# Patient Record
Sex: Male | Born: 1950 | Race: White | Hispanic: No | Marital: Married | Smoking: Current every day smoker
Health system: Southern US, Community
[De-identification: ages and names within clinical notes are randomized; demographics above are authoritative.]

## PROBLEM LIST (undated history)

## (undated) DIAGNOSIS — K279 Peptic ulcer, site unspecified, unspecified as acute or chronic, without hemorrhage or perforation: Secondary | ICD-10-CM

## (undated) DIAGNOSIS — Z8719 Personal history of other diseases of the digestive system: Secondary | ICD-10-CM

## (undated) DIAGNOSIS — E559 Vitamin D deficiency, unspecified: Secondary | ICD-10-CM

## (undated) DIAGNOSIS — I10 Essential (primary) hypertension: Secondary | ICD-10-CM

## (undated) DIAGNOSIS — J449 Chronic obstructive pulmonary disease, unspecified: Secondary | ICD-10-CM

## (undated) DIAGNOSIS — Z87442 Personal history of urinary calculi: Secondary | ICD-10-CM

## (undated) DIAGNOSIS — N200 Calculus of kidney: Secondary | ICD-10-CM

## (undated) DIAGNOSIS — C3490 Malignant neoplasm of unspecified part of unspecified bronchus or lung: Secondary | ICD-10-CM

## (undated) DIAGNOSIS — B019 Varicella without complication: Secondary | ICD-10-CM

## (undated) HISTORY — DX: Essential (primary) hypertension: I10

## (undated) HISTORY — PX: LITHOTRIPSY: SUR834

## (undated) HISTORY — DX: Calculus of kidney: N20.0

## (undated) HISTORY — DX: Peptic ulcer, site unspecified, unspecified as acute or chronic, without hemorrhage or perforation: K27.9

## (undated) HISTORY — DX: Varicella without complication: B01.9

## (undated) HISTORY — DX: Personal history of urinary calculi: Z87.442

## (undated) HISTORY — DX: Personal history of other diseases of the digestive system: Z87.19

---

## 1994-03-08 HISTORY — PX: SPINAL CORD DECOMPRESSION: SHX97

## 2015-05-03 ENCOUNTER — Inpatient Hospital Stay
Admission: EM | Admit: 2015-05-03 | Discharge: 2015-05-05 | DRG: 178 | Disposition: A | Discharge status: Home / Self Care | Payer: PPO | Attending: Internal Medicine | Admitting: Internal Medicine

## 2015-05-03 ENCOUNTER — Emergency Department: Payer: PPO

## 2015-05-03 DIAGNOSIS — R042 Hemoptysis: Secondary | ICD-10-CM | POA: Diagnosis not present

## 2015-05-03 DIAGNOSIS — N289 Disorder of kidney and ureter, unspecified: Secondary | ICD-10-CM | POA: Diagnosis not present

## 2015-05-03 DIAGNOSIS — T380X5A Adverse effect of glucocorticoids and synthetic analogues, initial encounter: Secondary | ICD-10-CM | POA: Diagnosis present

## 2015-05-03 DIAGNOSIS — R739 Hyperglycemia, unspecified: Secondary | ICD-10-CM | POA: Diagnosis present

## 2015-05-03 DIAGNOSIS — F17210 Nicotine dependence, cigarettes, uncomplicated: Secondary | ICD-10-CM | POA: Diagnosis not present

## 2015-05-03 DIAGNOSIS — J690 Pneumonitis due to inhalation of food and vomit: Principal | ICD-10-CM | POA: Diagnosis present

## 2015-05-03 DIAGNOSIS — Z720 Tobacco use: Secondary | ICD-10-CM

## 2015-05-03 DIAGNOSIS — J441 Chronic obstructive pulmonary disease with (acute) exacerbation: Secondary | ICD-10-CM | POA: Diagnosis not present

## 2015-05-03 DIAGNOSIS — J189 Pneumonia, unspecified organism: Secondary | ICD-10-CM | POA: Diagnosis not present

## 2015-05-03 DIAGNOSIS — I10 Essential (primary) hypertension: Secondary | ICD-10-CM | POA: Diagnosis not present

## 2015-05-03 DIAGNOSIS — R0902 Hypoxemia: Secondary | ICD-10-CM | POA: Diagnosis not present

## 2015-05-03 DIAGNOSIS — J440 Chronic obstructive pulmonary disease with acute lower respiratory infection: Secondary | ICD-10-CM | POA: Diagnosis not present

## 2015-05-03 DIAGNOSIS — R58 Hemorrhage, not elsewhere classified: Secondary | ICD-10-CM | POA: Diagnosis not present

## 2015-05-03 HISTORY — DX: Calculus of kidney: N20.0

## 2015-05-03 LAB — CBC
HEMATOCRIT: 46.2 % (ref 40.0–52.0)
Hemoglobin: 15.4 g/dL (ref 13.0–18.0)
MCH: 31.9 pg (ref 26.0–34.0)
MCHC: 33.4 g/dL (ref 32.0–36.0)
MCV: 95.5 fL (ref 80.0–100.0)
Platelets: 162 10*3/uL (ref 150–440)
RBC: 4.83 MIL/uL (ref 4.40–5.90)
RDW: 14.6 % — AB (ref 11.5–14.5)
WBC: 8.3 10*3/uL (ref 3.8–10.6)

## 2015-05-03 LAB — COMPREHENSIVE METABOLIC PANEL
ALBUMIN: 3.7 g/dL (ref 3.5–5.0)
ALT: 8 U/L — ABNORMAL LOW (ref 17–63)
AST: 15 U/L (ref 15–41)
Alkaline Phosphatase: 99 U/L (ref 38–126)
Anion gap: 5 (ref 5–15)
BILIRUBIN TOTAL: 0.1 mg/dL — AB (ref 0.3–1.2)
BUN: 14 mg/dL (ref 6–20)
CHLORIDE: 103 mmol/L (ref 101–111)
CO2: 31 mmol/L (ref 22–32)
Calcium: 8.8 mg/dL — ABNORMAL LOW (ref 8.9–10.3)
Creatinine, Ser: 1.25 mg/dL — ABNORMAL HIGH (ref 0.61–1.24)
GFR calc Af Amer: >60 mL/min (ref >60)
GFR calc non Af Amer: 58 mL/min — ABNORMAL LOW (ref >60)
GLUCOSE: 136 mg/dL — AB (ref 65–99)
POTASSIUM: 4.1 mmol/L (ref 3.5–5.1)
SODIUM: 139 mmol/L (ref 135–145)
Total Protein: 7.4 g/dL (ref 6.5–8.1)

## 2015-05-03 MED ORDER — PIPERACILLIN-TAZOBACTAM 3.375 G IVPB
3.3750 g | Freq: Once | INTRAVENOUS | Status: AC
Start: 2015-05-03 — End: 2015-05-04
  Administered 2015-05-03: 3.375 g via INTRAVENOUS
  Filled 2015-05-03: qty 50

## 2015-05-03 MED ORDER — LABETALOL HCL 5 MG/ML IV SOLN
20.0000 mg | Freq: Once | INTRAVENOUS | Status: AC
  Administered 2015-05-03: 20 mg via INTRAVENOUS
  Filled 2015-05-03: qty 4

## 2015-05-03 MED ORDER — IOHEXOL 350 MG/ML SOLN
75.0000 mL | Freq: Once | INTRAVENOUS | Status: AC | PRN
  Administered 2015-05-03: 75 mL via INTRAVENOUS

## 2015-05-03 NOTE — ED Provider Notes (Signed)
Time Seen: Approximately ----------------------------------------- 7:50 PM on 05/03/2015 -----------------------------------------   I have reviewed the triage notes  Chief Complaint: Hemoptysis   History of Present Illness: Kevin Gutierrez is a 65 y.o. male who had acute onset of hemoptysis today. Patient states approximately one hour prior to arrival he had a coughing spell and coughed up approximately half a cup of clotted blood. Patient denies any chest pain or shortness of breath. He states he's been battling bronchitis on and off for the last several months. Patient denies any fever or chills back or flank discomfort. Patient denies any feelings of lightheadedness, melena or hematochezia. He is currently not on any blood thinners and has really no chronic medical issues. States he was evaluated in another emergency department approximately a year ago and at that time did not have a hemoptysis no persistent bronchitis. She had a CT at that time which was negative and has had negative chest x-rays for any intra-abdominal thoracic tumors etc. He denies any unusual rashes or lesions.   No past medical history on file.  There are no active problems to display for this patient.   No past surgical history on file.  No past surgical history on file.  No current outpatient prescriptions on file.  Allergies:  Review of patient's allergies indicates no known allergies.  Family History: No family history on file.  Social History: Social History  Substance Use Topics  . Smoking status: Not on file  . Smokeless tobacco: Not on file  . Alcohol Use: Not on file     Review of Systems:   10 point review of systems was performed and was otherwise negative:  Constitutional: No fever Eyes: No visual disturbances ENT: No sore throat, ear pain Cardiac: No chest pain Respiratory: No shortness of breath, wheezing, or stridor Abdomen: No abdominal pain, no vomiting, No  diarrhea Endocrine: No weight loss, No night sweats Extremities: No peripheral edema, cyanosis Skin: No rashes, easy bruising Neurologic: No focal weakness, trouble with speech or swollowing Urologic: No dysuria, Hematuria, or urinary frequency   Physical Exam:  ED Triage Vitals  Enc Vitals Group     BP 05/03/15 1907 175/100 mmHg     Pulse Rate 05/03/15 1907 86     Resp 05/03/15 1907 18     Temp 05/03/15 1907 97.8 F (36.6 C)     Temp Source 05/03/15 1907 Oral     SpO2 05/03/15 1907 91 %     Weight 05/03/15 1907 165 lb (74.844 kg)     Height 05/03/15 1907 '5\' 11"'$  (1.803 m)     Head Cir --      Peak Flow --      Pain Score --      Pain Loc --      Pain Edu? --      Excl. in [place]? --     General: Awake , Alert , and Oriented times 3; GCS 15 Head: Normal cephalic , atraumatic Eyes: Pupils equal , round, reactive to light Nose/Throat: No nasal drainage, patent upper airway without erythema or exudate.  Neck: Supple, Full range of motion, No anterior adenopathy or palpable thyroid masses Lungs: Clear to ascultation without wheezes , rhonchi, or rales Heart: Regular rate, regular rhythm without murmurs , gallops , or rubs Abdomen: Soft, non tender without rebound, guarding , or rigidity; bowel sounds positive and symmetric in all 4 quadrants. No organomegaly .        Extremities: 2 plus symmetric pulses.  No edema, clubbing or cyanosis Neurologic: normal ambulation, Motor symmetric without deficits, sensory intact Skin: warm, dry, no rashes   Labs:   All laboratory work was reviewed including any pertinent negatives or positives listed below:  Labs Reviewed  CBC - Abnormal; Notable for the following:    RDW 14.6 (*)    All other components within normal limits  COMPREHENSIVE METABOLIC PANEL      Radiology:      EXAM: CT ANGIOGRAPHY CHEST WITH CONTRAST  TECHNIQUE: Multidetector CT imaging of the chest was performed using the standard protocol during bolus  administration of intravenous contrast. Multiplanar CT image reconstructions and MIPs were obtained to evaluate the vascular anatomy.  CONTRAST: 70m OMNIPAQUE IOHEXOL 350 MG/ML SOLN  COMPARISON: Chest radiograph June 04, 2014  FINDINGS: PULMONARY ARTERY: Adequate contrast opacification of the pulmonary artery's. Main pulmonary artery is not enlarged. No pulmonary arterial filling defects to the level of the subsegmental branches.  MEDIASTINUM: Heart heart size appears normal, no right heart strain. Anterior car deformity due to pectus excavatum. No pericardial effusions. Thoracic aorta is normal course and caliber, mild calcific atherosclerosis. No lymphadenopathy by CT size criteria. 9 mm prevascular lymph node with reniform morphology.  LUNGS: Small amount of debris within the RIGHT lower lobe bronchus. No pneumothorax. Mild bronchial wall thickening. Patchy hypo enhancing consolidation RIGHT middle lobe. Enhancing atelectasis and bilateral lower lobes. Faint central lobular ground-glass densities bilateral lower lobes lobe. Mild centrilobular emphysema. No pleural effusion.  SOFT TISSUES AND OSSEOUS STRUCTURES: Included view of the abdomen is unremarkable. Visualized soft tissues and included osseous structures appear normal.  Review of the MIP images confirms the above findings.  IMPRESSION: No acute pulmonary embolism.  Bronchial wall thickening can be seen with reactive airway disease or bronchitis. Small amount of debris in RIGHT lower lobe bronchus, possible aspiration.  RIGHT middle lobe probable pneumonia, with bilateral lower lobe ground-glass densities which may be infectious or inflammatory.    I personally reviewed the radiologic studies     ED Course:  \Patient had a repeat episode of coughing up approximately a handful of clotted blood with hemoptysis. Patient's also moderately hypoxic and was started on some nonrebreather. His sats have  improved gradually. His chest CT does not show any indications of a pulmonary embolism or cancer which would be of concern with the patient's tobacco history. He does appear to have some indications of community-acquired pneumonia so blood cultures 2 was obtained the patient was started on IV antibiotic therapy. May require bronchoscopy to find source of his hemoptysis. He is otherwise hemodynamically stable    Assessment: * Hemoptysis Community-acquired pneumonia Hypoxemia   Final Clinical Impression:  Final diagnoses:  Hemoptysis     Plan:  Inpatient management I spoke to the hospitalist team, further disposition and management depends upon their evaluation.           BDaymon Larsen MD 05/03/15 2217

## 2015-05-03 NOTE — ED Notes (Signed)
Pt presents to ED via EMS with c/o of coughing up blood and "lots of sneezing". Pt states it was a handful of blood over a 15 minutes period."it was red and clots".

## 2015-05-03 NOTE — H&P (Signed)
[facility] [place] NAME: Kevin Gutierrez    MR#:  [id number]  DATE OF BIRTH:  1950/05/22  DATE OF ADMISSION:  05/03/2015  PRIMARY CARE PHYSICIAN: No primary care provider on file.   REQUESTING/REFERRING PHYSICIAN:   CHIEF COMPLAINT:   Chief Complaint  Patient presents with  . Hemoptysis    HISTORY OF PRESENT ILLNESS: Kevin Gutierrez  is a 65 y.o. male with a known history of nephrolithiasis presented to the emergency room with difficulty breathing and coughing of blood. The above complaint started today. She had 3 episodes of coughing up blood. It was bright red blood. Patient also complains of having cough for the last 1-2 weeks. No history of any fever. No history of any night sweats. This patient does not say whether he had any weight loss. Patient is an active smoker for most 1 pack a day for the last 50 years. No complaints of any chest pain. Vision was put on oxygen via nonrebreather mask in the emergency room. Was worked up with a CT chest which showed  bilateral pneumonia and groundglass densities. No history of any travel. Patient says he did not have any history of TB in the past. Hospitalist service was consulted for further care of the patient.  PAST MEDICAL HISTORY:   Past Medical History  Diagnosis Date  . Nephrolithiasis     PAST SURGICAL HISTORY: Past Surgical History  Procedure Laterality Date  . Lithotripsy      SOCIAL HISTORY:  Social History  Substance Use Topics  . Smoking status: Current Every Day Smoker -- 1.00 packs/day for 50 years    Types: Cigarettes  . Smokeless tobacco: Not on file  . Alcohol Use: No    FAMILY HISTORY:  Family History  Problem Relation Age of Onset  . Diabetes Mellitus II Brother     DRUG ALLERGIES: No Known Allergies  REVIEW OF SYSTEMS:   CONSTITUTIONAL: No fever, fatigue or weakness.  EYES: No blurred or double vision.  EARS, NOSE, AND THROAT: No tinnitus or ear pain.   RESPIRATORY: cough present, shortness of breath present, no wheezing , has hemoptysis.  CARDIOVASCULAR: No chest pain, orthopnea, edema.  GASTROINTESTINAL: No nausea, vomiting, diarrhea or abdominal pain.  GENITOURINARY: No dysuria, hematuria.  ENDOCRINE: No polyuria, nocturia,  HEMATOLOGY: No anemia, easy bruising or bleeding SKIN: No rash or lesion. MUSCULOSKELETAL: No joint pain or arthritis.   NEUROLOGIC: No tingling, numbness, weakness.  PSYCHIATRY: No anxiety or depression.   MEDICATIONS AT HOME:  Prior to Admission medications   Medication Sig Start Date End Date Taking? Authorizing Provider  acetaminophen (TYLENOL) 325 MG tablet Take 650 mg by mouth every 6 (six) hours as needed for moderate pain, fever or headache.   Yes Historical Provider, MD      PHYSICAL EXAMINATION:   VITAL SIGNS: Blood pressure 166/82, pulse 79, temperature 97.8 F (36.6 C), temperature source Oral, resp. rate 23, height '5\' 11"'$  (1.803 m), weight 74.844 kg (165 lb), SpO2 98 %.  GENERAL:  65 y.o.-year-old patient lying in the bed in mild distress. EYES: Pupils equal, round, reactive to light and accommodation. No scleral icterus. Extraocular muscles intact.  HEENT: Head atraumatic, normocephalic. Oropharynx and nasopharynx clear.  NECK:  Supple, no jugular venous distention. No thyroid enlargement, no tenderness.  LUNGS: decreased breath sounds bilaterally, no wheezing, scattered rales heard in both lungs,no rhonchi or crepitation. No use of accessory muscles of respiration.  CARDIOVASCULAR: S1, S2 normal. No  murmurs, rubs, or gallops.  ABDOMEN: Soft, nontender, nondistended. Bowel sounds present. No organomegaly or mass.  EXTREMITIES: No pedal edema, cyanosis, or clubbing.  NEUROLOGIC: Cranial nerves II through XII are intact. Muscle strength 5/5 in all extremities. Sensation intact. Gait not checked.  PSYCHIATRIC: The patient is alert and oriented x 3.  SKIN: No obvious rash, lesion, or ulcer.    LABORATORY PANEL:   CBC  Recent Labs Lab 05/03/15 1912  WBC 8.3  HGB 15.4  HCT 46.2  PLT 162  MCV 95.5  MCH 31.9  MCHC 33.4  RDW 14.6*   ------------------------------------------------------------------------------------------------------------------  Chemistries   Recent Labs Lab 05/03/15 1912  NA 139  K 4.1  CL 103  CO2 31  GLUCOSE 136*  BUN 14  CREATININE 1.25*  CALCIUM 8.8*  AST 15  ALT 8*  ALKPHOS 99  BILITOT 0.1*   ------------------------------------------------------------------------------------------------------------------ estimated creatinine clearance is 61.5 mL/min (by C-G formula based on Cr of 1.25). ------------------------------------------------------------------------------------------------------------------ No results for input(s): TSH, T4TOTAL, T3FREE, THYROIDAB in the last 72 hours.  Invalid input(s): FREET3   Coagulation profile No results for input(s): INR, PROTIME in the last 168 hours. ------------------------------------------------------------------------------------------------------------------- No results for input(s): DDIMER in the last 72 hours. -------------------------------------------------------------------------------------------------------------------  Cardiac Enzymes No results for input(s): CKMB, TROPONINI, MYOGLOBIN in the last 168 hours.  Invalid input(s): CK ------------------------------------------------------------------------------------------------------------------ Invalid input(s): POCBNP  ---------------------------------------------------------------------------------------------------------------  Urinalysis No results found for: COLORURINE, APPEARANCEUR, LABSPEC, PHURINE, GLUCOSEU, HGBUR, BILIRUBINUR, KETONESUR, PROTEINUR, UROBILINOGEN, NITRITE, LEUKOCYTESUR   RADIOLOGY: Ct Angio Chest Pe W/cm &/or Wo Cm  05/03/2015  CLINICAL DATA:  Hemoptysis, sneezing. Recent upper respiratory tract  infection. EXAM: CT ANGIOGRAPHY CHEST WITH CONTRAST TECHNIQUE: Multidetector CT imaging of the chest was performed using the standard protocol during bolus administration of intravenous contrast. Multiplanar CT image reconstructions and MIPs were obtained to evaluate the vascular anatomy. CONTRAST:  34m OMNIPAQUE IOHEXOL 350 MG/ML SOLN COMPARISON:  Chest radiograph June 04, 2014 FINDINGS: PULMONARY ARTERY: Adequate contrast opacification of the pulmonary artery's. Main pulmonary artery is not enlarged. No pulmonary arterial filling defects to the level of the subsegmental branches. MEDIASTINUM: Heart heart size appears normal, no right heart strain. Anterior car deformity due to pectus excavatum. No pericardial effusions. Thoracic aorta is normal course and caliber, mild calcific atherosclerosis. No lymphadenopathy by CT size criteria. 9 mm prevascular lymph node with reniform morphology. LUNGS: Small amount of debris within the RIGHT lower lobe bronchus. No pneumothorax. Mild bronchial wall thickening. Patchy hypo enhancing consolidation RIGHT middle lobe. Enhancing atelectasis and bilateral lower lobes. Faint central lobular ground-glass densities bilateral lower lobes lobe. Mild centrilobular emphysema. No pleural effusion. SOFT TISSUES AND OSSEOUS STRUCTURES: Included view of the abdomen is unremarkable. Visualized soft tissues and included osseous structures appear normal. Review of the MIP images confirms the above findings. IMPRESSION: No acute pulmonary embolism. Bronchial wall thickening can be seen with reactive airway disease or bronchitis. Small amount of debris in RIGHT lower lobe bronchus, possible aspiration. RIGHT middle lobe probable pneumonia, with bilateral lower lobe ground-glass densities which may be infectious or inflammatory. Electronically Signed   By: CElon AlasM.D.   On: 05/03/2015 21:46    EKG: Orders placed or performed during the hospital encounter of 05/03/15  . ED  EKG  . ED EKG    IMPRESSION AND PLAN: 65year old male patient with history of nephrolithiasis presented to the emergency room with coughing of blood and shortness of breath. Admitting diagnosis 1. Hemoptysis 2. Aspiration pneumonia 3. Hypoxia 4. Active tobacco abuse 5.  Bilateral pneumonia Treatment plan Admit patient to stepdown unit Start patient on IV vancomycin and IV Zosyn antibiotics Oxygen supply by nonrebreather mask Pulmonology consult Sputum for AFB 3 Air bone isolation Hold blood thinner meds.  All the records are reviewed and case discussed with ED provider. Management plans discussed with the patient, family and they are in agreement.  CODE STATUS:FULL    TOTAL TIME TAKING CARE OF THIS PATIENT: 48 minutes.    Saundra Shelling M.D on 05/03/2015 at 11:53 PM  Between 7am to 6pm - Pager - [phone]  After 6pm go to [email or web address] - password [email or web address] [facility]  [facility]  Office  [phone]  CC: Primary care physician; No primary care provider on file.

## 2015-05-03 NOTE — ED Notes (Signed)
Pt placed on airborne precautions.

## 2015-05-03 NOTE — ED Notes (Signed)
Patient transported to CT 

## 2015-05-04 DIAGNOSIS — R042 Hemoptysis: Secondary | ICD-10-CM | POA: Diagnosis not present

## 2015-05-04 DIAGNOSIS — J690 Pneumonitis due to inhalation of food and vomit: Secondary | ICD-10-CM | POA: Diagnosis not present

## 2015-05-04 DIAGNOSIS — J189 Pneumonia, unspecified organism: Secondary | ICD-10-CM | POA: Diagnosis not present

## 2015-05-04 DIAGNOSIS — J441 Chronic obstructive pulmonary disease with (acute) exacerbation: Secondary | ICD-10-CM

## 2015-05-04 DIAGNOSIS — N289 Disorder of kidney and ureter, unspecified: Secondary | ICD-10-CM | POA: Diagnosis not present

## 2015-05-04 DIAGNOSIS — Z720 Tobacco use: Secondary | ICD-10-CM | POA: Diagnosis not present

## 2015-05-04 LAB — URINALYSIS COMPLETE WITH MICROSCOPIC (ARMC ONLY)
BACTERIA UA: NONE SEEN
Bilirubin Urine: NEGATIVE
Glucose, UA: NEGATIVE mg/dL
KETONES UR: NEGATIVE mg/dL
Leukocytes, UA: NEGATIVE
Nitrite: NEGATIVE
PH: 5 (ref 5.0–8.0)
PROTEIN: NEGATIVE mg/dL
SPECIFIC GRAVITY, URINE: 1.011 (ref 1.005–1.030)
Squamous Epithelial / LPF: NONE SEEN

## 2015-05-04 LAB — CBC
HCT: 43.8 % (ref 40.0–52.0)
HEMOGLOBIN: 14.4 g/dL (ref 13.0–18.0)
MCH: 31.1 pg (ref 26.0–34.0)
MCHC: 32.9 g/dL (ref 32.0–36.0)
MCV: 94.5 fL (ref 80.0–100.0)
PLATELETS: 166 10*3/uL (ref 150–440)
RBC: 4.63 MIL/uL (ref 4.40–5.90)
RDW: 14.3 % (ref 11.5–14.5)
WBC: 9 10*3/uL (ref 3.8–10.6)

## 2015-05-04 LAB — EXPECTORATED SPUTUM ASSESSMENT W REFEX TO RESP CULTURE

## 2015-05-04 LAB — BASIC METABOLIC PANEL
ANION GAP: 9 (ref 5–15)
BUN: 17 mg/dL (ref 6–20)
CALCIUM: 8.8 mg/dL — AB (ref 8.9–10.3)
CO2: 29 mmol/L (ref 22–32)
CREATININE: 1.31 mg/dL — AB (ref 0.61–1.24)
Chloride: 101 mmol/L (ref 101–111)
GFR, EST NON AFRICAN AMERICAN: 55 mL/min — AB (ref >60)
Glucose, Bld: 209 mg/dL — ABNORMAL HIGH (ref 65–99)
Potassium: 4.5 mmol/L (ref 3.5–5.1)
SODIUM: 139 mmol/L (ref 135–145)

## 2015-05-04 LAB — EXPECTORATED SPUTUM ASSESSMENT W GRAM STAIN, RFLX TO RESP C

## 2015-05-04 LAB — PROTIME-INR
INR: 0.99
PROTHROMBIN TIME: 13.3 s (ref 11.4–15.0)

## 2015-05-04 LAB — MRSA PCR SCREENING: MRSA by PCR: NEGATIVE

## 2015-05-04 LAB — GLUCOSE, CAPILLARY: GLUCOSE-CAPILLARY: 144 mg/dL — AB (ref 65–99)

## 2015-05-04 LAB — HEMOGLOBIN A1C: Hgb A1c MFr Bld: 5.8 % (ref 4.0–6.0)

## 2015-05-04 MED ORDER — SODIUM CHLORIDE 0.9 % IV SOLN
INTRAVENOUS | Status: DC
  Administered 2015-05-04 – 2015-05-05 (×2): via INTRAVENOUS

## 2015-05-04 MED ORDER — AZITHROMYCIN 250 MG PO TABS
500.0000 mg | ORAL_TABLET | Freq: Every day | ORAL | Status: AC
  Administered 2015-05-04: 500 mg via ORAL
  Filled 2015-05-04: qty 2

## 2015-05-04 MED ORDER — ALBUTEROL SULFATE HFA 108 (90 BASE) MCG/ACT IN AERS: 2.0000 | INHALATION_SPRAY | RESPIRATORY_TRACT | Status: DC | PRN

## 2015-05-04 MED ORDER — TIOTROPIUM BROMIDE MONOHYDRATE 18 MCG IN CAPS
18.0000 ug | ORAL_CAPSULE | Freq: Every day | RESPIRATORY_TRACT | Status: DC
  Administered 2015-05-04 – 2015-05-05 (×2): 18 ug via RESPIRATORY_TRACT
  Filled 2015-05-04: qty 5

## 2015-05-04 MED ORDER — VANCOMYCIN HCL IN DEXTROSE 1-5 GM/200ML-% IV SOLN
1000.0000 mg | Freq: Two times a day (BID) | INTRAVENOUS | Status: DC
  Administered 2015-05-04: 1000 mg via INTRAVENOUS
  Filled 2015-05-04 (×3): qty 200

## 2015-05-04 MED ORDER — VANCOMYCIN HCL IN DEXTROSE 1-5 GM/200ML-% IV SOLN
INTRAVENOUS | Status: AC
  Administered 2015-05-04: 1000 mg via INTRAVENOUS
  Filled 2015-05-04: qty 200

## 2015-05-04 MED ORDER — PREDNISONE 50 MG PO TABS
50.0000 mg | ORAL_TABLET | Freq: Every day | ORAL | Status: DC
  Administered 2015-05-05: 50 mg via ORAL
  Filled 2015-05-04: qty 1

## 2015-05-04 MED ORDER — PIPERACILLIN-TAZOBACTAM 3.375 G IVPB
3.3750 g | Freq: Three times a day (TID) | INTRAVENOUS | Status: DC
Start: 2015-05-04 — End: 2015-05-04
  Administered 2015-05-04: 3.375 g via INTRAVENOUS
  Filled 2015-05-04 (×3): qty 50

## 2015-05-04 MED ORDER — DEXTROSE 5 % IV SOLN
1.0000 g | INTRAVENOUS | Status: DC
  Administered 2015-05-04 – 2015-05-05 (×2): 1 g via INTRAVENOUS
  Filled 2015-05-04 (×2): qty 10

## 2015-05-04 MED ORDER — DEXTROSE-NACL 5-0.9 % IV SOLN
INTRAVENOUS | Status: DC
  Administered 2015-05-04: 05:00:00 via INTRAVENOUS

## 2015-05-04 MED ORDER — ONDANSETRON HCL 4 MG/2ML IJ SOLN: 4.0000 mg | Freq: Four times a day (QID) | INTRAMUSCULAR | Status: DC | PRN

## 2015-05-04 MED ORDER — MOMETASONE FURO-FORMOTEROL FUM 200-5 MCG/ACT IN AERO
2.0000 | INHALATION_SPRAY | Freq: Two times a day (BID) | RESPIRATORY_TRACT | Status: DC
  Administered 2015-05-04 – 2015-05-05 (×3): 2 via RESPIRATORY_TRACT
  Filled 2015-05-04: qty 8.8

## 2015-05-04 MED ORDER — AZITHROMYCIN 250 MG PO TABS
250.0000 mg | ORAL_TABLET | Freq: Every day | ORAL | Status: DC
  Administered 2015-05-05: 250 mg via ORAL
  Filled 2015-05-04: qty 1

## 2015-05-04 MED ORDER — ACETAMINOPHEN 650 MG RE SUPP: 650.0000 mg | Freq: Four times a day (QID) | RECTAL | Status: DC | PRN

## 2015-05-04 MED ORDER — VANCOMYCIN HCL IN DEXTROSE 1-5 GM/200ML-% IV SOLN
1000.0000 mg | Freq: Once | INTRAVENOUS | Status: AC
  Administered 2015-05-04: 1000 mg via INTRAVENOUS

## 2015-05-04 MED ORDER — ACETAMINOPHEN 325 MG PO TABS: 650.0000 mg | ORAL_TABLET | Freq: Four times a day (QID) | ORAL | Status: DC | PRN

## 2015-05-04 MED ORDER — AMLODIPINE BESYLATE 5 MG PO TABS
5.0000 mg | ORAL_TABLET | Freq: Every day | ORAL | Status: DC
  Administered 2015-05-04 – 2015-05-05 (×2): 5 mg via ORAL
  Filled 2015-05-04 (×2): qty 1

## 2015-05-04 MED ORDER — ALBUTEROL SULFATE (2.5 MG/3ML) 0.083% IN NEBU: 2.5000 mg | INHALATION_SOLUTION | RESPIRATORY_TRACT | Status: DC | PRN

## 2015-05-04 MED ORDER — SODIUM CHLORIDE 0.9 % IJ SOLN
3.0000 mL | Freq: Two times a day (BID) | INTRAMUSCULAR | Status: DC
  Administered 2015-05-04 – 2015-05-05 (×4): 3 mL via INTRAVENOUS

## 2015-05-04 MED ORDER — PREDNISONE 20 MG PO TABS
20.0000 mg | ORAL_TABLET | Freq: Every day | ORAL | Status: DC
  Administered 2015-05-04: 12:00:00 20 mg via ORAL
  Filled 2015-05-04: qty 1

## 2015-05-04 MED ORDER — ONDANSETRON HCL 4 MG PO TABS: 4.0000 mg | ORAL_TABLET | Freq: Four times a day (QID) | ORAL | Status: DC | PRN

## 2015-05-04 NOTE — Progress Notes (Signed)
ANTIBIOTIC CONSULT NOTE - INITIAL  Pharmacy Consult for vancomycin and Zosyn dosing Indication: pneumonia  No Known Allergies  Patient Measurements: Height: '5\' 11"'$  (180.3 cm) Weight: 165 lb (74.844 kg) IBW/kg (Calculated) : 75.3 Adjusted Body Weight: 74.8kg  Vital Signs: Temp: 98.6 F (37 C) (01/10 0335) Temp Source: Oral (01/10 0335) BP: 153/91 mmHg (01/10 0335) Pulse Rate: 68 (01/10 0335) Intake/Output from previous day: 01/09 0701 - 01/10 0700 In: 200 [IV Piggyback:200] Out: -  Intake/Output from this shift: Total I/O In: 200 [IV Piggyback:200] Out: -   Labs:  Recent Labs  05/03/15 1912  WBC 8.3  HGB 15.4  PLT 162  CREATININE 1.25*   Estimated Creatinine Clearance: 61.5 mL/min (by C-G formula based on Cr of 1.25). No results for input(s): VANCOTROUGH, VANCOPEAK, VANCORANDOM, GENTTROUGH, GENTPEAK, GENTRANDOM, TOBRATROUGH, TOBRAPEAK, TOBRARND, AMIKACINPEAK, AMIKACINTROU, AMIKACIN in the last 72 hours.   Microbiology: No results found for this or any previous visit (from the past 720 hour(s)).  Medical History: Past Medical History  Diagnosis Date  . Nephrolithiasis     Medications:   Assessment: Blood cx pending CT: probable R mid lobe pneumonia  Goal of Therapy:  Vancomycin trough level 15-20 mcg/ml  Plan:  TBW 74.8kg  IBW 75.3kg  DW 74.8k  Vd 52L kei 0.056 hr-1  T1/2 12 hours Vancomycin 1 gram q 12 hours ordered with stacked dosing. Level before 5th dose.  Zosyn 3.375 grams q 8 hours ordered.  Brazen Domangue S 05/04/2015,4:09 AM

## 2015-05-04 NOTE — Progress Notes (Signed)
[facility] [place] NAME: Kevin Gutierrez    MR#:  [id number]  DATE OF BIRTH:  [birth year]  SUBJECTIVE:  CHIEF COMPLAINT:   Chief Complaint  Patient presents with  . Hemoptysis   patient is a 65 year old Caucasian male with past nuchal history significant for history of COPD who presents to the hospital with complaints of cough, shortness of breath and hemoptysis which started yesterday. Patient still smokes. CT of the chest was performed which revealed pneumonia and patient was admitted to the hospital for further evaluation and treatment. He feels good today. Has no more hemoptysis. The patient has been receiving, seen and Zosyn as well as Zithromax for pneumonia  Review of Systems  Constitutional: Negative for fever, chills and weight loss.  HENT: Negative for congestion.   Eyes: Negative for blurred vision and double vision.  Respiratory: Positive for cough, hemoptysis, shortness of breath and wheezing. Negative for sputum production.   Cardiovascular: Negative for chest pain, palpitations, orthopnea, leg swelling and PND.  Gastrointestinal: Negative for nausea, vomiting, abdominal pain, diarrhea, constipation and blood in stool.  Genitourinary: Negative for dysuria, urgency, frequency and hematuria.  Musculoskeletal: Negative for falls.  Neurological: Negative for dizziness, tremors, focal weakness and headaches.  Endo/Heme/Allergies: Does not bruise/bleed easily.  Psychiatric/Behavioral: Negative for depression. The patient does not have insomnia.     VITAL SIGNS: Blood pressure 170/67, pulse 77, temperature 97.5 F (36.4 C), temperature source Oral, resp. rate 20, height '5\' 11"'$  (1.803 m), weight 74.844 kg (165 lb), SpO2 96 %.  PHYSICAL EXAMINATION:   GENERAL:  65 y.o.-year-old patient lying in the bed with no acute distress.  EYES: Pupils equal, round, reactive to light and accommodation. No scleral icterus. Extraocular muscles  intact.  HEENT: Head atraumatic, normocephalic. Oropharynx and nasopharynx clear.  NECK:  Supple, no jugular venous distention. No thyroid enlargement, no tenderness.  LUNGS: Diminished breath sounds bilaterally, scattered wheezing, rales,rhonchi or right side posterior crepitations. No use of accessory muscles of respiration.  CARDIOVASCULAR: S1, S2 normal. No murmurs, rubs, or gallops.  ABDOMEN: Soft, nontender, nondistended. Bowel sounds present. No organomegaly or mass.  EXTREMITIES: No pedal edema, cyanosis, or clubbing.  NEUROLOGIC: Cranial nerves II through XII are intact. Muscle strength 5/5 in all extremities. Sensation intact. Gait not checked.  PSYCHIATRIC: The patient is alert and oriented x 3.  SKIN: No obvious rash, lesion, or ulcer.   ORDERS/RESULTS REVIEWED:   CBC  Recent Labs Lab 05/03/15 1912 05/04/15 0922  WBC 8.3 9.0  HGB 15.4 14.4  HCT 46.2 43.8  PLT 162 166  MCV 95.5 94.5  MCH 31.9 31.1  MCHC 33.4 32.9  RDW 14.6* 14.3   ------------------------------------------------------------------------------------------------------------------  Chemistries   Recent Labs Lab 05/03/15 1912 05/04/15 0922  NA 139 139  K 4.1 4.5  CL 103 101  CO2 31 29  GLUCOSE 136* 209*  BUN 14 17  CREATININE 1.25* 1.31*  CALCIUM 8.8* 8.8*  AST 15  --   ALT 8*  --   ALKPHOS 99  --   BILITOT 0.1*  --    ------------------------------------------------------------------------------------------------------------------ estimated creatinine clearance is 58.7 mL/min (by C-G formula based on Cr of 1.31). ------------------------------------------------------------------------------------------------------------------ No results for input(s): TSH, T4TOTAL, T3FREE, THYROIDAB in the last 72 hours.  Invalid input(s): FREET3  Cardiac Enzymes No results for input(s): CKMB, TROPONINI, MYOGLOBIN in the last 168 hours.  Invalid input(s):  CK ------------------------------------------------------------------------------------------------------------------ Invalid input(s): POCBNP ---------------------------------------------------------------------------------------------------------------  RADIOLOGY: Ct Angio Chest Pe W/cm &/or  Wo Cm  05/03/2015  CLINICAL DATA:  Hemoptysis, sneezing. Recent upper respiratory tract infection. EXAM: CT ANGIOGRAPHY CHEST WITH CONTRAST TECHNIQUE: Multidetector CT imaging of the chest was performed using the standard protocol during bolus administration of intravenous contrast. Multiplanar CT image reconstructions and MIPs were obtained to evaluate the vascular anatomy. CONTRAST:  35m OMNIPAQUE IOHEXOL 350 MG/ML SOLN COMPARISON:  Chest radiograph June 04, 2014 FINDINGS: PULMONARY ARTERY: Adequate contrast opacification of the pulmonary artery's. Main pulmonary artery is not enlarged. No pulmonary arterial filling defects to the level of the subsegmental branches. MEDIASTINUM: Heart heart size appears normal, no right heart strain. Anterior car deformity due to pectus excavatum. No pericardial effusions. Thoracic aorta is normal course and caliber, mild calcific atherosclerosis. No lymphadenopathy by CT size criteria. 9 mm prevascular lymph node with reniform morphology. LUNGS: Small amount of debris within the RIGHT lower lobe bronchus. No pneumothorax. Mild bronchial wall thickening. Patchy hypo enhancing consolidation RIGHT middle lobe. Enhancing atelectasis and bilateral lower lobes. Faint central lobular ground-glass densities bilateral lower lobes lobe. Mild centrilobular emphysema. No pleural effusion. SOFT TISSUES AND OSSEOUS STRUCTURES: Included view of the abdomen is unremarkable. Visualized soft tissues and included osseous structures appear normal. Review of the MIP images confirms the above findings. IMPRESSION: No acute pulmonary embolism. Bronchial wall thickening can be seen with reactive airway  disease or bronchitis. Small amount of debris in RIGHT lower lobe bronchus, possible aspiration. RIGHT middle lobe probable pneumonia, with bilateral lower lobe ground-glass densities which may be infectious or inflammatory. Electronically Signed   By: CElon AlasM.D.   On: 05/03/2015 21:46    EKG:  Orders placed or performed during the hospital encounter of 05/03/15  . ED EKG  . ED EKG    ASSESSMENT AND PLAN:  Principal Problem:   Hemoptysis Active Problems:   Hypoxia   Aspiration pneumonia (HCC)   Pneumonia 1. COPD exacerbation due to pneumonia, continue patient on duo nebs , Dulera, broad-spectrum antibiotics, tiotropium, initiate steroids 3. Right middle lobe pneumonia with hemoptysis, continue antibiotics, follow sputum cultures, pulmonologist does not feel that patient has TB,  isolation has been discontinued 3. Renal insufficiency, seen to be stable, possibly chronic, initiated on low rate IV fluids, follow creatinine in the morning, get urinalysis 4. Hyperglycemia, likely due to steroids, get hemoglobin A1c to rule out diabetes 5. Tobacco abuse, well discuss  with patient . Nicotine replacement therapy to be initiated   Management plans discussed with the patient, family and they are in agreement.   DRUG ALLERGIES: No Known Allergies  CODE STATUS:     Code Status Orders        Start     Ordered   05/04/15 0341  Full code   Continuous     05/04/15 0340    Code Status History    Date Active Date Inactive Code Status Order ID Comments User Context   This patient has a current code status but no historical code status.      TOTAL TIME TAKING CARE OF THIS PATIENT: 40 minutes.    VTheodoro GristM.D on 05/04/2015 at 12:50 PM  Between 7am to 6pm - Pager - [phone]  After 6pm go to [email or web address] - password [email or web address] [facility]  Office  [phone] CC: Primary care physician; No primary care provider on file.

## 2015-05-04 NOTE — ED Notes (Signed)
Pt with steady gait to restroom. Pt denies any dizziness or lightheadedness.

## 2015-05-04 NOTE — Consult Note (Signed)
[facility] Consultation      Date: 05/04/2015,   MRN# [id number] Kevin Gutierrez 05/27/1950 Code Status:     Code Status Orders        Start     Ordered   05/04/15 0341  Full code   Continuous     05/04/15 0340     Hosp day:'@LENGTHOFSTAYDAYS'$ @ Referring MD: '@ATDPROV'$ @     PCP:      AdmissionWeight: 165 lb (74.844 kg)                 CurrentWeight: 165 lb (74.844 kg) Kevin Gutierrez is a 65 y.o. old male seen in consultation for hemoptysis the request of Dr Estanislado Pandy     CHIEF COMPLAINT:   Acute coughing up blood   HISTORY OF PRESENT ILLNESS  65 y.o. White male with a long standing smoking histroy 50 pack year, presented to the emergency room with difficulty breathing and coughing of blood approx 1 cup full. Started 5 PM yesterday after dinner. It was bright red blood.  -Patient also complains of having cough for the last 1-2 weeks. And actually dating back to March of last year.  -No history of any fever. No history of any night sweats. 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  - No complaints of any chest pain -he was placed on nonrebreather mask in the emergency room-now on 3L [place] 02sat 93% -. Was worked up with a CT chest which showed bilateral pneumonia and groundglass densities.  -No history of any travel - Patient says he did not have any history of TB in the past.    PAST MEDICAL HISTORY   Past Medical History  Diagnosis Date  . Nephrolithiasis      SURGICAL HISTORY   Past Surgical History  Procedure Laterality Date  . Lithotripsy       FAMILY HISTORY   Family History  Problem Relation Age of Onset  . Diabetes Mellitus II Brother      SOCIAL HISTORY   Social History  Substance Use Topics  . Smoking status: Current Every Day Smoker -- 1.00 packs/day for 50 years    Types: Cigarettes  . Smokeless tobacco: None  . Alcohol Use: No     MEDICATIONS    Home Medication:  No current  outpatient prescriptions on file.  Current Medication:  Current facility-administered medications:  .  acetaminophen (TYLENOL) tablet 650 mg, 650 mg, Oral, Q6H PRN **OR** acetaminophen (TYLENOL) suppository 650 mg, 650 mg, Rectal, Q6H PRN, Pavan Pyreddy, MD .  dextrose 5 %-0.9 % sodium chloride infusion, , Intravenous, Continuous, Pavan Pyreddy, MD, Last Rate: 100 mL/hr at 05/04/15 0525 .  ondansetron (ZOFRAN) tablet 4 mg, 4 mg, Oral, Q6H PRN **OR** ondansetron (ZOFRAN) injection 4 mg, 4 mg, Intravenous, Q6H PRN, Pavan Pyreddy, MD .  piperacillin-tazobactam (ZOSYN) IVPB 3.375 g, 3.375 g, Intravenous, 3 times per day, Saundra Shelling, MD, 3.375 g at 05/04/15 0525 .  sodium chloride 0.9 % injection 3 mL, 3 mL, Intravenous, Q12H, Pavan Pyreddy, MD, 3 mL at 05/04/15 0345 .  vancomycin (VANCOCIN) IVPB 1000 mg/200 mL premix, 1,000 mg, Intravenous, Q12H, Pavan Pyreddy, MD    ALLERGIES   Review of patient's allergies indicates no known allergies.     REVIEW OF SYSTEMS   Review of Systems  Constitutional: Negative for fever, chills, weight loss and malaise/fatigue.  Eyes: Negative for blurred vision.  Respiratory: Positive for cough, hemoptysis and sputum production. Negative  for shortness of breath and wheezing.   Cardiovascular: Negative for chest pain, palpitations and orthopnea.  Gastrointestinal: Negative for nausea, vomiting and abdominal pain.  Musculoskeletal: Negative.   Skin: Negative for rash.  Neurological: Negative for dizziness and headaches.  Psychiatric/Behavioral: The patient is nervous/anxious.      VS: BP 173/93 mmHg  Pulse 73  Temp(Src) 98.6 F (37 C) (Oral)  Resp 25  Ht '5\' 11"'$  (1.803 m)  Wt 165 lb (74.844 kg)  BMI 23.02 kg/m2  SpO2 93%     PHYSICAL EXAM  Physical Exam  Constitutional: He is oriented to person, place, and time. He appears well-developed and well-nourished. No distress.  HENT:  Head: Normocephalic and atraumatic.  Mouth/Throat: No  oropharyngeal exudate.  Eyes: EOM are normal. Pupils are equal, round, and reactive to light. No scleral icterus.  Neck: Normal range of motion. Neck supple.  Cardiovascular: Normal rate, regular rhythm and normal heart sounds.   No murmur heard. Pulmonary/Chest: Effort normal and breath sounds normal. No respiratory distress. He has no wheezes. He has no rales.  Abdominal: Soft. Bowel sounds are normal.  Musculoskeletal: Normal range of motion. He exhibits no edema.  Neurological: He is alert and oriented to person, place, and time.  Skin: Skin is warm. He is not diaphoretic.        LABS    Recent Labs     05/03/15  1912  HGB  15.4  HCT  46.2  MCV  95.5  WBC  8.3  BUN  14  CREATININE  1.25*  GLUCOSE  136*  CALCIUM  8.8*  ,    No results for input(s): PH in the last 72 hours.  Invalid input(s): PCO2, PO2, BASEEXCESS, BASEDEFICITE, TFT    CULTURE RESULTS   Recent Results (from the past 240 hour(s))  Culture, blood (Routine X 2) w Reflex to ID Panel     Status: None (Preliminary result)   Collection Time: 05/03/15 10:16 PM  Result Value Ref Range Status   Specimen Description BLOOD LEFT ANTECUBITAL  Final   Special Requests   Final    BOTTLES DRAWN AEROBIC AND ANAEROBIC 5MLANAEROBIC, 10MLAEROBIC   Culture NO GROWTH < 12 HOURS  Final   Report Status PENDING  Incomplete  Culture, blood (Routine X 2) w Reflex to ID Panel     Status: None (Preliminary result)   Collection Time: 05/03/15 10:30 PM  Result Value Ref Range Status   Specimen Description BLOOD LEFT ARM  Final   Special Requests BOTTLES DRAWN AEROBIC AND ANAEROBIC 5ML  Final   Culture NO GROWTH < 12 HOURS  Final   Report Status PENDING  Incomplete  MRSA PCR Screening     Status: None   Collection Time: 05/04/15  3:42 AM  Result Value Ref Range Status   MRSA by PCR NEGATIVE NEGATIVE Final    Comment:        The GeneXpert MRSA Assay (FDA approved for NASAL specimens only), is one component of  a comprehensive MRSA colonization surveillance program. It is not intended to diagnose MRSA infection nor to guide or monitor treatment for MRSA infections.           IMAGING    Ct Angio Chest Pe W/cm &/or Wo Cm  05/03/2015  CLINICAL DATA:  Hemoptysis, sneezing. Recent upper respiratory tract infection. EXAM: CT ANGIOGRAPHY CHEST WITH CONTRAST TECHNIQUE: Multidetector CT imaging of the chest was performed using the standard protocol during bolus administration of intravenous contrast. Multiplanar CT image  reconstructions and MIPs were obtained to evaluate the vascular anatomy. CONTRAST:  66m OMNIPAQUE IOHEXOL 350 MG/ML SOLN COMPARISON:  Chest radiograph June 04, 2014 FINDINGS: PULMONARY ARTERY: Adequate contrast opacification of the pulmonary artery's. Main pulmonary artery is not enlarged. No pulmonary arterial filling defects to the level of the subsegmental branches. MEDIASTINUM: Heart heart size appears normal, no right heart strain. Anterior car deformity due to pectus excavatum. No pericardial effusions. Thoracic aorta is normal course and caliber, mild calcific atherosclerosis. No lymphadenopathy by CT size criteria. 9 mm prevascular lymph node with reniform morphology. LUNGS: Small amount of debris within the RIGHT lower lobe bronchus. No pneumothorax. Mild bronchial wall thickening. Patchy hypo enhancing consolidation RIGHT middle lobe. Enhancing atelectasis and bilateral lower lobes. Faint central lobular ground-glass densities bilateral lower lobes lobe. Mild centrilobular emphysema. No pleural effusion. SOFT TISSUES AND OSSEOUS STRUCTURES: Included view of the abdomen is unremarkable. Visualized soft tissues and included osseous structures appear normal. Review of the MIP images confirms the above findings. IMPRESSION: No acute pulmonary embolism. Bronchial wall thickening can be seen with reactive airway disease or bronchitis. Small amount of debris in RIGHT lower lobe bronchus,  possible aspiration. RIGHT middle lobe probable pneumonia, with bilateral lower lobe ground-glass densities which may be infectious or inflammatory. Electronically Signed   By: CElon AlasM.D.   On: 05/03/2015 21:46    Images reviewed 05/04/2015       ASSESSMENT/PLAN   65yo white male seen today for hemoptysis and cough, CT chest c/w emphysema and opacities with mucus  Likely Dx of COPD exacarbation from acute pneumonia  1.start spiriva 2.start dulera 3.start oral prednisone 4.smoking cessation strongly advised 5.d/c TB isolation 6.spurum culture if possible 7.no indication for bronch at this time 8.change abx to levaquin 9.does nOT meet ICU/SD criteria-transfer patient out to gen med floor  Follow up with LRafael Biharias outpatient.    I have personally obtained a history, examined the patient, evaluated laboratory and independently reviewed imaging results, formulated the assessment and plan and placed orders.  The Patient requires high complexity decision making for assessment and support, frequent evaluation and titration of therapies, application of advanced monitoring technologies and extensive interpretation of multiple databases.   Patient/Family are satisfied with Plan of action and management. All questions answered  KCorrin Parker M.D.  LVelora HecklerPulmonary & Critical Care Medicine  Medical Director IChambersburgDirector AWarren State HospitalCardio-Pulmonary Department

## 2015-05-04 NOTE — Progress Notes (Signed)
Per Dr. Mortimer Fries pt may transfer to any med surg unit

## 2015-05-05 DIAGNOSIS — J690 Pneumonitis due to inhalation of food and vomit: Secondary | ICD-10-CM | POA: Diagnosis not present

## 2015-05-05 DIAGNOSIS — N289 Disorder of kidney and ureter, unspecified: Secondary | ICD-10-CM | POA: Diagnosis not present

## 2015-05-05 DIAGNOSIS — J189 Pneumonia, unspecified organism: Secondary | ICD-10-CM | POA: Diagnosis not present

## 2015-05-05 DIAGNOSIS — Z720 Tobacco use: Secondary | ICD-10-CM

## 2015-05-05 DIAGNOSIS — I10 Essential (primary) hypertension: Secondary | ICD-10-CM

## 2015-05-05 DIAGNOSIS — J441 Chronic obstructive pulmonary disease with (acute) exacerbation: Secondary | ICD-10-CM | POA: Diagnosis not present

## 2015-05-05 DIAGNOSIS — R042 Hemoptysis: Secondary | ICD-10-CM | POA: Diagnosis not present

## 2015-05-05 LAB — BASIC METABOLIC PANEL
ANION GAP: 5 (ref 5–15)
BUN: 15 mg/dL (ref 6–20)
CALCIUM: 8.7 mg/dL — AB (ref 8.9–10.3)
CO2: 28 mmol/L (ref 22–32)
Chloride: 106 mmol/L (ref 101–111)
Creatinine, Ser: 1.18 mg/dL (ref 0.61–1.24)
GLUCOSE: 119 mg/dL — AB (ref 65–99)
POTASSIUM: 4.1 mmol/L (ref 3.5–5.1)
SODIUM: 139 mmol/L (ref 135–145)

## 2015-05-05 LAB — GLUCOSE, CAPILLARY: Glucose-Capillary: 231 mg/dL — ABNORMAL HIGH (ref 65–99)

## 2015-05-05 LAB — HEMOGLOBIN: HEMOGLOBIN: 13.5 g/dL (ref 13.0–18.0)

## 2015-05-05 MED ORDER — PREDNISONE 10 MG (21) PO TBPK: 10.0000 mg | ORAL_TABLET | Freq: Every day | ORAL | Status: DC

## 2015-05-05 MED ORDER — AMLODIPINE BESYLATE 5 MG PO TABS
5.0000 mg | ORAL_TABLET | Freq: Every day | ORAL | Status: DC
Start: 2015-05-05 — End: 2015-05-24

## 2015-05-05 MED ORDER — ALBUTEROL SULFATE (2.5 MG/3ML) 0.083% IN NEBU: 2.5000 mg | INHALATION_SOLUTION | RESPIRATORY_TRACT | Status: DC | PRN

## 2015-05-05 MED ORDER — MOMETASONE FURO-FORMOTEROL FUM 200-5 MCG/ACT IN AERO: 2.0000 | INHALATION_SPRAY | Freq: Two times a day (BID) | RESPIRATORY_TRACT | Status: DC

## 2015-05-05 MED ORDER — TIOTROPIUM BROMIDE MONOHYDRATE 18 MCG IN CAPS: 18.0000 ug | ORAL_CAPSULE | Freq: Every day | RESPIRATORY_TRACT | Status: DC

## 2015-05-05 MED ORDER — NICOTINE 21 MG/24HR TD PT24: 21.0000 mg | MEDICATED_PATCH | Freq: Every day | TRANSDERMAL | Status: DC

## 2015-05-05 MED ORDER — LEVOFLOXACIN 750 MG PO TABS: 750.0000 mg | ORAL_TABLET | Freq: Every day | ORAL | Status: DC

## 2015-05-05 NOTE — Discharge Summary (Signed)
[place] [facility] [place] NAME: Kevin Gutierrez    MR#:  [id number]  DATE OF BIRTH:  May 01, 1950  DATE OF ADMISSION:  05/03/2015 ADMITTING PHYSICIAN: Saundra Shelling, MD  DATE OF DISCHARGE: No discharge date for patient encounter.  PRIMARY CARE PHYSICIAN: No primary care provider on file.     ADMISSION DIAGNOSIS:  Hemoptysis [R04.2]  DISCHARGE DIAGNOSIS:  Principal Problem:   Hemoptysis Active Problems:   Pneumonia   Hypoxia   COPD exacerbation (HCC)   Essential hypertension   Renal insufficiency   Hyperglycemia   SECONDARY DIAGNOSIS:   Past Medical History  Diagnosis Date  . Nephrolithiasis     .pro HOSPITAL COURSE:   The patient is 65 year old Caucasian male with history of tobacco abuse who presents to the hospital with complaints of shortness of breath and cough. He apparently has been coughing for the past 1-2 weeks but did before admission he started coughing up blood. He admitted of smoking one pack a day for at least 50 years. Labs on arrival to emergency room, revealed mild elevation of creatinine to 1.31, elevated glucose level of 209 but no leukocytosis. CT angiogram of the chest revealed no acute PE, but the bronchial wall thickening, which could be seen with reactive airway disease or bronchitis. Small amount of debris was found to be in right lower lobe bronchus and the right middle lobe pneumonia was noted as well as bilateral lower lobe ground glass densities which were felt to be infectious or inflammatory,  aspiration was suggested. Patient was admitted to the hospital, initiated on broad-spectrum antibiotic therapy and pulmonology consultation was obtained. Pulmonologist recommended to initiate patients. By, inhaled steroids, oral prednisone. He advised against smoking and recommended to continue levofloxacin to complete course. This conservative therapy, patient's condition improved and he was ready to be discharged home.  The patient's blood pressure was found to be elevated in the hospital requiring Norvasc initiation Discussion by the problem 1. COPD exacerbation due to pneumonia, continue patient on duo nebs , Dulera, levofloxacin, tiotropium, taper steroids 3. Bilateral pneumonia with hemoptysis, continue Levaquin, pending sputum cultures, pulmonologist , did not feel that patient has TB, isolation was  Discontinued, patient is to follow-up with primary care physician for further recommendations after completion of antibiotic course. He would benefit from pulmonary follow-up as well.  3. Renal insufficiency, resolved with IV fluids, urinalysis was unremarkable, no suspicion for UTI  4. Hyperglycemia, likely due to steroids, hemoglobin A1c was 5.8 , no diabetes 5. Tobacco abuse, discussed with patient for 3 minutes. Nicotine replacement therapy to be continued 6. Essential hypertension, continue patient on Norvasc, advance blood pressure medications as needed DISCHARGE CONDITIONS:   Stable  CONSULTS OBTAINED:     DRUG ALLERGIES:  No Known Allergies  DISCHARGE MEDICATIONS:   Current Discharge Medication List    START taking these medications   Details  albuterol (PROVENTIL) (2.5 MG/3ML) 0.083% nebulizer solution Take 3 mLs (2.5 mg total) by nebulization every 3 (three) hours as needed for wheezing or shortness of breath. Qty: 75 mL, Refills: 12    amLODipine (NORVASC) 5 MG tablet Take 1 tablet (5 mg total) by mouth daily. Qty: 30 tablet, Refills: 6    levofloxacin (LEVAQUIN) 750 MG tablet Take 1 tablet (750 mg total) by mouth daily. Qty: 5 tablet, Refills: 0    mometasone-formoterol (DULERA) 200-5 MCG/ACT AERO Inhale 2 puffs into the lungs 2 (two) times daily. Qty: 1 Inhaler, Refills: 6  predniSONE (STERAPRED UNI-PAK 21 TAB) 10 MG (21) TBPK tablet Take 1 tablet (10 mg total) by mouth daily. He take 5 tablets on the day 1 in the morning and then taper by 1 tablet every day until finished, thank  you Qty: 21 tablet, Refills: 0    tiotropium (SPIRIVA) 18 MCG inhalation capsule Place 1 capsule (18 mcg total) into inhaler and inhale daily. Qty: 30 capsule, Refills: 12      CONTINUE these medications which have NOT CHANGED   Details  acetaminophen (TYLENOL) 325 MG tablet Take 650 mg by mouth every 6 (six) hours as needed for moderate pain, fever or headache.         DISCHARGE INSTRUCTIONS:    Patient is to follow-up with primary care physician and pulmonologist  If you experience worsening of your admission symptoms, develop shortness of breath, life threatening emergency, suicidal or homicidal thoughts you must seek medical attention immediately by calling 911 or calling your MD immediately  if symptoms less severe.  You Must read complete instructions/literature along with all the possible adverse reactions/side effects for all the Medicines you take and that have been prescribed to you. Take any new Medicines after you have completely understood and accept all the possible adverse reactions/side effects.   Please note  You were cared for by a hospitalist during your hospital stay. If you have any questions about your discharge medications or the care you received while you were in the hospital after you are discharged, you can call the unit and asked to speak with the hospitalist on call if the hospitalist that took care of you is not available. Once you are discharged, your primary care physician will handle any further medical issues. Please note that NO REFILLS for any discharge medications will be authorized once you are discharged, as it is imperative that you return to your primary care physician (or establish a relationship with a primary care physician if you do not have one) for your aftercare needs so that they can reassess your need for medications and monitor your lab values.    Today   CHIEF COMPLAINT:   Chief Complaint  Patient presents with  . Hemoptysis     HISTORY OF PRESENT ILLNESS:  Kevin Gutierrez  is a 65 y.o. male with a known history of  tobacco abuse who presents to the hospital with complaints of shortness of breath and cough. He apparently has been coughing for the past 1-2 weeks but did before admission he started coughing up blood. He admitted of smoking one pack a day for at least 50 years. Labs on arrival to emergency room, revealed mild elevation of creatinine to 1.31, elevated glucose level of 209 but no leukocytosis. CT angiogram of the chest revealed no acute PE, but the bronchial wall thickening, which could be seen with reactive airway disease or bronchitis. Small amount of debris was found to be in right lower lobe bronchus and the right middle lobe pneumonia was noted as well as bilateral lower lobe ground glass densities which were felt to be infectious or inflammatory,  aspiration was suggested. Patient was admitted to the hospital, initiated on broad-spectrum antibiotic therapy and pulmonology consultation was obtained. Pulmonologist recommended to initiate patients. By, inhaled steroids, oral prednisone. He advised against smoking and recommended to continue levofloxacin to complete course. This conservative therapy, patient's condition improved and he was ready to be discharged home. The patient's blood pressure was found to be elevated in the hospital requiring Norvasc initiation  Discussion by the problem 1. COPD exacerbation due to pneumonia, continue patient on duo nebs , Dulera, levofloxacin, tiotropium, taper steroids 3. Bilateral pneumonia with hemoptysis, continue Levaquin, pending sputum cultures, pulmonologist , did not feel that patient has TB, isolation was  Discontinued, patient is to follow-up with primary care physician for further recommendations after completion of antibiotic course. He would benefit from pulmonary follow-up as well.  3. Renal insufficiency, resolved with IV fluids, urinalysis was unremarkable, no  suspicion for UTI  4. Hyperglycemia, likely due to steroids, hemoglobin A1c was 5.8 , no diabetes 5. Tobacco abuse, discussed with patient for 3 minutes. Nicotine replacement therapy to be continued 6. Essential hypertension, continue patient on Norvasc, advance blood pressure medications as needed   VITAL SIGNS:  Blood pressure 154/78, pulse 72, temperature 97.9 F (36.6 C), temperature source Oral, resp. rate 18, height '5\' 11"'$  (1.803 m), weight 74.844 kg (165 lb), SpO2 94 %.  I/O:   Intake/Output Summary (Last 24 hours) at 05/05/15 1310 Last data filed at 05/05/15 0900  Gross per 24 hour  Intake 1192.5 ml  Output   1002 ml  Net  190.5 ml    PHYSICAL EXAMINATION:  GENERAL:  65 y.o.-year-old patient lying in the bed with no acute distress.  EYES: Pupils equal, round, reactive to light and accommodation. No scleral icterus. Extraocular muscles intact.  HEENT: Head atraumatic, normocephalic. Oropharynx and nasopharynx clear.  NECK:  Supple, no jugular venous distention. No thyroid enlargement, no tenderness.  LUNGS: Normal breath sounds bilaterally, no wheezing, rales,rhonchi or crepitation. No use of accessory muscles of respiration.  CARDIOVASCULAR: S1, S2 normal. No murmurs, rubs, or gallops.  ABDOMEN: Soft, non-tender, non-distended. Bowel sounds present. No organomegaly or mass.  EXTREMITIES: No pedal edema, cyanosis, or clubbing.  NEUROLOGIC: Cranial nerves II through XII are intact. Muscle strength 5/5 in all extremities. Sensation intact. Gait not checked.  PSYCHIATRIC: The patient is alert and oriented x 3.  SKIN: No obvious rash, lesion, or ulcer.   DATA REVIEW:   CBC  Recent Labs Lab 05/04/15 0922 05/05/15 0518  WBC 9.0  --   HGB 14.4 13.5  HCT 43.8  --   PLT 166  --     Chemistries   Recent Labs Lab 05/03/15 1912  05/05/15 0518  NA 139  < > 139  K 4.1  < > 4.1  CL 103  < > 106  CO2 31  < > 28  GLUCOSE 136*  < > 119*  BUN 14  < > 15  CREATININE  1.25*  < > 1.18  CALCIUM 8.8*  < > 8.7*  AST 15  --   --   ALT 8*  --   --   ALKPHOS 99  --   --   BILITOT 0.1*  --   --   < > = values in this interval not displayed.  Cardiac Enzymes No results for input(s): TROPONINI in the last 168 hours.  Microbiology Results  Results for orders placed or performed during the hospital encounter of 05/03/15  Culture, blood (Routine X 2) w Reflex to ID Panel     Status: None (Preliminary result)   Collection Time: 05/03/15 10:16 PM  Result Value Ref Range Status   Specimen Description BLOOD LEFT ANTECUBITAL  Final   Special Requests   Final    BOTTLES DRAWN AEROBIC AND ANAEROBIC 5MLANAEROBIC, 10MLAEROBIC   Culture NO GROWTH < 12 HOURS  Final   Report Status PENDING  Incomplete  Culture, blood (Routine X 2) w Reflex to ID Panel     Status: None (Preliminary result)   Collection Time: 05/03/15 10:30 PM  Result Value Ref Range Status   Specimen Description BLOOD LEFT ARM  Final   Special Requests BOTTLES DRAWN AEROBIC AND ANAEROBIC 5ML  Final   Culture NO GROWTH < 12 HOURS  Final   Report Status PENDING  Incomplete  MRSA PCR Screening     Status: None   Collection Time: 05/04/15  3:42 AM  Result Value Ref Range Status   MRSA by PCR NEGATIVE NEGATIVE Final    Comment:        The GeneXpert MRSA Assay (FDA approved for NASAL specimens only), is one component of a comprehensive MRSA colonization surveillance program. It is not intended to diagnose MRSA infection nor to guide or monitor treatment for MRSA infections.   Culture, expectorated sputum-assessment     Status: None   Collection Time: 05/04/15  8:52 AM  Result Value Ref Range Status   Specimen Description SPUTUM  Final   Special Requests NONE  Final   Sputum evaluation   Final    THIS SPECIMEN IS ACCEPTABLE FOR SPUTUM CULTURE GROSSLY BLOODY    Report Status 05/04/2015 FINAL  Final  Culture, respiratory (NON-Expectorated)     Status: None (Preliminary result)   Collection  Time: 05/04/15  8:52 AM  Result Value Ref Range Status   Specimen Description SPUTUM  Final   Special Requests NONE Reflexed from [id number]  Final   Gram Stain PENDING  Incomplete   Culture HOLDING FOR POSSIBLE PATHOGEN  Final   Report Status PENDING  Incomplete    RADIOLOGY:  Ct Angio Chest Pe W/cm &/or Wo Cm  05/03/2015  CLINICAL DATA:  Hemoptysis, sneezing. Recent upper respiratory tract infection. EXAM: CT ANGIOGRAPHY CHEST WITH CONTRAST TECHNIQUE: Multidetector CT imaging of the chest was performed using the standard protocol during bolus administration of intravenous contrast. Multiplanar CT image reconstructions and MIPs were obtained to evaluate the vascular anatomy. CONTRAST:  37m OMNIPAQUE IOHEXOL 350 MG/ML SOLN COMPARISON:  Chest radiograph June 04, 2014 FINDINGS: PULMONARY ARTERY: Adequate contrast opacification of the pulmonary artery's. Main pulmonary artery is not enlarged. No pulmonary arterial filling defects to the level of the subsegmental branches. MEDIASTINUM: Heart heart size appears normal, no right heart strain. Anterior car deformity due to pectus excavatum. No pericardial effusions. Thoracic aorta is normal course and caliber, mild calcific atherosclerosis. No lymphadenopathy by CT size criteria. 9 mm prevascular lymph node with reniform morphology. LUNGS: Small amount of debris within the RIGHT lower lobe bronchus. No pneumothorax. Mild bronchial wall thickening. Patchy hypo enhancing consolidation RIGHT middle lobe. Enhancing atelectasis and bilateral lower lobes. Faint central lobular ground-glass densities bilateral lower lobes lobe. Mild centrilobular emphysema. No pleural effusion. SOFT TISSUES AND OSSEOUS STRUCTURES: Included view of the abdomen is unremarkable. Visualized soft tissues and included osseous structures appear normal. Review of the MIP images confirms the above findings. IMPRESSION: No acute pulmonary embolism. Bronchial wall thickening can be seen with  reactive airway disease or bronchitis. Small amount of debris in RIGHT lower lobe bronchus, possible aspiration. RIGHT middle lobe probable pneumonia, with bilateral lower lobe ground-glass densities which may be infectious or inflammatory. Electronically Signed   By: CElon AlasM.D.   On: 05/03/2015 21:46    EKG:   Orders placed or performed during the hospital encounter of 05/03/15  . ED EKG  . ED EKG      Management plans  discussed with the patient, family and they are in agreement.  CODE STATUS:     Code Status Orders        Start     Ordered   05/04/15 0341  Full code   Continuous     05/04/15 0340    Code Status History    Date Active Date Inactive Code Status Order ID Comments User Context   This patient has a current code status but no historical code status.      TOTAL TIME TAKING CARE OF THIS PATIENT: 40 minutes.    Theodoro Grist M.D on 05/05/2015 at 1:10 PM  Between 7am to 6pm - Pager - [phone]  After 6pm go to [email or web address] - password [email or web address] [facility]  [facility]  Office  [phone]  CC: Primary care physician; No primary care provider on file.

## 2015-05-05 NOTE — Progress Notes (Signed)
Patient discharged home per MD order. VSS. IV removed and in tact. All discharge instructions given and all questions answered. Escorted by wife and aux.

## 2015-05-05 NOTE — Consult Note (Signed)
[facility] Consultation      Date: 05/05/2015,   MRN# [id number] ERNAN RUNKLES [birth year] Code Status:     Code Status Orders        Start     Ordered   05/04/15 0341  Full code   Continuous     05/04/15 0340     Hosp day:'@LENGTHOFSTAYDAYS'$ @ Referring MD: '@ATDPROV'$ @     PCP:      AdmissionWeight: 165 lb (74.844 kg)                 CurrentWeight: 165 lb (74.844 kg) ROMULO OKRAY is a 65 y.o. old male seen in consultation for hemoptysis the request of Dr Estanislado Pandy   CHIEF COMPLAINT:   Acute coughing up blood   HISTORY OF PRESENT ILLNESS  Cough and hemoptysis much improved Patient feels much better, weaned off oxygen today Started on inhalers and prednisone   REVIEW OF SYSTEMS   Review of Systems  Constitutional: Negative for fever, chills and weight loss.  Respiratory: Positive for cough and hemoptysis. Negative for sputum production, shortness of breath and wheezing.   Cardiovascular: Negative for chest pain.  Musculoskeletal: Negative.   Psychiatric/Behavioral: The patient is not nervous/anxious.   All other systems reviewed and are negative.    VS: BP 154/78 mmHg  Pulse 72  Temp(Src) 97.9 F (36.6 C) (Oral)  Resp 18  Ht '5\' 11"'$  (1.803 m)  Wt 165 lb (74.844 kg)  BMI 23.02 kg/m2  SpO2 94%     PHYSICAL EXAM  Physical Exam  Constitutional: He is oriented to person, place, and time. He appears well-developed and well-nourished. No distress.  Eyes: EOM are normal.  Cardiovascular: Normal rate, regular rhythm and normal heart sounds.   No murmur heard. Pulmonary/Chest: Effort normal and breath sounds normal. No respiratory distress. He has no wheezes. He has no rales.  Neurological: He is alert and oriented to person, place, and time.  Skin: Skin is warm. He is not diaphoretic.        LABS    Recent Labs     05/03/15  1912  05/04/15  0922  05/05/15  0518  HGB  15.4  14.4  13.5  HCT  46.2  43.8   --   MCV  95.5  94.5   --    WBC  8.3  9.0   --   BUN  '14  17  15  '$ CREATININE  1.25*  1.31*  1.18  GLUCOSE  136*  209*  119*  CALCIUM  8.8*  8.8*  8.7*  INR   --   0.99   --   ,      CULTURE RESULTS   Recent Results (from the past 240 hour(s))  Culture, blood (Routine X 2) w Reflex to ID Panel     Status: None (Preliminary result)   Collection Time: 05/03/15 10:16 PM  Result Value Ref Range Status   Specimen Description BLOOD LEFT ANTECUBITAL  Final   Special Requests   Final    BOTTLES DRAWN AEROBIC AND ANAEROBIC 5MLANAEROBIC, 10MLAEROBIC   Culture NO GROWTH < 12 HOURS  Final   Report Status PENDING  Incomplete  Culture, blood (Routine X 2) w Reflex to ID Panel     Status: None (Preliminary result)   Collection Time: 05/03/15 10:30 PM  Result Value Ref Range Status   Specimen Description BLOOD LEFT ARM  Final   Special Requests BOTTLES DRAWN AEROBIC AND ANAEROBIC 5ML  Final  Culture NO GROWTH < 12 HOURS  Final   Report Status PENDING  Incomplete  MRSA PCR Screening     Status: None   Collection Time: 05/04/15  3:42 AM  Result Value Ref Range Status   MRSA by PCR NEGATIVE NEGATIVE Final    Comment:        The GeneXpert MRSA Assay (FDA approved for NASAL specimens only), is one component of a comprehensive MRSA colonization surveillance program. It is not intended to diagnose MRSA infection nor to guide or monitor treatment for MRSA infections.   Culture, expectorated sputum-assessment     Status: None   Collection Time: 05/04/15  8:52 AM  Result Value Ref Range Status   Specimen Description SPUTUM  Final   Special Requests NONE  Final   Sputum evaluation   Final    THIS SPECIMEN IS ACCEPTABLE FOR SPUTUM CULTURE GROSSLY BLOODY    Report Status 05/04/2015 FINAL  Final  Culture, respiratory (NON-Expectorated)     Status: None (Preliminary result)   Collection Time: 05/04/15  8:52 AM  Result Value Ref Range Status   Specimen Description SPUTUM  Final   Special Requests NONE Reflexed from  [id number]  Final   Gram Stain PENDING  Incomplete   Culture HOLDING FOR POSSIBLE PATHOGEN  Final   Report Status PENDING  Incomplete          IMAGING    Ct Angio Chest Pe W/cm &/or Wo Cm  05/03/2015  CLINICAL DATA:  Hemoptysis, sneezing. Recent upper respiratory tract infection. EXAM: CT ANGIOGRAPHY CHEST WITH CONTRAST TECHNIQUE: Multidetector CT imaging of the chest was performed using the standard protocol during bolus administration of intravenous contrast. Multiplanar CT image reconstructions and MIPs were obtained to evaluate the vascular anatomy. CONTRAST:  81m OMNIPAQUE IOHEXOL 350 MG/ML SOLN COMPARISON:  Chest radiograph June 04, 2014 FINDINGS: PULMONARY ARTERY: Adequate contrast opacification of the pulmonary artery's. Main pulmonary artery is not enlarged. No pulmonary arterial filling defects to the level of the subsegmental branches. MEDIASTINUM: Heart heart size appears normal, no right heart strain. Anterior car deformity due to pectus excavatum. No pericardial effusions. Thoracic aorta is normal course and caliber, mild calcific atherosclerosis. No lymphadenopathy by CT size criteria. 9 mm prevascular lymph node with reniform morphology. LUNGS: Small amount of debris within the RIGHT lower lobe bronchus. No pneumothorax. Mild bronchial wall thickening. Patchy hypo enhancing consolidation RIGHT middle lobe. Enhancing atelectasis and bilateral lower lobes. Faint central lobular ground-glass densities bilateral lower lobes lobe. Mild centrilobular emphysema. No pleural effusion. SOFT TISSUES AND OSSEOUS STRUCTURES: Included view of the abdomen is unremarkable. Visualized soft tissues and included osseous structures appear normal. Review of the MIP images confirms the above findings. IMPRESSION: No acute pulmonary embolism. Bronchial wall thickening can be seen with reactive airway disease or bronchitis. Small amount of debris in RIGHT lower lobe bronchus, possible aspiration. RIGHT middle  lobe probable pneumonia, with bilateral lower lobe ground-glass densities which may be infectious or inflammatory. Electronically Signed   By: CElon AlasM.D.   On: 05/03/2015 21:46     ASSESSMENT/PLAN   65yo white male with  hemoptysis and cough, CT chest c/w emphysema and opacities with mucus  Likely Dx of COPD exacarbation from acute pneumonia  1.continue spiriva 2.continue dulera 3.continue oral prednisone 4.smoking cessation strongly advised 5.d/c TB isolation 6.follow up sputum culture 7.no indication for bronch at this time  Follow up with [facility] as outpatient.    Will sign off at this time  The Patient requires high complexity decision making for assessment and support, frequent evaluation and titration of therapies, application of advanced monitoring technologies and extensive interpretation of multiple databases.   Patient/Family are satisfied with Plan of action and management. All questions answered  Corrin Parker, M.D.  [facility]  Medical Director [place] Director [facility] Cardio-Pulmonary Department

## 2015-05-05 NOTE — Plan of Care (Signed)
Problem: Safety: Goal: Ability to remain free from injury will improve Outcome: Progressing Pt moderate fall risk. Instructed to call if need assistance. Wife at bedside.      Problem: Health Behavior/Discharge Planning: Goal: Ability to manage health-related needs will improve Outcome: Progressing Oxygen at 3 liters. Vital signs stable.   Problem: Physical Regulation: Goal: Will remain free from infection Outcome: Progressing Pt afebrile. Hand and respiratory hygiene encouraged.   Problem: Activity: Goal: Risk for activity intolerance will decrease Outcome: Progressing Pt c/o dizziness upon awakening. Vital signs obtained and were stable. Later patient stated " I know what happen, I have a history of panic attacks. I was on Klonopin but I'm not anymore and I don't want to be"  Problem: Fluid Volume: Goal: Ability to maintain a balanced intake and output will improve Outcome: Progressing Voiding in the urinal without difficulty. Declined bladder scan that was ordered earlier.

## 2015-05-06 LAB — CULTURE, RESPIRATORY: CULTURE: NORMAL

## 2015-05-06 LAB — CULTURE, RESPIRATORY W GRAM STAIN

## 2015-05-07 ENCOUNTER — Telehealth: Payer: Self-pay | Admitting: *Deleted

## 2015-05-07 MED ORDER — ZOLPIDEM TARTRATE 5 MG PO TABS: 5.0000 mg | ORAL_TABLET | Freq: Every evening | ORAL | Status: DC | PRN

## 2015-05-07 NOTE — Telephone Encounter (Signed)
Pt calling stating he was discharged from hospital and he does have PCP  Prednisone may be causing patient to have issues with sleep Not able to sleep, not at all he states Please advise.  Went to bed 12 am and then 130 am and has not been in bed since then.  Would like to know what can be done.

## 2015-05-07 NOTE — Telephone Encounter (Signed)
Pt informed we are calling in Ambien x 30 tabs for sleep PRN.  Spoke with Lenna Sciara at the [facility] and gave rx. Nothing further needed.

## 2015-05-07 NOTE — Telephone Encounter (Signed)
ambien 5 mg at night as needed

## 2015-05-07 NOTE — Telephone Encounter (Signed)
Please advise or if you prefer pt's PCP to handle. Thanks.

## 2015-05-08 LAB — CULTURE, BLOOD (ROUTINE X 2)
CULTURE: NO GROWTH
Culture: NO GROWTH

## 2015-05-10 DIAGNOSIS — R0602 Shortness of breath: Secondary | ICD-10-CM | POA: Diagnosis not present

## 2015-05-14 ENCOUNTER — Encounter: Payer: Self-pay | Admitting: Internal Medicine

## 2015-05-14 ENCOUNTER — Ambulatory Visit (INDEPENDENT_AMBULATORY_CARE_PROVIDER_SITE_OTHER): Payer: PPO | Admitting: Internal Medicine

## 2015-05-14 VITALS — BP 118/82 | HR 105 | Ht 71.0 in | Wt 166.0 lb

## 2015-05-14 DIAGNOSIS — J449 Chronic obstructive pulmonary disease, unspecified: Secondary | ICD-10-CM

## 2015-05-14 MED ORDER — UMECLIDINIUM BROMIDE 62.5 MCG/INH IN AEPB: 1.0000 | INHALATION_SPRAY | Freq: Every day | RESPIRATORY_TRACT | Status: DC

## 2015-05-14 MED ORDER — UMECLIDINIUM BROMIDE 62.5 MCG/INH IN AEPB: 1.0000 | INHALATION_SPRAY | Freq: Every day | RESPIRATORY_TRACT | Status: AC

## 2015-05-14 MED ORDER — FLUTICASONE FUROATE-VILANTEROL 100-25 MCG/INH IN AEPB: 1.0000 | INHALATION_SPRAY | Freq: Every day | RESPIRATORY_TRACT | Status: DC

## 2015-05-14 MED ORDER — FLUTICASONE FUROATE-VILANTEROL 100-25 MCG/INH IN AEPB: 1.0000 | INHALATION_SPRAY | Freq: Every day | RESPIRATORY_TRACT | Status: AC

## 2015-05-14 NOTE — Patient Instructions (Signed)
Chronic Obstructive Pulmonary Disease Chronic obstructive pulmonary disease (COPD) is a common lung condition in which airflow from the lungs is limited. COPD is a general term that can be used to describe many different lung problems that limit airflow, including both chronic bronchitis and emphysema. If you have COPD, your lung function will probably never return to normal, but there are measures you can take to improve lung function and make yourself feel better. CAUSES   Smoking (common).  Exposure to secondhand smoke.  Genetic problems.  Chronic inflammatory lung diseases or recurrent infections. SYMPTOMS  Shortness of breath, especially with physical activity.  Deep, persistent (chronic) cough with a large amount of thick mucus.  Wheezing.  Rapid breaths (tachypnea).  Gray or bluish discoloration (cyanosis) of the skin, especially in your fingers, toes, or lips.  Fatigue.  Weight loss.  Frequent infections or episodes when breathing symptoms become much worse (exacerbations).  Chest tightness. DIAGNOSIS Your health care provider will take a medical history and perform a physical examination to diagnose COPD. Additional tests for COPD may include:  Lung (pulmonary) function tests.  Chest X-ray.  CT scan.  Blood tests. TREATMENT  Treatment for COPD may include:  Inhaler and nebulizer medicines. These help manage the symptoms of COPD and make your breathing more comfortable.  Supplemental oxygen. Supplemental oxygen is only helpful if you have a low oxygen level in your blood.  Exercise and physical activity. These are beneficial for nearly all people with COPD.  Lung surgery or transplant.  Nutrition therapy to gain weight, if you are underweight.  Pulmonary rehabilitation. This may involve working with a team of health care providers and specialists, such as respiratory, occupational, and physical therapists. HOME CARE INSTRUCTIONS  Take all medicines  (inhaled or pills) as directed by your health care provider.  Avoid over-the-counter medicines or cough syrups that dry up your airway (such as antihistamines) and slow down the elimination of secretions unless instructed otherwise by your health care provider.  If you are a smoker, the most important thing that you can do is stop smoking. Continuing to smoke will cause further lung damage and breathing trouble. Ask your health care provider for help with quitting smoking. He or she can direct you to community resources or hospitals that provide support.  Avoid exposure to irritants such as smoke, chemicals, and fumes that aggravate your breathing.  Use oxygen therapy and pulmonary rehabilitation if directed by your health care provider. If you require home oxygen therapy, ask your health care provider whether you should purchase a pulse oximeter to measure your oxygen level at home.  Avoid contact with individuals who have a contagious illness.  Avoid extreme temperature and humidity changes.  Eat healthy foods. Eating smaller, more frequent meals and resting before meals may help you maintain your strength.  Stay active, but balance activity with periods of rest. Exercise and physical activity will help you maintain your ability to do things you want to do.  Preventing infection and hospitalization is very important when you have COPD. Make sure to receive all the vaccines your health care provider recommends, especially the pneumococcal and influenza vaccines. Ask your health care provider whether you need a pneumonia vaccine.  Learn and use relaxation techniques to manage stress.  Learn and use controlled breathing techniques as directed by your health care provider. Controlled breathing techniques include:  Pursed lip breathing. Start by breathing in (inhaling) through your nose for 1 second. Then, purse your lips as if you were   going to whistle and breathe out (exhale) through the  pursed lips for 2 seconds.  Diaphragmatic breathing. Start by putting one hand on your abdomen just above your waist. Inhale slowly through your nose. The hand on your abdomen should move out. Then purse your lips and exhale slowly. You should be able to feel the hand on your abdomen moving in as you exhale.  Learn and use controlled coughing to clear mucus from your lungs. Controlled coughing is a series of short, progressive coughs. The steps of controlled coughing are: 1. Lean your head slightly forward. 2. Breathe in deeply using diaphragmatic breathing. 3. Try to hold your breath for 3 seconds. 4. Keep your mouth slightly open while coughing twice. 5. Spit any mucus out into a tissue. 6. Rest and repeat the steps once or twice as needed. SEEK MEDICAL CARE IF:  You are coughing up more mucus than usual.  There is a change in the color or thickness of your mucus.  Your breathing is more labored than usual.  Your breathing is faster than usual. SEEK IMMEDIATE MEDICAL CARE IF:  You have shortness of breath while you are resting.  You have shortness of breath that prevents you from:  Being able to talk.  Performing your usual physical activities.  You have chest pain lasting longer than 5 minutes.  Your skin color is more cyanotic than usual.  You measure low oxygen saturations for longer than 5 minutes with a pulse oximeter. MAKE SURE YOU:  Understand these instructions.  Will watch your condition.  Will get help right away if you are not doing well or get worse.   This information is not intended to replace advice given to you by your health care provider. Make sure you discuss any questions you have with your health care provider.   Document Released: 01/18/2005 Document Revised: 05/01/2014 Document Reviewed: 12/05/2012 Elsevier Interactive Patient Education 2016 [employer]  

## 2015-05-14 NOTE — Progress Notes (Signed)
[facility] Consultation     Date: 05/14/2015,   MRN# [id number] BRON SNELLINGS 1950-06-18 Code Status:  Code Status History    Date Active Date Inactive Code Status Order ID Comments User Context   05/04/2015  3:40 AM 05/05/2015  6:02 PM Full Code [id number]  Saundra Shelling, MD Inpatient     Hosp day:'@LENGTHOFSTAYDAYS'$ @ Referring MD: '@ATDPROV'$ @     PCP:      AdmissionWeight: 166 lb (75.297 kg)                 CurrentWeight: 166 lb (75.297 kg) Kevin Gutierrez is a 65 y.o. old male seen in consultation for hsopital follow up for hemoptysis.   CHIEF COMPLAINT:   Follow up coughing and hemoptysis-improved since admission   HISTORY OF PRESENT ILLNESS  70  65 yo white male with ext  65 y.o. White male with a long standing smoking history 50 pack year, presented to the emergency room with difficulty breathing and coughing of blood -Patient also complains of having cough started  March of last year.  -No history of any fever. No history of any night sweats. 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  CT chest shows emphysematous changes and Bronchial wall thickening can be seen with reactive airway disease or bronchitis. Small amount of debris in RIGHT lower lobe bronchus, possible aspiration.  RIGHT middle lobe probable pneumonia, with bilateral lower lobe ground-glass densities  Patient treated for COPD exac with steroids and abx, started on inhaled therapy  No SOB, no Cough and NO hemoptysis today    Current Medication:   Current outpatient prescriptions:  .  acetaminophen (TYLENOL) 325 MG tablet, Take 650 mg by mouth every 6 (six) hours as needed for moderate pain, fever or headache., Disp: , Rfl:  .  albuterol (PROVENTIL) (2.5 MG/3ML) 0.083% nebulizer solution, Take 3 mLs (2.5 mg total) by nebulization every 3 (three) hours as needed for wheezing or shortness of breath., Disp: 75 mL, Rfl: 12 .  amLODipine (NORVASC) 5 MG  tablet, Take 1 tablet (5 mg total) by mouth daily., Disp: 30 tablet, Rfl: 6 .  mometasone-formoterol (DULERA) 200-5 MCG/ACT AERO, Inhale 2 puffs into the lungs 2 (two) times daily., Disp: 1 Inhaler, Rfl: 6 .  tiotropium (SPIRIVA) 18 MCG inhalation capsule, Place 1 capsule (18 mcg total) into inhaler and inhale daily., Disp: 30 capsule, Rfl: 12 .  zolpidem (AMBIEN) 5 MG tablet, Take 1 tablet (5 mg total) by mouth at bedtime as needed for sleep., Disp: 30 tablet, Rfl: 0 .  Fluticasone Furoate-Vilanterol 100-25 MCG/INH AEPB, Inhale 1 puff into the lungs daily., Disp: 60 each, Rfl: 5 .  Fluticasone Furoate-Vilanterol 100-25 MCG/INH AEPB, Inhale 1 puff into the lungs daily., Disp: 14 each, Rfl: 0 .  Umeclidinium Bromide (INCRUSE ELLIPTA) 62.5 MCG/INH AEPB, Inhale 1 puff into the lungs daily., Disp: 30 each, Rfl: 5 .  Umeclidinium Bromide (INCRUSE ELLIPTA) 62.5 MCG/INH AEPB, Inhale 1 puff into the lungs daily., Disp: 7 each, Rfl: 0     ALLERGIES   Review of patient's allergies indicates no known allergies.     REVIEW OF SYSTEMS   Review of Systems  Constitutional: Negative for fever, chills, weight loss and malaise/fatigue.  HENT: Negative for congestion and sore throat.   Respiratory: Negative for cough, hemoptysis, sputum production, shortness of breath and wheezing.   Cardiovascular: Negative for chest pain and leg swelling.  Gastrointestinal: Negative for nausea, vomiting and abdominal  pain.  Neurological: Negative for dizziness.  All other systems reviewed and are negative.    VS: BP 118/82 mmHg  Pulse 105  Ht '5\' 11"'$  (1.803 m)  Wt 166 lb (75.297 kg)  BMI 23.16 kg/m2  SpO2 92%     PHYSICAL EXAM   Physical Exam  Constitutional: He is oriented to person, place, and time. He appears well-developed and well-nourished. No distress.  HENT:  Head: Normocephalic and atraumatic.  Mouth/Throat: No oropharyngeal exudate.  Eyes: EOM are normal.  Cardiovascular: Normal rate, regular  rhythm and normal heart sounds.   No murmur heard. Pulmonary/Chest: No stridor. No respiratory distress. He has no wheezes.  Abdominal: Soft. Bowel sounds are normal.  Musculoskeletal: Normal range of motion. He exhibits no edema.  Neurological: He is alert and oriented to person, place, and time.  Skin: Skin is warm. He is not diaphoretic.  Psychiatric: He has a normal mood and affect.         ASSESSMENT/PLAN    65 yo white male with recent admission for hemoptysis and cough, CT chest c/w emphysema and opacities with mucus  Likely Dx of COPD exacarbation from acute pneumonia. Patient has shown significant improvement since admission  1.start breo 2.start incruse, albuterol as needed 3.will need PFT's and 6 Minute walk test 4.check overnight pulse ox 5.will repeat CT chest in 4 weeks for interval changes  Follow up in 4 weeks after complete testing  The Patient requires high complexity decision making for assessment and support, frequent evaluation and titration of therapies, application of advanced monitoring technologies and extensive interpretation of multiple databases.   Patient satisfied with Plan of action and management. All questions answered   Corrin Parker, M.D.  [facility]  Medical Director [place] Director [facility] Cardio-Pulmonary Department

## 2015-05-17 ENCOUNTER — Encounter: Payer: Self-pay | Admitting: Family Medicine

## 2015-05-17 ENCOUNTER — Other Ambulatory Visit: Payer: Self-pay | Admitting: Family Medicine

## 2015-05-17 ENCOUNTER — Ambulatory Visit (INDEPENDENT_AMBULATORY_CARE_PROVIDER_SITE_OTHER): Payer: PPO | Admitting: Family Medicine

## 2015-05-17 ENCOUNTER — Ambulatory Visit: Payer: PPO | Admitting: Family Medicine

## 2015-05-17 VITALS — BP 112/64 | HR 96 | Temp 97.9°F | Ht 72.25 in | Wt 164.5 lb

## 2015-05-17 DIAGNOSIS — L609 Nail disorder, unspecified: Secondary | ICD-10-CM | POA: Diagnosis not present

## 2015-05-17 DIAGNOSIS — Z720 Tobacco use: Secondary | ICD-10-CM

## 2015-05-17 DIAGNOSIS — N179 Acute kidney failure, unspecified: Secondary | ICD-10-CM

## 2015-05-17 DIAGNOSIS — I10 Essential (primary) hypertension: Secondary | ICD-10-CM

## 2015-05-17 DIAGNOSIS — J439 Emphysema, unspecified: Secondary | ICD-10-CM

## 2015-05-17 DIAGNOSIS — L989 Disorder of the skin and subcutaneous tissue, unspecified: Secondary | ICD-10-CM

## 2015-05-17 DIAGNOSIS — Z8711 Personal history of peptic ulcer disease: Secondary | ICD-10-CM

## 2015-05-17 DIAGNOSIS — Z87442 Personal history of urinary calculi: Secondary | ICD-10-CM

## 2015-05-17 DIAGNOSIS — Z0000 Encounter for general adult medical examination without abnormal findings: Secondary | ICD-10-CM | POA: Diagnosis not present

## 2015-05-17 DIAGNOSIS — L602 Onychogryphosis: Secondary | ICD-10-CM

## 2015-05-17 DIAGNOSIS — G4700 Insomnia, unspecified: Secondary | ICD-10-CM | POA: Insufficient documentation

## 2015-05-17 DIAGNOSIS — Z8719 Personal history of other diseases of the digestive system: Secondary | ICD-10-CM | POA: Diagnosis not present

## 2015-05-17 DIAGNOSIS — J441 Chronic obstructive pulmonary disease with (acute) exacerbation: Secondary | ICD-10-CM | POA: Insufficient documentation

## 2015-05-17 DIAGNOSIS — Z125 Encounter for screening for malignant neoplasm of prostate: Secondary | ICD-10-CM

## 2015-05-17 HISTORY — DX: Personal history of urinary calculi: Z87.442

## 2015-05-17 HISTORY — DX: Personal history of peptic ulcer disease: Z87.11

## 2015-05-17 MED ORDER — ASPIRIN EC 81 MG PO TBEC: 81.0000 mg | DELAYED_RELEASE_TABLET | Freq: Every day | ORAL | Status: AC

## 2015-05-17 MED ORDER — PANTOPRAZOLE SODIUM 40 MG PO TBEC
40.0000 mg | DELAYED_RELEASE_TABLET | Freq: Every day | ORAL | Status: DC
Start: 2015-05-17 — End: 2015-08-16

## 2015-05-17 NOTE — Assessment & Plan Note (Signed)
Patient has several areas that will need treatment and potentially biopsy (given significant sun damage). Referring to Derm.

## 2015-05-17 NOTE — Patient Instructions (Signed)
It was nice to see you today.  We will be in touch regarding your referrals to Podiatry and dermatology.  We will call with your lab results.  Take the protonix as prescribed.  Take a daily aspirin.  Follow up:  3-6 months.   Take care  Dr. Lacinda Axon

## 2015-05-17 NOTE — Progress Notes (Signed)
Pre visit review using our clinic review tool, if applicable. No additional management support is needed unless otherwise documented below in the visit note. 

## 2015-05-17 NOTE — Assessment & Plan Note (Signed)
Recent AKI. Checking labs today.

## 2015-05-17 NOTE — Assessment & Plan Note (Signed)
Has not had success with Nicotine replacement or Zyban in the past. Does not want chantix. Patient has a goal of cutting back slowly and then stopping. I encouraged him to quit and asked him to cut back to at 4 cigs or less by our next visit.

## 2015-05-17 NOTE — Assessment & Plan Note (Signed)
Tdap up to date. Declines further immunizations (Pneumococcal, Flu, Zoster). Labs today - Lipid and PSA.  Rechecking metabolic panel (due to elevated creatinine and prior AKI). Advised daily aspirin given risk factors.

## 2015-05-17 NOTE — Assessment & Plan Note (Signed)
Stable currently. Followed by pulm. Continue Breo ellipta and Incruse.

## 2015-05-17 NOTE — Assessment & Plan Note (Signed)
Referral to Podiatry for toenail care.

## 2015-05-17 NOTE — Assessment & Plan Note (Signed)
Starting on Protonix today (given that I started patient on daily Aspirin).

## 2015-05-17 NOTE — Assessment & Plan Note (Signed)
Well controlled. Continue Norvasc.

## 2015-05-17 NOTE — Progress Notes (Signed)
Subjective:  Patient ID: Kevin Gutierrez, male    DOB: 25-Oct-1950  Age: 65 y.o. MRN: [id number]  CC: Establish care  HPI Kevin Gutierrez is a 65 y.o. male presents to the clinic today to establish care.  Preventative Healthcare  Colonoscopy: Has never had screening. Declines colonoscopy. He agrees to [employer].   Immunizations  Tetanus - last done in 2012. Up-to-date.  Pneumococcal - in need of. Declines.  Flu - declines.  Zoster - declined.  Prostate cancer screening: Briefly discussed screening. He has had screening in the past. PSA today.  Labs: Patient has had recent lab work but I cannot find a cholesterol panel. Will obtain today.  Exercise: No.  Alcohol use: See below.  Smoking/tobacco use: Current every day smoker.  Wears seat belt: Yes.   HTN  Well controlled on amlodipine.  Reports shortness of breath. No chest pain.  COPD  Patient currently doing well on Incruse and Breo ellipta.  Followed by pulm.  Tobacco abuse  Still smoking.  Has cut down to 7-8 cigarettes daily.  Has tried nicotine replacement and Zyban in the past without success. Fearful of Chantix.  PMH, Surgical Hx, Family Hx, Social History reviewed and updated as below.  Past Medical History  Diagnosis Date  . Nephrolithiasis   . Peptic ulcer disease   . Hypertension   . Chicken pox   . Kidney stones    Past Surgical History  Procedure Laterality Date  . Lithotripsy    . Spinal cord decompression  03/08/1994   Family History  Problem Relation Age of Onset  . Diabetes Mellitus II Brother   . Lung cancer Brother   . Heart disease Brother   . Hyperlipidemia Brother   . Heart disease Mother   . Stroke Father   . Hypertension Brother    Social History  Substance Use Topics  . Smoking status: Current Every Day Smoker -- 1.00 packs/day for 50 years    Types: Cigarettes  . Smokeless tobacco: Never Used     Comment: Currently smoking 7-8 cigarettes daily  . Alcohol Use:  No   Review of Systems  HENT: Positive for tinnitus.   Respiratory: Positive for cough and shortness of breath.        Recent hemoptysis.  Cardiovascular:       Irregular heart beat.  Neurological: Positive for weakness.       Short term memory issues.  All other systems reviewed and are negative.  Objective:   Today's Vitals: BP 112/64 mmHg  Pulse 96  Temp(Src) 97.9 F (36.6 C) (Oral)  Ht 6' 0.25" (1.835 m)  Wt 164 lb 8 oz (74.617 kg)  BMI 22.16 kg/m2  SpO2 92%  Physical Exam  Constitutional: He is oriented to person, place, and time.  Elderly male in NAD.   HENT:  Head: Normocephalic and atraumatic.  Right Ear: External ear normal.  Left Ear: External ear normal.  TM's without erythema bilaterally.   Eyes: Conjunctivae are normal.  Neck: Neck supple.  Cardiovascular: Normal rate.   Irregular. Likely from PVC/PACs.  Pulmonary/Chest: Effort normal and breath sounds normal. No respiratory distress. He has no wheezes. He has no rales.  Abdominal: Soft. He exhibits no distension. There is no tenderness. There is no rebound and no guarding.  Musculoskeletal: Normal range of motion. He exhibits no edema.  Neurological: He is alert and oriented to person, place, and time.  Skin:  Feet - Thick overgrown nails noted bilaterally (all digits). Onychomycosis  noted.  Forearms/hands - multiple coarse, raised skin lesions. Several lesions are AK's and some may be SCC.  Psychiatric: He has a normal mood and affect.  Vitals reviewed.   Assessment & Plan:   Problem List Items Addressed This Visit    Essential hypertension    Well controlled. Continue Norvasc.      Relevant Medications   aspirin EC 81 MG tablet   Other Relevant Orders   Comp Met (CMET)   Lipid panel   Tobacco abuse    Has not had success with Nicotine replacement or Zyban in the past. Does not want chantix. Patient has a goal of cutting back slowly and then stopping. I encouraged him to quit and asked him  to cut back to at 4 cigs or less by our next visit.      Acute kidney injury (HCC)    Recent AKI. Checking labs today.      Preventative health care - Primary    Tdap up to date. Declines further immunizations (Pneumococcal, Flu, Zoster). Labs today - Lipid and PSA.  Rechecking metabolic panel (due to elevated creatinine and prior AKI). Advised daily aspirin given risk factors.       Overgrown toenails    Referral to Podiatry for toenail care.       Relevant Orders   Ambulatory referral to Podiatry   COPD (chronic obstructive pulmonary disease) ([place])    Stable currently. Followed by pulm. Continue Breo ellipta and Incruse.      History of stomach ulcers    Starting on Protonix today (given that I started patient on daily Aspirin).      Skin lesions    Patient has several areas that will need treatment and potentially biopsy (given significant sun damage). Referring to Derm.      Relevant Orders   Ambulatory referral to Dermatology    Other Visit Diagnoses    Prostate cancer screening        Relevant Orders    PSA, Medicare       Outpatient Encounter Prescriptions as of 05/17/2015  Medication Sig  . acetaminophen (TYLENOL) 325 MG tablet Take 650 mg by mouth every 6 (six) hours as needed for moderate pain, fever or headache.  . albuterol (PROVENTIL) (2.5 MG/3ML) 0.083% nebulizer solution Take 3 mLs (2.5 mg total) by nebulization every 3 (three) hours as needed for wheezing or shortness of breath.  Marland Kitchen amLODipine (NORVASC) 5 MG tablet Take 1 tablet (5 mg total) by mouth daily.  . Fluticasone Furoate-Vilanterol 100-25 MCG/INH AEPB Inhale 1 puff into the lungs daily.  Marland Kitchen Umeclidinium Bromide (INCRUSE ELLIPTA) 62.5 MCG/INH AEPB Inhale 1 puff into the lungs daily.  Marland Kitchen zolpidem (AMBIEN) 5 MG tablet Take 1 tablet (5 mg total) by mouth at bedtime as needed for sleep.  . [DISCONTINUED] ranitidine (ZANTAC) 150 MG capsule Take 150 mg by mouth 2 (two) times daily.  Marland Kitchen aspirin EC 81  MG tablet Take 1 tablet (81 mg total) by mouth daily.  . pantoprazole (PROTONIX) 40 MG tablet Take 1 tablet (40 mg total) by mouth daily.   No facility-administered encounter medications on file as of 05/17/2015.    Follow-up: 3-6 months.  Schneider

## 2015-05-18 LAB — LIPID PANEL
CHOL/HDL RATIO: 3
CHOLESTEROL: 149 mg/dL (ref 0–200)
HDL: 44.7 mg/dL (ref >39.00)
LDL CALC: 86 mg/dL (ref 0–99)
NonHDL: 104.73
TRIGLYCERIDES: 93 mg/dL (ref 0.0–149.0)
VLDL: 18.6 mg/dL (ref 0.0–40.0)

## 2015-05-18 LAB — COMPREHENSIVE METABOLIC PANEL
ALBUMIN: 3.5 g/dL (ref 3.5–5.2)
ALT: 12 U/L (ref 0–53)
AST: 20 U/L (ref 0–37)
Alkaline Phosphatase: 72 U/L (ref 39–117)
BILIRUBIN TOTAL: 0.6 mg/dL (ref 0.2–1.2)
BUN: 15 mg/dL (ref 6–23)
CALCIUM: 8.8 mg/dL (ref 8.4–10.5)
CHLORIDE: 102 meq/L (ref 96–112)
CO2: 25 meq/L (ref 19–32)
CREATININE: 1.21 mg/dL (ref 0.40–1.50)
GFR: 63.73 mL/min (ref >60.00)
Glucose, Bld: 98 mg/dL (ref 70–99)
Potassium: 4.9 mEq/L (ref 3.5–5.1)
Sodium: 137 mEq/L (ref 135–145)
Total Protein: 6.5 g/dL (ref 6.0–8.3)

## 2015-05-18 LAB — PSA, MEDICARE: PSA: 0.71 ng/ml (ref 0.10–4.00)

## 2015-05-19 ENCOUNTER — Encounter: Payer: Self-pay | Admitting: Family Medicine

## 2015-05-19 DIAGNOSIS — N183 Chronic kidney disease, stage 3 unspecified: Secondary | ICD-10-CM | POA: Insufficient documentation

## 2015-05-21 ENCOUNTER — Other Ambulatory Visit: Payer: Self-pay | Admitting: Family Medicine

## 2015-05-21 MED ORDER — ATORVASTATIN CALCIUM 20 MG PO TABS: 20.0000 mg | ORAL_TABLET | Freq: Every day | ORAL | Status: DC

## 2015-05-23 ENCOUNTER — Encounter: Payer: Self-pay | Admitting: Internal Medicine

## 2015-05-23 DIAGNOSIS — R0602 Shortness of breath: Secondary | ICD-10-CM | POA: Diagnosis not present

## 2015-05-23 DIAGNOSIS — J449 Chronic obstructive pulmonary disease, unspecified: Secondary | ICD-10-CM | POA: Diagnosis not present

## 2015-05-24 ENCOUNTER — Telehealth: Payer: Self-pay

## 2015-05-24 ENCOUNTER — Telehealth: Payer: Self-pay | Admitting: Internal Medicine

## 2015-05-24 ENCOUNTER — Other Ambulatory Visit: Payer: Self-pay | Admitting: Family Medicine

## 2015-05-24 MED ORDER — AMLODIPINE BESYLATE 5 MG PO TABS: 5.0000 mg | ORAL_TABLET | Freq: Every day | ORAL | Status: DC

## 2015-05-24 NOTE — Telephone Encounter (Signed)
Notified pt of Dr. Geralynn Ochs comments

## 2015-05-24 NOTE — Telephone Encounter (Signed)
Pt states his insurance won't Breo and Incruse. States his insurance will cover the Spiriva. He would like to know if if can continue the Spiriva since it is covered by insurance?  Ruthe Mannan is not covered by insurance either. Please advise.

## 2015-05-24 NOTE — Telephone Encounter (Signed)
The hospital put Kevin Gutierrez amLODipine ([place]) 5 MG tablet , pt wants to know if he should continue taking this medication and if so he needs a refill. Please advise

## 2015-05-24 NOTE — Telephone Encounter (Signed)
How about advair?

## 2015-05-24 NOTE — Telephone Encounter (Signed)
Yes he should continue. I sent in a refill.

## 2015-05-25 ENCOUNTER — Ambulatory Visit: Payer: PPO

## 2015-05-25 MED ORDER — FLUTICASONE-SALMETEROL 250-50 MCG/DOSE IN AEPB: 1.0000 | INHALATION_SPRAY | Freq: Two times a day (BID) | RESPIRATORY_TRACT | Status: DC

## 2015-05-25 MED ORDER — TIOTROPIUM BROMIDE MONOHYDRATE 18 MCG IN CAPS: 18.0000 ug | ORAL_CAPSULE | Freq: Every day | RESPIRATORY_TRACT | Status: DC

## 2015-05-25 NOTE — Telephone Encounter (Signed)
Pt informed that KK wants him to take Spiriva HH and Advair 250/50 1 puff BID. Rxs sent to pharmacy. Nothing further needed.

## 2015-06-01 ENCOUNTER — Telehealth: Payer: Self-pay | Admitting: *Deleted

## 2015-06-01 DIAGNOSIS — J449 Chronic obstructive pulmonary disease, unspecified: Secondary | ICD-10-CM

## 2015-06-01 NOTE — Telephone Encounter (Signed)
Pt informed of the need for O2 for night time use. Order placed.

## 2015-06-03 DIAGNOSIS — M79674 Pain in right toe(s): Secondary | ICD-10-CM | POA: Diagnosis not present

## 2015-06-03 DIAGNOSIS — B351 Tinea unguium: Secondary | ICD-10-CM | POA: Diagnosis not present

## 2015-06-03 DIAGNOSIS — M79675 Pain in left toe(s): Secondary | ICD-10-CM | POA: Diagnosis not present

## 2015-06-07 ENCOUNTER — Ambulatory Visit
Admission: RE | Admit: 2015-06-07 | Discharge: 2015-06-07 | Disposition: A | Discharge status: Home / Self Care | Payer: PPO | Source: Ambulatory Visit | Attending: Internal Medicine | Admitting: Internal Medicine

## 2015-06-07 ENCOUNTER — Telehealth: Payer: Self-pay | Admitting: Internal Medicine

## 2015-06-07 DIAGNOSIS — J189 Pneumonia, unspecified organism: Secondary | ICD-10-CM | POA: Diagnosis not present

## 2015-06-07 DIAGNOSIS — J479 Bronchiectasis, uncomplicated: Secondary | ICD-10-CM | POA: Diagnosis not present

## 2015-06-07 DIAGNOSIS — I2510 Atherosclerotic heart disease of native coronary artery without angina pectoris: Secondary | ICD-10-CM | POA: Diagnosis not present

## 2015-06-07 DIAGNOSIS — I700 Atherosclerosis of aorta: Secondary | ICD-10-CM | POA: Diagnosis not present

## 2015-06-07 DIAGNOSIS — J449 Chronic obstructive pulmonary disease, unspecified: Secondary | ICD-10-CM | POA: Insufficient documentation

## 2015-06-07 DIAGNOSIS — M954 Acquired deformity of chest and rib: Secondary | ICD-10-CM | POA: Insufficient documentation

## 2015-06-09 NOTE — Telephone Encounter (Signed)
Provider notified. Rhonda J Cobb

## 2015-06-10 ENCOUNTER — Ambulatory Visit: Payer: PPO | Admitting: Internal Medicine

## 2015-06-21 ENCOUNTER — Other Ambulatory Visit: Payer: Self-pay | Admitting: Internal Medicine

## 2015-06-21 ENCOUNTER — Ambulatory Visit (INDEPENDENT_AMBULATORY_CARE_PROVIDER_SITE_OTHER): Payer: PPO | Admitting: *Deleted

## 2015-06-21 DIAGNOSIS — J449 Chronic obstructive pulmonary disease, unspecified: Secondary | ICD-10-CM | POA: Diagnosis not present

## 2015-06-21 DIAGNOSIS — J438 Other emphysema: Secondary | ICD-10-CM

## 2015-06-21 DIAGNOSIS — J439 Emphysema, unspecified: Secondary | ICD-10-CM

## 2015-06-21 LAB — PULMONARY FUNCTION TEST
DL/VA % pred: 50 %
DL/VA: 2.38 ml/min/mmHg/L
DLCO UNC % PRED: 39 %
DLCO UNC: 13.79 ml/min/mmHg
FEF 25-75 PRE: 1.07 L/s
FEF 25-75 Post: 1.08 L/sec
FEF2575-%Change-Post: 0 %
FEF2575-%Pred-Post: 37 %
FEF2575-%Pred-Pre: 37 %
FEV1-%CHANGE-POST: 0 %
FEV1-%PRED-POST: 60 %
FEV1-%Pred-Pre: 59 %
FEV1-POST: 2.2 L
FEV1-Pre: 2.18 L
FEV1FVC-%Change-Post: 2 %
FEV1FVC-%PRED-PRE: 81 %
FEV6-%Change-Post: -3 %
FEV6-%PRED-POST: 74 %
FEV6-%Pred-Pre: 77 %
FEV6-POST: 3.48 L
FEV6-PRE: 3.6 L
FEV6FVC-%PRED-POST: 105 %
FEV6FVC-%PRED-PRE: 105 %
FVC-%Change-Post: -1 %
FVC-%Pred-Post: 72 %
FVC-%Pred-Pre: 73 %
FVC-Post: 3.54 L
FVC-Pre: 3.6 L
PRE FEV6/FVC RATIO: 100 %
Post FEV1/FVC ratio: 62 %
Post FEV6/FVC ratio: 100 %
Pre FEV1/FVC ratio: 60 %
RV % PRED: 104 %
RV: 2.58 L
TLC % PRED: 84 %
TLC: 6.25 L

## 2015-06-21 NOTE — Progress Notes (Signed)
PFT performed today. 

## 2015-06-21 NOTE — Progress Notes (Signed)
SMW performed today. 

## 2015-06-30 ENCOUNTER — Other Ambulatory Visit
Admission: RE | Admit: 2015-06-30 | Discharge: 2015-06-30 | Disposition: A | Discharge status: Home / Self Care | Payer: PPO | Source: Ambulatory Visit | Attending: Internal Medicine | Admitting: Internal Medicine

## 2015-06-30 ENCOUNTER — Telehealth: Payer: Self-pay | Admitting: Internal Medicine

## 2015-06-30 DIAGNOSIS — J209 Acute bronchitis, unspecified: Secondary | ICD-10-CM | POA: Insufficient documentation

## 2015-06-30 LAB — EXPECTORATED SPUTUM ASSESSMENT W REFEX TO RESP CULTURE

## 2015-06-30 LAB — EXPECTORATED SPUTUM ASSESSMENT W GRAM STAIN, RFLX TO RESP C

## 2015-06-30 MED ORDER — PREDNISONE 10 MG (21) PO TBPK: ORAL_TABLET | ORAL | Status: DC

## 2015-06-30 NOTE — Telephone Encounter (Signed)
Sputum culture.  Prednisone taper, 10 mg x 21 tabs, no refills. Take 6 tabs the first day then 5-4-3-2-1-stop.

## 2015-06-30 NOTE — Telephone Encounter (Signed)
Pt states he has a lot of congestion, and cold like symptoms, this started yesterday,  Coughing, and chills, fever. Thinks he may need to be seen, or an antibiotic. States he coughed up something yesterday and was " a little red speck" in it, and not sure if it was blood, or something he ate. Please call.

## 2015-06-30 NOTE — Telephone Encounter (Signed)
KK pt. Has congestion, prod cough with white mucus but one did have speck of red in it. KK seen pt in hospital prev for coughing up blood. Has had low grade temp x 2 days and says chest feels heavy and full. Please advise.

## 2015-06-30 NOTE — Telephone Encounter (Signed)
Pt informed of response. Medication sent to pharmacy. Nothing further needed.

## 2015-07-03 LAB — CULTURE, RESPIRATORY

## 2015-07-03 LAB — CULTURE, RESPIRATORY W GRAM STAIN: Culture: NORMAL

## 2015-07-05 ENCOUNTER — Ambulatory Visit (INDEPENDENT_AMBULATORY_CARE_PROVIDER_SITE_OTHER): Payer: PPO | Admitting: Internal Medicine

## 2015-07-05 ENCOUNTER — Encounter: Payer: Self-pay | Admitting: *Deleted

## 2015-07-05 ENCOUNTER — Ambulatory Visit: Payer: PPO | Admitting: Internal Medicine

## 2015-07-05 ENCOUNTER — Encounter: Payer: Self-pay | Admitting: Internal Medicine

## 2015-07-05 VITALS — BP 140/82 | HR 110 | Ht 72.0 in | Wt 173.6 lb

## 2015-07-05 DIAGNOSIS — G4700 Insomnia, unspecified: Secondary | ICD-10-CM | POA: Diagnosis not present

## 2015-07-05 DIAGNOSIS — J449 Chronic obstructive pulmonary disease, unspecified: Secondary | ICD-10-CM | POA: Diagnosis not present

## 2015-07-05 MED ORDER — ALBUTEROL SULFATE HFA 108 (90 BASE) MCG/ACT IN AERS: 2.0000 | INHALATION_SPRAY | RESPIRATORY_TRACT | Status: DC | PRN

## 2015-07-05 MED ORDER — ZOLPIDEM TARTRATE 5 MG PO TABS
5.0000 mg | ORAL_TABLET | Freq: Every evening | ORAL | Status: DC | PRN
Start: 2015-07-05 — End: 2016-01-18

## 2015-07-05 NOTE — Patient Instructions (Signed)
Chronic Obstructive Pulmonary Disease Chronic obstructive pulmonary disease (COPD) is a common lung condition in which airflow from the lungs is limited. COPD is a general term that can be used to describe many different lung problems that limit airflow, including both chronic bronchitis and emphysema. If you have COPD, your lung function will probably never return to normal, but there are measures you can take to improve lung function and make yourself feel better. CAUSES   Smoking (common).  Exposure to secondhand smoke.  Genetic problems.  Chronic inflammatory lung diseases or recurrent infections. SYMPTOMS  Shortness of breath, especially with physical activity.  Deep, persistent (chronic) cough with a large amount of thick mucus.  Wheezing.  Rapid breaths (tachypnea).  Gray or bluish discoloration (cyanosis) of the skin, especially in your fingers, toes, or lips.  Fatigue.  Weight loss.  Frequent infections or episodes when breathing symptoms become much worse (exacerbations).  Chest tightness. DIAGNOSIS Your health care provider will take a medical history and perform a physical examination to diagnose COPD. Additional tests for COPD may include:  Lung (pulmonary) function tests.  Chest X-ray.  CT scan.  Blood tests. TREATMENT  Treatment for COPD may include:  Inhaler and nebulizer medicines. These help manage the symptoms of COPD and make your breathing more comfortable.  Supplemental oxygen. Supplemental oxygen is only helpful if you have a low oxygen level in your blood.  Exercise and physical activity. These are beneficial for nearly all people with COPD.  Lung surgery or transplant.  Nutrition therapy to gain weight, if you are underweight.  Pulmonary rehabilitation. This may involve working with a team of health care providers and specialists, such as respiratory, occupational, and physical therapists. HOME CARE INSTRUCTIONS  Take all medicines  (inhaled or pills) as directed by your health care provider.  Avoid over-the-counter medicines or cough syrups that dry up your airway (such as antihistamines) and slow down the elimination of secretions unless instructed otherwise by your health care provider.  If you are a smoker, the most important thing that you can do is stop smoking. Continuing to smoke will cause further lung damage and breathing trouble. Ask your health care provider for help with quitting smoking. He or she can direct you to community resources or hospitals that provide support.  Avoid exposure to irritants such as smoke, chemicals, and fumes that aggravate your breathing.  Use oxygen therapy and pulmonary rehabilitation if directed by your health care provider. If you require home oxygen therapy, ask your health care provider whether you should purchase a pulse oximeter to measure your oxygen level at home.  Avoid contact with individuals who have a contagious illness.  Avoid extreme temperature and humidity changes.  Eat healthy foods. Eating smaller, more frequent meals and resting before meals may help you maintain your strength.  Stay active, but balance activity with periods of rest. Exercise and physical activity will help you maintain your ability to do things you want to do.  Preventing infection and hospitalization is very important when you have COPD. Make sure to receive all the vaccines your health care provider recommends, especially the pneumococcal and influenza vaccines. Ask your health care provider whether you need a pneumonia vaccine.  Learn and use relaxation techniques to manage stress.  Learn and use controlled breathing techniques as directed by your health care provider. Controlled breathing techniques include:  Pursed lip breathing. Start by breathing in (inhaling) through your nose for 1 second. Then, purse your lips as if you were   going to whistle and breathe out (exhale) through the  pursed lips for 2 seconds.  Diaphragmatic breathing. Start by putting one hand on your abdomen just above your waist. Inhale slowly through your nose. The hand on your abdomen should move out. Then purse your lips and exhale slowly. You should be able to feel the hand on your abdomen moving in as you exhale.  Learn and use controlled coughing to clear mucus from your lungs. Controlled coughing is a series of short, progressive coughs. The steps of controlled coughing are: 1. Lean your head slightly forward. 2. Breathe in deeply using diaphragmatic breathing. 3. Try to hold your breath for 3 seconds. 4. Keep your mouth slightly open while coughing twice. 5. Spit any mucus out into a tissue. 6. Rest and repeat the steps once or twice as needed. SEEK MEDICAL CARE IF:  You are coughing up more mucus than usual.  There is a change in the color or thickness of your mucus.  Your breathing is more labored than usual.  Your breathing is faster than usual. SEEK IMMEDIATE MEDICAL CARE IF:  You have shortness of breath while you are resting.  You have shortness of breath that prevents you from:  Being able to talk.  Performing your usual physical activities.  You have chest pain lasting longer than 5 minutes.  Your skin color is more cyanotic than usual.  You measure low oxygen saturations for longer than 5 minutes with a pulse oximeter. MAKE SURE YOU:  Understand these instructions.  Will watch your condition.  Will get help right away if you are not doing well or get worse.   This information is not intended to replace advice given to you by your health care provider. Make sure you discuss any questions you have with your health care provider.   Document Released: 01/18/2005 Document Revised: 05/01/2014 Document Reviewed: 12/05/2012 Elsevier Interactive Patient Education 2016 [employer]  

## 2015-07-05 NOTE — Progress Notes (Signed)
[facility] Consultation     Date: 07/05/2015,   MRN# [id number] Kevin Gutierrez 11/14/1950 Code Status:  Code Status History    Date Active Date Inactive Code Status Order ID Comments User Context   05/04/2015  3:40 AM 05/05/2015  6:02 PM Full Code [id number]  Saundra Shelling, MD Inpatient     Hosp day:'@LENGTHOFSTAYDAYS'$ @ Referring MD: '@ATDPROV'$ @     PCP:      AdmissionWeight: 173 lb 9.6 oz (78.744 kg)                 CurrentWeight: 173 lb 9.6 oz (78.744 kg) Kevin Gutierrez is a 65 y.o. old male seen in consultation for hsopital follow up for hemoptysis.   CHIEF COMPLAINT:   Follow up coughing and wheezing   HISTORY OF PRESENT ILLNESS  67 patient 65 yo white male with ext  Patient had increased cough and wheezing last week, started on prednisone taper Patient feels much better since taking steroids  No fevers chills at this time No signs of infection at this time  Patient refuses to wear oxygen at night time due to cost   CT chest shows emphysematous changes and Bronchial wall thickening can be seen with reactive airway disease or bronchitis. Small amount of debris in RIGHT lower lobe bronchus, possible aspiration  Repeat CT chows improvement of pneumonia, residual mucus still present   + SOB, + Cough and NO hemoptysis today Continues to smoke  PFT 05/2015 Ratio 60%  FEv1 59% predicted RD 118% DLCo 39%    Current Medication:   Current outpatient prescriptions:  .  acetaminophen (TYLENOL) 325 MG tablet, Take 650 mg by mouth every 6 (six) hours as needed for moderate pain, fever or headache., Disp: , Rfl:  .  albuterol (PROVENTIL) (2.5 MG/3ML) 0.083% nebulizer solution, Take 3 mLs (2.5 mg total) by nebulization every 3 (three) hours as needed for wheezing or shortness of breath., Disp: 75 mL, Rfl: 12 .  amLODipine (NORVASC) 5 MG tablet, Take 1 tablet (5 mg total) by mouth daily., Disp: 90 tablet, Rfl: 3 .  aspirin EC 81 MG tablet, Take 1 tablet (81  mg total) by mouth daily., Disp: , Rfl:  .  atorvastatin (LIPITOR) 20 MG tablet, Take 1 tablet (20 mg total) by mouth daily., Disp: 90 tablet, Rfl: 3 .  Fluticasone-Salmeterol (ADVAIR DISKUS) 250-50 MCG/DOSE AEPB, Inhale 1 puff into the lungs 2 (two) times daily., Disp: 60 each, Rfl: 5 .  pantoprazole (PROTONIX) 40 MG tablet, Take 1 tablet (40 mg total) by mouth daily., Disp: 90 tablet, Rfl: 0 .  predniSONE (STERAPRED UNI-PAK 21 TAB) 10 MG (21) TBPK tablet, Take 6 tabs the first day then 5-4-3-2-1-stop., Disp: 21 tablet, Rfl: 0 .  tiotropium (SPIRIVA) 18 MCG inhalation capsule, Place 1 capsule (18 mcg total) into inhaler and inhale daily., Disp: 30 capsule, Rfl: 5     ALLERGIES   Review of patient's allergies indicates no known allergies.     REVIEW OF SYSTEMS   Review of Systems  Constitutional: Negative for fever, chills, weight loss and malaise/fatigue.  HENT: Negative for congestion.   Respiratory: Positive for cough, shortness of breath and wheezing. Negative for hemoptysis and sputum production.   Cardiovascular: Negative for chest pain and leg swelling.  Gastrointestinal: Negative for heartburn, nausea, vomiting and abdominal pain.  Neurological: Negative for dizziness.  All other systems reviewed and are negative.    VS: BP 140/82 mmHg  Pulse 110  Ht 6' (1.829 m)  Wt 173 lb 9.6 oz (78.744 kg)  BMI 23.54 kg/m2  SpO2 90%     PHYSICAL EXAM   Physical Exam  Constitutional: He is oriented to person, place, and time. He appears well-developed and well-nourished. No distress.  HENT:  Head: Normocephalic and atraumatic.  Eyes: Pupils are equal, round, and reactive to light.  Cardiovascular: Normal rate, regular rhythm and normal heart sounds.   No murmur heard. Pulmonary/Chest: Effort normal. No stridor. No respiratory distress. He has wheezes. He has rales.  Musculoskeletal: Normal range of motion. He exhibits no edema.  Neurological: He is alert and oriented to  person, place, and time.  Skin: He is not diaphoretic.  Psychiatric: He has a normal mood and affect.         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   Patient satisfied with Plan of action and management. All questions answered   Corrin Parker, M.D.  [facility]  Medical Director [place] Director [facility] Cardio-Pulmonary Department

## 2015-07-06 ENCOUNTER — Telehealth: Payer: Self-pay | Admitting: Internal Medicine

## 2015-07-06 ENCOUNTER — Other Ambulatory Visit: Payer: Self-pay | Admitting: Internal Medicine

## 2015-07-06 MED ORDER — NYSTATIN 100000 UNIT/ML MT SUSP: OROMUCOSAL | Status: DC

## 2015-07-06 NOTE — Telephone Encounter (Signed)
Hold advair for one week, start nystatin swish and swallow 500,000 units 4 times/day for 1 week. Do not restart advair until the infection is gone. Call if develops again.

## 2015-07-06 NOTE — Telephone Encounter (Signed)
Nystatin change to 100,000 per DR. Pt informed of response. Nothing further needed.

## 2015-07-06 NOTE — Telephone Encounter (Signed)
Pt states he needs his Ambien called in. States he forgot to mention he has a yeast infection in his mouth and throat, and he needs something to treat that. Please call.

## 2015-07-06 NOTE — Telephone Encounter (Signed)
Ambien called in to pharmacy. Pt states he has a yeast infection in his mouth. Please advise.

## 2015-07-09 ENCOUNTER — Telehealth: Payer: Self-pay | Admitting: Internal Medicine

## 2015-07-09 MED ORDER — AZITHROMYCIN 250 MG PO TABS: ORAL_TABLET | ORAL | Status: DC

## 2015-07-09 NOTE — Telephone Encounter (Signed)
Per VM, pt needs to stop smoking and take Albuterol inhaler 2 puffs TID x 3 days and then use PRN.  Called pt to inform of VM response and pt states he is already taking rescue inhaler multiple times daily. Pt states he is smoking 3 cigs daily. Prod coughing with green mucus. Spoke with VM and he states to send in [place]. Pt informed. Zpak sent nothing further needed.

## 2015-07-09 NOTE — Telephone Encounter (Signed)
Pt calling stating he still has the cough  And it seems to not get better.  Would like some advise on this Please call patient.

## 2015-07-12 ENCOUNTER — Telehealth: Payer: Self-pay | Admitting: *Deleted

## 2015-07-12 NOTE — Telephone Encounter (Signed)
Submitted PA for Ambien thru [facility]. [id number] Will await response.

## 2015-07-15 NOTE — Telephone Encounter (Signed)
Ambien has been approved  Until 04/23/2016. Pharmacy informed.

## 2015-08-16 ENCOUNTER — Telehealth: Payer: Self-pay | Admitting: Family Medicine

## 2015-08-16 MED ORDER — PANTOPRAZOLE SODIUM 40 MG PO TBEC: 40.0000 mg | DELAYED_RELEASE_TABLET | Freq: Every day | ORAL | Status: DC

## 2015-08-16 NOTE — Telephone Encounter (Signed)
Rx refilled.

## 2015-08-16 NOTE — Telephone Encounter (Signed)
Pt called in about needing a refill for pantoprazole (PROTONIX) 40 MG tablet. Pharmacy is [facility] [place], [place]. Call pt @ [phone]. Thank you!

## 2015-09-21 ENCOUNTER — Encounter: Payer: Self-pay | Admitting: Internal Medicine

## 2015-09-21 ENCOUNTER — Ambulatory Visit (INDEPENDENT_AMBULATORY_CARE_PROVIDER_SITE_OTHER): Payer: PPO | Admitting: Internal Medicine

## 2015-09-21 ENCOUNTER — Telehealth: Payer: Self-pay | Admitting: Internal Medicine

## 2015-09-21 VITALS — BP 122/90 | HR 100 | Ht 72.0 in | Wt 175.0 lb

## 2015-09-21 DIAGNOSIS — J449 Chronic obstructive pulmonary disease, unspecified: Secondary | ICD-10-CM

## 2015-09-21 NOTE — Telephone Encounter (Signed)
Pt has a question regarding his COPD, states his breath smells like "I am breathing exhaust from a tailpipe".  Pt asks if this is normal for him having COPD. Please call and advise.

## 2015-09-21 NOTE — Progress Notes (Signed)
[facility] Consultation     Date: 09/21/2015,   MRN# [id number] Kevin Gutierrez 1950-11-20 Code Status:  Code Status History    Date Active Date Inactive Code Status Order ID Comments User Context   05/04/2015  3:40 AM 05/05/2015  6:02 PM Full Code [id number]  Saundra Shelling, MD Inpatient     Hosp day:'@LENGTHOFSTAYDAYS'$ @ Referring MD: '@ATDPROV'$ @     PCP:      AdmissionWeight: 175 lb (79.379 kg)                 CurrentWeight: 175 lb (79.379 kg) Kevin Gutierrez is a 65 y.o. old male seen in consultation for hsopital follow up for hemoptysis.   CHIEF COMPLAINT:   Follow up coughing and wheezing   HISTORY OF PRESENT ILLNESS   Patient breathing comfortably, no acute issues, still smokes  No fevers chills at this time No signs of infection at this time  Patient refuses to wear oxygen at night time due to cost   CT chest shows emphysematous changes and Bronchial wall thickening can be seen with reactive airway disease or bronchitis. Small amount of debris in RIGHT lower lobe bronchus, possible aspiration  Repeat CT chows improvement of pneumonia, residual mucus still present  Doing well with inhaler therapy  PFT 05/2015 Ratio 60%  FEv1 59% predicted RD 118% DLCo 39%    Current Medication:   Current outpatient prescriptions:  .  acetaminophen (TYLENOL) 325 MG tablet, Take 650 mg by mouth every 6 (six) hours as needed for moderate pain, fever or headache., Disp: , Rfl:  .  albuterol (PROVENTIL HFA;VENTOLIN HFA) 108 (90 Base) MCG/ACT inhaler, Inhale 2 puffs into the lungs every 4 (four) hours as needed for wheezing or shortness of breath., Disp: 1 Inhaler, Rfl: 2 .  albuterol (PROVENTIL) (2.5 MG/3ML) 0.083% nebulizer solution, Take 3 mLs (2.5 mg total) by nebulization every 3 (three) hours as needed for wheezing or shortness of breath., Disp: 75 mL, Rfl: 12 .  amLODipine (NORVASC) 5 MG tablet, Take 1 tablet (5 mg total) by mouth daily., Disp: 90 tablet, Rfl:  3 .  aspirin EC 81 MG tablet, Take 1 tablet (81 mg total) by mouth daily., Disp: , Rfl:  .  atorvastatin (LIPITOR) 20 MG tablet, Take 1 tablet (20 mg total) by mouth daily., Disp: 90 tablet, Rfl: 3 .  Fluticasone-Salmeterol (ADVAIR DISKUS) 250-50 MCG/DOSE AEPB, Inhale 1 puff into the lungs 2 (two) times daily., Disp: 60 each, Rfl: 5 .  pantoprazole (PROTONIX) 40 MG tablet, Take 1 tablet (40 mg total) by mouth daily., Disp: 90 tablet, Rfl: 2 .  tiotropium (SPIRIVA) 18 MCG inhalation capsule, Place 1 capsule (18 mcg total) into inhaler and inhale daily., Disp: 30 capsule, Rfl: 5 .  zolpidem (AMBIEN) 5 MG tablet, Take 1 tablet (5 mg total) by mouth at bedtime as needed for sleep., Disp: 30 tablet, Rfl: 5     ALLERGIES   Review of patient's allergies indicates no known allergies.     REVIEW OF SYSTEMS   Review of Systems  Constitutional: Negative for fever, chills, weight loss and malaise/fatigue.  HENT: Negative for congestion.   Respiratory: Positive for cough. Negative for hemoptysis, sputum production, shortness of breath and wheezing.   Cardiovascular: Negative for chest pain and leg swelling.  Gastrointestinal: Negative for heartburn.  Neurological: Negative for dizziness.  All other systems reviewed and are negative.    VS: BP 122/90 mmHg  Pulse 100  Ht 6' (1.829 m)  Wt 175 lb (79.379 kg)  BMI 23.73 kg/m2  SpO2 90%     PHYSICAL EXAM   Physical Exam  Constitutional: He is oriented to person, place, and time. No distress.  Cardiovascular: Normal rate, regular rhythm and normal heart sounds.   No murmur heard. Pulmonary/Chest: Effort normal. No stridor. No respiratory distress. He has no wheezes. He has no rales.  Musculoskeletal: Normal range of motion. He exhibits no edema.  Neurological: He is alert and oriented to person, place, and time.  Psychiatric: He has a normal mood and affect.         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   Patient satisfied with Plan of action and management. All questions answered   Corrin Parker, M.D.  [facility]  Medical Director [place] Director [facility] Cardio-Pulmonary Department

## 2015-09-21 NOTE — Patient Instructions (Signed)
Chronic Obstructive Pulmonary Disease Chronic obstructive pulmonary disease (COPD) is a common lung condition in which airflow from the lungs is limited. COPD is a general term that can be used to describe many different lung problems that limit airflow, including both chronic bronchitis and emphysema. If you have COPD, your lung function will probably never return to normal, but there are measures you can take to improve lung function and make yourself feel better. CAUSES   Smoking (common).  Exposure to secondhand smoke.  Genetic problems.  Chronic inflammatory lung diseases or recurrent infections. SYMPTOMS  Shortness of breath, especially with physical activity.  Deep, persistent (chronic) cough with a large amount of thick mucus.  Wheezing.  Rapid breaths (tachypnea).  Gray or bluish discoloration (cyanosis) of the skin, especially in your fingers, toes, or lips.  Fatigue.  Weight loss.  Frequent infections or episodes when breathing symptoms become much worse (exacerbations).  Chest tightness. DIAGNOSIS Your health care provider will take a medical history and perform a physical examination to diagnose COPD. Additional tests for COPD may include:  Lung (pulmonary) function tests.  Chest X-ray.  CT scan.  Blood tests. TREATMENT  Treatment for COPD may include:  Inhaler and nebulizer medicines. These help manage the symptoms of COPD and make your breathing more comfortable.  Supplemental oxygen. Supplemental oxygen is only helpful if you have a low oxygen level in your blood.  Exercise and physical activity. These are beneficial for nearly all people with COPD.  Lung surgery or transplant.  Nutrition therapy to gain weight, if you are underweight.  Pulmonary rehabilitation. This may involve working with a team of health care providers and specialists, such as respiratory, occupational, and physical therapists. HOME CARE INSTRUCTIONS  Take all medicines  (inhaled or pills) as directed by your health care provider.  Avoid over-the-counter medicines or cough syrups that dry up your airway (such as antihistamines) and slow down the elimination of secretions unless instructed otherwise by your health care provider.  If you are a smoker, the most important thing that you can do is stop smoking. Continuing to smoke will cause further lung damage and breathing trouble. Ask your health care provider for help with quitting smoking. He or she can direct you to community resources or hospitals that provide support.  Avoid exposure to irritants such as smoke, chemicals, and fumes that aggravate your breathing.  Use oxygen therapy and pulmonary rehabilitation if directed by your health care provider. If you require home oxygen therapy, ask your health care provider whether you should purchase a pulse oximeter to measure your oxygen level at home.  Avoid contact with individuals who have a contagious illness.  Avoid extreme temperature and humidity changes.  Eat healthy foods. Eating smaller, more frequent meals and resting before meals may help you maintain your strength.  Stay active, but balance activity with periods of rest. Exercise and physical activity will help you maintain your ability to do things you want to do.  Preventing infection and hospitalization is very important when you have COPD. Make sure to receive all the vaccines your health care provider recommends, especially the pneumococcal and influenza vaccines. Ask your health care provider whether you need a pneumonia vaccine.  Learn and use relaxation techniques to manage stress.  Learn and use controlled breathing techniques as directed by your health care provider. Controlled breathing techniques include:  Pursed lip breathing. Start by breathing in (inhaling) through your nose for 1 second. Then, purse your lips as if you were   going to whistle and breathe out (exhale) through the  pursed lips for 2 seconds.  Diaphragmatic breathing. Start by putting one hand on your abdomen just above your waist. Inhale slowly through your nose. The hand on your abdomen should move out. Then purse your lips and exhale slowly. You should be able to feel the hand on your abdomen moving in as you exhale.  Learn and use controlled coughing to clear mucus from your lungs. Controlled coughing is a series of short, progressive coughs. The steps of controlled coughing are: 1. Lean your head slightly forward. 2. Breathe in deeply using diaphragmatic breathing. 3. Try to hold your breath for 3 seconds. 4. Keep your mouth slightly open while coughing twice. 5. Spit any mucus out into a tissue. 6. Rest and repeat the steps once or twice as needed. SEEK MEDICAL CARE IF:  You are coughing up more mucus than usual.  There is a change in the color or thickness of your mucus.  Your breathing is more labored than usual.  Your breathing is faster than usual. SEEK IMMEDIATE MEDICAL CARE IF:  You have shortness of breath while you are resting.  You have shortness of breath that prevents you from:  Being able to talk.  Performing your usual physical activities.  You have chest pain lasting longer than 5 minutes.  Your skin color is more cyanotic than usual.  You measure low oxygen saturations for longer than 5 minutes with a pulse oximeter. MAKE SURE YOU:  Understand these instructions.  Will watch your condition.  Will get help right away if you are not doing well or get worse.   This information is not intended to replace advice given to you by your health care provider. Make sure you discuss any questions you have with your health care provider.   Document Released: 01/18/2005 Document Revised: 05/01/2014 Document Reviewed: 12/05/2012 Elsevier Interactive Patient Education 2016 [employer]  

## 2015-09-22 NOTE — Telephone Encounter (Signed)
Dentist and tic tac

## 2015-09-22 NOTE — Telephone Encounter (Signed)
Please advise 

## 2015-09-22 NOTE — Telephone Encounter (Signed)
Pt informed to f/u with a dentist. DK states smell is not coming from the COPD. Nothing further needed.

## 2015-10-11 ENCOUNTER — Telehealth: Payer: Self-pay | Admitting: Internal Medicine

## 2015-10-11 MED ORDER — NYSTATIN 100000 UNIT/ML MT SUSP: 5.0000 mL | Freq: Four times a day (QID) | OROMUCOSAL | Status: DC

## 2015-10-11 NOTE — Telephone Encounter (Signed)
Repeat course of nystatin that was previously administed.

## 2015-10-11 NOTE — Telephone Encounter (Signed)
Pt informed of medication being sent. Nothing further needed.

## 2015-10-11 NOTE — Telephone Encounter (Signed)
Please advise on the thrush pt is having in his throat. Thanks.

## 2015-10-11 NOTE — Telephone Encounter (Signed)
Pt is calling stating he has some "thrush" in his thoatt and would like [place] to call it something to help with this Please advise.

## 2015-11-15 ENCOUNTER — Ambulatory Visit (INDEPENDENT_AMBULATORY_CARE_PROVIDER_SITE_OTHER): Payer: PPO | Admitting: Family Medicine

## 2015-11-15 ENCOUNTER — Encounter: Payer: Self-pay | Admitting: Family Medicine

## 2015-11-15 VITALS — BP 142/100 | HR 84 | Temp 97.8°F | Wt 181.1 lb

## 2015-11-15 DIAGNOSIS — J439 Emphysema, unspecified: Secondary | ICD-10-CM

## 2015-11-15 DIAGNOSIS — I10 Essential (primary) hypertension: Secondary | ICD-10-CM

## 2015-11-15 DIAGNOSIS — C4491 Basal cell carcinoma of skin, unspecified: Secondary | ICD-10-CM | POA: Diagnosis not present

## 2015-11-15 DIAGNOSIS — E785 Hyperlipidemia, unspecified: Secondary | ICD-10-CM | POA: Insufficient documentation

## 2015-11-15 DIAGNOSIS — I739 Peripheral vascular disease, unspecified: Secondary | ICD-10-CM | POA: Diagnosis not present

## 2015-11-15 LAB — LIPID PANEL
CHOL/HDL RATIO: 3
Cholesterol: 99 mg/dL (ref 0–200)
HDL: 37.4 mg/dL — AB (ref >39.00)
LDL CALC: 52 mg/dL (ref 0–99)
NONHDL: 61.47
Triglycerides: 48 mg/dL (ref 0.0–149.0)
VLDL: 9.6 mg/dL (ref 0.0–40.0)

## 2015-11-15 LAB — COMPREHENSIVE METABOLIC PANEL
ALT: 9 U/L (ref 0–53)
AST: 13 U/L (ref 0–37)
Albumin: 4 g/dL (ref 3.5–5.2)
Alkaline Phosphatase: 85 U/L (ref 39–117)
BILIRUBIN TOTAL: 0.6 mg/dL (ref 0.2–1.2)
BUN: 19 mg/dL (ref 6–23)
CO2: 29 meq/L (ref 19–32)
CREATININE: 1.25 mg/dL (ref 0.40–1.50)
Calcium: 9.2 mg/dL (ref 8.4–10.5)
Chloride: 105 mEq/L (ref 96–112)
GFR: 61.29 mL/min (ref >60.00)
GLUCOSE: 118 mg/dL — AB (ref 70–99)
Potassium: 4 mEq/L (ref 3.5–5.1)
Sodium: 140 mEq/L (ref 135–145)
Total Protein: 7.1 g/dL (ref 6.0–8.3)

## 2015-11-15 NOTE — Assessment & Plan Note (Signed)
Stable. Lipid panel today. Continue Lipitor. 

## 2015-11-15 NOTE — Assessment & Plan Note (Signed)
New problem. Patient describing symptoms of claudication. Suspect PAD. Sending to vascular for evaluation.

## 2015-11-15 NOTE — Progress Notes (Signed)
Pre visit review using our clinic review tool, if applicable. No additional management support is needed unless otherwise documented below in the visit note. 

## 2015-11-15 NOTE — Assessment & Plan Note (Signed)
Established problem, stable.  BP slightly elevated today. Patient states that this is secondary to being in office. He states that it was normal when he checked earlier this morning. Continue Norvasc 5 mg daily.

## 2015-11-15 NOTE — Assessment & Plan Note (Signed)
New problem. Skin lesion appears to be consistent with basal cell carcinoma. Sending to dermatology and needs excision.

## 2015-11-15 NOTE — Progress Notes (Signed)
Subjective:  Patient ID: Kevin Gutierrez, male    DOB: [birth year]  Age: 65 y.o. MRN: [id number]  CC: Follow up  HPI:  65 year old male with hypertension, COPD, hyperlipidemia, CKD presents for follow up. He has 2 concerns today (see below).  HTN  Stable per report.   Compliant with Norvasc.  HLD  Patient on statin due to ASCVD risk score.  Compliant with statin.  Needs recheck today.  COPD  Stable on Spiriva, Advair. Followed by pulmonology.  Leg pain  Patient reports that he's had several months of leg pain with ambulation/activity.  Patient states that he has anterior lower leg pain and calf pain which occurs with ambulation/activity.  Patient states that he can walk approximately 100-150 feet and then has the pain.  He subsequently rests with resolution in his pain until he is active again.  Pain is severe.  He would like to discuss this today.   Skin lesion  Patient has a skin lesion on his chin underneath his beard.  He states that these had this for over 3 years.  He states that it has recently been troublesome. He states that it is irritated and often bleeds when he picks at it.  Patient would like this examined today.  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   Social Hx   Social History   Social History  . Marital status: Married    Spouse name: N/A  . Number of children: N/A  . Years of education: N/A   Occupational History  . retired    Social History Main Topics  . Smoking status: Current Every Day Smoker    Packs/day: 0.25    Years: 50.00    Types: Cigarettes  . Smokeless tobacco: Never Used     Comment: Currently smoking 7-8 cigarettes daily  . Alcohol use No  . Drug use:     Types: Marijuana     Comment: Occasional marijuana use  . Sexual activity: Not on file   Other Topics Concern  . Not on file   Social History Narrative   Lives with family   Review of Systems  Respiratory:  Positive for shortness of breath.     Objective:  BP (!) 142/100 (BP Location: Left Arm, Patient Position: Sitting, Cuff Size: Normal)   Pulse 84   Temp 97.8 F (36.6 C) (Oral)   Wt 181 lb 2 oz (82.2 kg)   SpO2 91%   BMI 24.56 kg/m   BP/Weight 11/15/2015 09/21/2015 5/63/8937  Systolic BP 342 876 811  Diastolic BP 572 90 82  Wt. (Lbs) 181.13 175 173.6  BMI 24.56 23.73 23.54    Physical Exam  Constitutional: He is oriented to person, place, and time.  Chronically ill-appearing male in no acute distress.  Cardiovascular:  DP pulse 1-2+ bilaterally.  Could not appreciated PT pulses.  Pulmonary/Chest: Effort normal.  Neurological: He is alert and oriented to person, place, and time.  Skin:  Approximately 1 cm raised skin lesion noted on the chin (L). See image.  Psychiatric: He has a normal mood and affect.  Vitals reviewed.   Lab Results  Component Value Date   WBC 9.0 05/04/2015   HGB 13.5 05/05/2015   HCT 43.8 05/04/2015   PLT 166 05/04/2015   GLUCOSE 98 05/17/2015   CHOL 149 05/17/2015   TRIG 93.0 05/17/2015   HDL 44.70 05/17/2015   LDLCALC 86 05/17/2015   ALT 12 05/17/2015  AST 20 05/17/2015   NA 137 05/17/2015   K 4.9 05/17/2015   CL 102 05/17/2015   CREATININE 1.21 05/17/2015   BUN 15 05/17/2015   CO2 25 05/17/2015   PSA 0.71 05/17/2015   INR 0.99 05/04/2015   HGBA1C 5.8 05/04/2015    Assessment & Plan:   Problem List Items Addressed This Visit    Essential hypertension - Primary    Established problem, stable.  BP slightly elevated today. Patient states that this is secondary to being in office. He states that it was normal when he checked earlier this morning. Continue Norvasc 5 mg daily.      Relevant Orders   Comp Met (CMET)   COPD (chronic obstructive pulmonary disease) (HCC)    Stable. He is now on Advair. Continue Advair and Spiriva.      Hyperlipidemia    Stable. Lipid panel today. Continue Lipitor.      Relevant Orders   Lipid  Profile   Claudication of both lower extremities ([place])    New problem. Patient describing symptoms of claudication. Suspect PAD. Sending to vascular for evaluation.      Relevant Orders   Ambulatory referral to Vascular Surgery   Basal cell carcinoma    New problem. Skin lesion appears to be consistent with basal cell carcinoma. Sending to dermatology and needs excision.      Relevant Orders   Ambulatory referral to Dermatology    Other Visit Diagnoses   None.    Follow-up: 6 months.  [place]

## 2015-11-15 NOTE — Assessment & Plan Note (Signed)
Stable. He is now on Advair. Continue Advair and Spiriva.

## 2015-11-15 NOTE — Patient Instructions (Signed)
We will call with the referrals.  Continue your current medications.  Follow up in 6 months.  Take care  Dr. Lacinda Axon

## 2015-11-18 DIAGNOSIS — C4491 Basal cell carcinoma of skin, unspecified: Secondary | ICD-10-CM | POA: Diagnosis not present

## 2015-11-18 DIAGNOSIS — C44319 Basal cell carcinoma of skin of other parts of face: Secondary | ICD-10-CM | POA: Diagnosis not present

## 2015-11-18 DIAGNOSIS — D485 Neoplasm of uncertain behavior of skin: Secondary | ICD-10-CM | POA: Diagnosis not present

## 2015-11-18 DIAGNOSIS — L578 Other skin changes due to chronic exposure to nonionizing radiation: Secondary | ICD-10-CM | POA: Diagnosis not present

## 2015-12-07 DIAGNOSIS — I70213 Atherosclerosis of native arteries of extremities with intermittent claudication, bilateral legs: Secondary | ICD-10-CM | POA: Diagnosis not present

## 2015-12-07 DIAGNOSIS — E785 Hyperlipidemia, unspecified: Secondary | ICD-10-CM | POA: Diagnosis not present

## 2015-12-07 DIAGNOSIS — N189 Chronic kidney disease, unspecified: Secondary | ICD-10-CM | POA: Diagnosis not present

## 2015-12-07 DIAGNOSIS — F17200 Nicotine dependence, unspecified, uncomplicated: Secondary | ICD-10-CM | POA: Diagnosis not present

## 2015-12-07 DIAGNOSIS — I10 Essential (primary) hypertension: Secondary | ICD-10-CM | POA: Diagnosis not present

## 2015-12-15 ENCOUNTER — Telehealth: Payer: Self-pay | Admitting: Internal Medicine

## 2015-12-15 MED ORDER — NYSTATIN 100000 UNIT/ML MT SUSP: 5.0000 mL | Freq: Four times a day (QID) | OROMUCOSAL | 0 refills | Status: DC

## 2015-12-15 NOTE — Telephone Encounter (Signed)
Patient calling and has thrush in his mouth and needs medicine called into [facility] on [address]. Please call patient.

## 2015-12-15 NOTE — Telephone Encounter (Signed)
Per DR, send in Nystatin for pt for thrush. 63m QID. Sent to pharmacy. Pt informed. Also informed to make sure he is rinsing his mouth after every use of inhaler.   DK pt is asking for any other advise. States he thinks it is the advair doing this. Informed pt to rinse freq after using. He states he rinses twice everytime. Please advise.

## 2015-12-16 NOTE — Telephone Encounter (Signed)
Ok, also Listerine twice daily

## 2015-12-16 NOTE — Telephone Encounter (Signed)
Pt informed. Also recommended eating yogurt since he has freq thrush. Pt agreed. Nothing further needed.

## 2015-12-22 ENCOUNTER — Encounter: Payer: Self-pay | Admitting: *Deleted

## 2015-12-22 ENCOUNTER — Ambulatory Visit: Payer: PPO | Admitting: Internal Medicine

## 2016-01-03 DIAGNOSIS — C44319 Basal cell carcinoma of skin of other parts of face: Secondary | ICD-10-CM | POA: Diagnosis not present

## 2016-01-03 DIAGNOSIS — L908 Other atrophic disorders of skin: Secondary | ICD-10-CM | POA: Diagnosis not present

## 2016-01-03 DIAGNOSIS — L814 Other melanin hyperpigmentation: Secondary | ICD-10-CM | POA: Diagnosis not present

## 2016-01-03 DIAGNOSIS — L578 Other skin changes due to chronic exposure to nonionizing radiation: Secondary | ICD-10-CM | POA: Diagnosis not present

## 2016-01-06 DIAGNOSIS — F17200 Nicotine dependence, unspecified, uncomplicated: Secondary | ICD-10-CM | POA: Diagnosis not present

## 2016-01-06 DIAGNOSIS — E785 Hyperlipidemia, unspecified: Secondary | ICD-10-CM | POA: Diagnosis not present

## 2016-01-06 DIAGNOSIS — N189 Chronic kidney disease, unspecified: Secondary | ICD-10-CM | POA: Diagnosis not present

## 2016-01-06 DIAGNOSIS — I70213 Atherosclerosis of native arteries of extremities with intermittent claudication, bilateral legs: Secondary | ICD-10-CM | POA: Diagnosis not present

## 2016-01-06 DIAGNOSIS — I10 Essential (primary) hypertension: Secondary | ICD-10-CM | POA: Diagnosis not present

## 2016-01-06 DIAGNOSIS — M79609 Pain in unspecified limb: Secondary | ICD-10-CM | POA: Diagnosis not present

## 2016-01-06 DIAGNOSIS — I739 Peripheral vascular disease, unspecified: Secondary | ICD-10-CM | POA: Diagnosis not present

## 2016-01-18 ENCOUNTER — Ambulatory Visit (INDEPENDENT_AMBULATORY_CARE_PROVIDER_SITE_OTHER): Payer: PPO | Admitting: Internal Medicine

## 2016-01-18 ENCOUNTER — Encounter: Payer: Self-pay | Admitting: Internal Medicine

## 2016-01-18 VITALS — BP 134/94 | HR 80 | Ht 72.0 in | Wt 181.0 lb

## 2016-01-18 DIAGNOSIS — J439 Emphysema, unspecified: Secondary | ICD-10-CM

## 2016-01-18 NOTE — Patient Instructions (Signed)
Continue inhaled meds as prescribed Smoking cessation strongly advised  Chronic Obstructive Pulmonary Disease Chronic obstructive pulmonary disease (COPD) is a common lung condition in which airflow from the lungs is limited. COPD is a general term that can be used to describe many different lung problems that limit airflow, including both chronic bronchitis and emphysema. If you have COPD, your lung function will probably never return to normal, but there are measures you can take to improve lung function and make yourself feel better. CAUSES   Smoking (common).  Exposure to secondhand smoke.  Genetic problems.  Chronic inflammatory lung diseases or recurrent infections. SYMPTOMS  Shortness of breath, especially with physical activity.  Deep, persistent (chronic) cough with a large amount of thick mucus.  Wheezing.  Rapid breaths (tachypnea).  Gray or bluish discoloration (cyanosis) of the skin, especially in your fingers, toes, or lips.  Fatigue.  Weight loss.  Frequent infections or episodes when breathing symptoms become much worse (exacerbations).  Chest tightness. DIAGNOSIS Your health care provider will take a medical history and perform a physical examination to diagnose COPD. Additional tests for COPD may include:  Lung (pulmonary) function tests.  Chest X-ray.  CT scan.  Blood tests. TREATMENT  Treatment for COPD may include:  Inhaler and nebulizer medicines. These help manage the symptoms of COPD and make your breathing more comfortable.  Supplemental oxygen. Supplemental oxygen is only helpful if you have a low oxygen level in your blood.  Exercise and physical activity. These are beneficial for nearly all people with COPD.  Lung surgery or transplant.  Nutrition therapy to gain weight, if you are underweight.  Pulmonary rehabilitation. This may involve working with a team of health care providers and specialists, such as respiratory, occupational,  and physical therapists. HOME CARE INSTRUCTIONS  Take all medicines (inhaled or pills) as directed by your health care provider.  Avoid over-the-counter medicines or cough syrups that dry up your airway (such as antihistamines) and slow down the elimination of secretions unless instructed otherwise by your health care provider.  If you are a smoker, the most important thing that you can do is stop smoking. Continuing to smoke will cause further lung damage and breathing trouble. Ask your health care provider for help with quitting smoking. He or she can direct you to community resources or hospitals that provide support.  Avoid exposure to irritants such as smoke, chemicals, and fumes that aggravate your breathing.  Use oxygen therapy and pulmonary rehabilitation if directed by your health care provider. If you require home oxygen therapy, ask your health care provider whether you should purchase a pulse oximeter to measure your oxygen level at home.  Avoid contact with individuals who have a contagious illness.  Avoid extreme temperature and humidity changes.  Eat healthy foods. Eating smaller, more frequent meals and resting before meals may help you maintain your strength.  Stay active, but balance activity with periods of rest. Exercise and physical activity will help you maintain your ability to do things you want to do.  Preventing infection and hospitalization is very important when you have COPD. Make sure to receive all the vaccines your health care provider recommends, especially the pneumococcal and influenza vaccines. Ask your health care provider whether you need a pneumonia vaccine.  Learn and use relaxation techniques to manage stress.  Learn and use controlled breathing techniques as directed by your health care provider. Controlled breathing techniques include:  Pursed lip breathing. Start by breathing in (inhaling) through your nose for  1 second. Then, purse your lips as  if you were going to whistle and breathe out (exhale) through the pursed lips for 2 seconds.  Diaphragmatic breathing. Start by putting one hand on your abdomen just above your waist. Inhale slowly through your nose. The hand on your abdomen should move out. Then purse your lips and exhale slowly. You should be able to feel the hand on your abdomen moving in as you exhale.  Learn and use controlled coughing to clear mucus from your lungs. Controlled coughing is a series of short, progressive coughs. The steps of controlled coughing are: 1. Lean your head slightly forward. 2. Breathe in deeply using diaphragmatic breathing. 3. Try to hold your breath for 3 seconds. 4. Keep your mouth slightly open while coughing twice. 5. Spit any mucus out into a tissue. 6. Rest and repeat the steps once or twice as needed. SEEK MEDICAL CARE IF:  You are coughing up more mucus than usual.  There is a change in the color or thickness of your mucus.  Your breathing is more labored than usual.  Your breathing is faster than usual. SEEK IMMEDIATE MEDICAL CARE IF:  You have shortness of breath while you are resting.  You have shortness of breath that prevents you from:  Being able to talk.  Performing your usual physical activities.  You have chest pain lasting longer than 5 minutes.  Your skin color is more cyanotic than usual.  You measure low oxygen saturations for longer than 5 minutes with a pulse oximeter. MAKE SURE YOU:  Understand these instructions.  Will watch your condition.  Will get help right away if you are not doing well or get worse.   This information is not intended to replace advice given to you by your health care provider. Make sure you discuss any questions you have with your health care provider.   Document Released: 01/18/2005 Document Revised: 05/01/2014 Document Reviewed: 12/05/2012 Elsevier Interactive Patient Education [employer].

## 2016-01-18 NOTE — Progress Notes (Signed)
[facility] Consultation     Date: 01/18/2016,   MRN# [id number] Kevin Gutierrez [birth year] Code Status:  Code Status History    Date Active Date Inactive Code Status Order ID Comments User Context   05/04/2015  3:40 AM 05/05/2015  6:02 PM Full Code [id number]  Saundra Shelling, MD Inpatient     Hosp day:'@LENGTHOFSTAYDAYS'$ @ Referring MD: '@ATDPROV'$ @     PCP:      AdmissionWeight: 181 lb (82.1 kg)                 CurrentWeight: 181 lb (82.1 kg) Kevin Gutierrez is a 65 y.o. old male seen in consultation for hsopital follow up for hemoptysis.   CHIEF COMPLAINT:   Follow up coughing and wheezing   HISTORY OF PRESENT ILLNESS   Patient breathing comfortably, no acute issues, still smokes  No fevers chills at this time No signs of infection at this time  Patient refuses to wear oxygen at night time due to cost   CT chest shows emphysematous changes and Bronchial wall thickening   Repeat CT chows improvement of pneumonia, residual mucus still present  Doing well with inhaler therapy  PFT 05/2015 Ratio 60%  FEv1 59% predicted RD 118% DLCo 39%  Office SPIRO 01/18/16 Ratio60% FEv1 1.73 L 47%   Fev1 was 59% and reduced down to 47% currently   no signs of infection at this time Current Medication:   Current Outpatient Prescriptions:  .  acetaminophen (TYLENOL) 325 MG tablet, Take 650 mg by mouth every 6 (six) hours as needed for moderate pain, fever or headache., Disp: , Rfl:  .  albuterol (PROVENTIL HFA;VENTOLIN HFA) 108 (90 Base) MCG/ACT inhaler, Inhale 2 puffs into the lungs every 4 (four) hours as needed for wheezing or shortness of breath., Disp: 1 Inhaler, Rfl: 2 .  albuterol (PROVENTIL) (2.5 MG/3ML) 0.083% nebulizer solution, Take 3 mLs (2.5 mg total) by nebulization every 3 (three) hours as needed for wheezing or shortness of breath., Disp: 75 mL, Rfl: 12 .  amLODipine (NORVASC) 5 MG tablet, Take 1 tablet (5 mg total) by mouth daily., Disp: 90 tablet, Rfl:  3 .  aspirin EC 81 MG tablet, Take 1 tablet (81 mg total) by mouth daily., Disp: , Rfl:  .  atorvastatin (LIPITOR) 20 MG tablet, Take 1 tablet (20 mg total) by mouth daily., Disp: 90 tablet, Rfl: 3 .  Fluticasone-Salmeterol (ADVAIR DISKUS) 250-50 MCG/DOSE AEPB, Inhale 1 puff into the lungs 2 (two) times daily., Disp: 60 each, Rfl: 5 .  nystatin (MYCOSTATIN) 100000 UNIT/ML suspension, Take 5 mLs (500,000 Units total) by mouth 4 (four) times daily., Disp: 140 mL, Rfl: 0 .  pantoprazole (PROTONIX) 40 MG tablet, Take 1 tablet (40 mg total) by mouth daily., Disp: 90 tablet, Rfl: 2 .  tiotropium (SPIRIVA) 18 MCG inhalation capsule, Place 1 capsule (18 mcg total) into inhaler and inhale daily., Disp: 30 capsule, Rfl: 5     ALLERGIES   Review of patient's allergies indicates no known allergies.     REVIEW OF SYSTEMS   Review of Systems  Constitutional: Negative for chills, fever, malaise/fatigue and weight loss.  HENT: Negative for congestion.   Respiratory: Positive for cough. Negative for hemoptysis, sputum production, shortness of breath and wheezing.   Cardiovascular: Negative for chest pain and leg swelling.  Gastrointestinal: Negative for heartburn.  Neurological: Negative for dizziness.  All other systems reviewed and are negative.    VS: BP (!) 134/94 (BP Location: Left Arm,  Cuff Size: Normal)   Pulse 80   Ht 6' (1.829 m)   Wt 181 lb (82.1 kg)   SpO2 92%   BMI 24.55 kg/m      PHYSICAL EXAM   Physical Exam  Constitutional: He is oriented to person, place, and time. No distress.  Cardiovascular: Normal rate, regular rhythm and normal heart sounds.   No murmur heard. Pulmonary/Chest: Effort normal. No stridor. No respiratory distress. He has no wheezes. He has no rales.  Musculoskeletal: Normal range of motion. He exhibits no edema.  Neurological: He is alert and oriented to person, place, and time.  Psychiatric: He has a normal mood and affect.          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   Patient satisfied with Plan of action and management. All questions answered   Corrin Parker, M.D.  [facility]  Medical Director [place] Director [facility] Cardio-Pulmonary Department

## 2016-01-26 ENCOUNTER — Other Ambulatory Visit: Payer: Self-pay | Admitting: Internal Medicine

## 2016-01-26 DIAGNOSIS — J449 Chronic obstructive pulmonary disease, unspecified: Secondary | ICD-10-CM

## 2016-02-21 ENCOUNTER — Encounter: Payer: Self-pay | Admitting: Family Medicine

## 2016-02-21 ENCOUNTER — Ambulatory Visit (INDEPENDENT_AMBULATORY_CARE_PROVIDER_SITE_OTHER): Payer: PPO

## 2016-02-21 ENCOUNTER — Ambulatory Visit (INDEPENDENT_AMBULATORY_CARE_PROVIDER_SITE_OTHER): Payer: PPO | Admitting: Family Medicine

## 2016-02-21 VITALS — BP 164/78 | HR 94 | Temp 97.7°F | Wt 187.4 lb

## 2016-02-21 DIAGNOSIS — E559 Vitamin D deficiency, unspecified: Secondary | ICD-10-CM | POA: Diagnosis not present

## 2016-02-21 DIAGNOSIS — Z23 Encounter for immunization: Secondary | ICD-10-CM

## 2016-02-21 DIAGNOSIS — M79661 Pain in right lower leg: Secondary | ICD-10-CM

## 2016-02-21 DIAGNOSIS — M5136 Other intervertebral disc degeneration, lumbar region: Secondary | ICD-10-CM | POA: Diagnosis not present

## 2016-02-21 DIAGNOSIS — M79662 Pain in left lower leg: Secondary | ICD-10-CM

## 2016-02-21 LAB — CBC WITH DIFFERENTIAL/PLATELET
BASOS PCT: 0.5 % (ref 0.0–3.0)
Basophils Absolute: 0 10*3/uL (ref 0.0–0.1)
EOS ABS: 0.2 10*3/uL (ref 0.0–0.7)
EOS PCT: 2.2 % (ref 0.0–5.0)
HEMATOCRIT: 47.3 % (ref 39.0–52.0)
HEMOGLOBIN: 15.7 g/dL (ref 13.0–17.0)
LYMPHS PCT: 27.4 % (ref 12.0–46.0)
Lymphs Abs: 2.7 10*3/uL (ref 0.7–4.0)
MCHC: 33.3 g/dL (ref 30.0–36.0)
MCV: 94 fl (ref 78.0–100.0)
MONO ABS: 0.7 10*3/uL (ref 0.1–1.0)
Monocytes Relative: 7.1 % (ref 3.0–12.0)
NEUTROS ABS: 6.2 10*3/uL (ref 1.4–7.7)
Neutrophils Relative %: 62.8 % (ref 43.0–77.0)
PLATELETS: 202 10*3/uL (ref 150.0–400.0)
RBC: 5.03 Mil/uL (ref 4.22–5.81)
RDW: 15.1 % (ref 11.5–15.5)
WBC: 9.8 10*3/uL (ref 4.0–10.5)

## 2016-02-21 LAB — VITAMIN D 25 HYDROXY (VIT D DEFICIENCY, FRACTURES): VITD: 12.11 ng/mL — ABNORMAL LOW (ref 30.00–100.00)

## 2016-02-21 LAB — SEDIMENTATION RATE: SED RATE: 24 mm/h — AB (ref 0–20)

## 2016-02-21 LAB — IRON: Iron: 99 ug/dL (ref 42–165)

## 2016-02-21 NOTE — Progress Notes (Signed)
Pre visit review using our clinic review tool, if applicable. No additional management support is needed unless otherwise documented below in the visit note. 

## 2016-02-21 NOTE — Patient Instructions (Addendum)
We will call regarding the referral.  Take care  Dr. Lacinda Axon

## 2016-02-22 ENCOUNTER — Telehealth: Payer: Self-pay | Admitting: *Deleted

## 2016-02-22 ENCOUNTER — Other Ambulatory Visit: Payer: Self-pay | Admitting: Family Medicine

## 2016-02-22 DIAGNOSIS — M79662 Pain in left lower leg: Principal | ICD-10-CM

## 2016-02-22 DIAGNOSIS — N200 Calculus of kidney: Secondary | ICD-10-CM

## 2016-02-22 DIAGNOSIS — M79661 Pain in right lower leg: Secondary | ICD-10-CM

## 2016-02-22 DIAGNOSIS — E559 Vitamin D deficiency, unspecified: Secondary | ICD-10-CM | POA: Insufficient documentation

## 2016-02-22 MED ORDER — VITAMIN D (ERGOCALCIFEROL) 1.25 MG (50000 UNIT) PO CAPS: 50000.0000 [IU] | ORAL_CAPSULE | ORAL | 0 refills | Status: DC

## 2016-02-22 NOTE — Telephone Encounter (Signed)
Fyi. See results note.

## 2016-02-22 NOTE — Assessment & Plan Note (Addendum)
Established problem, worsening. Unclear etiology given reported negative work up from Vascular.  ? Neurogenic claudication.  Xray of lumbar spine obtained today. Degenerative disc L2-L3. Nothing to account for patient's symptoms. Discussed cased with Sports medicine physician Dr. Tamala Julian [facility]). He recommended further laboratory studies - CBC, ESR, Vitamin D, Iron.  He suggested [place] if workup unrevealing.  Vitamin D low (could be contributing). Remainder of labs unremarkable. Will discuss referral to sports medicine.

## 2016-02-22 NOTE — Assessment & Plan Note (Signed)
New problem.  Repleting. Vitamin d ordered.

## 2016-02-22 NOTE — Telephone Encounter (Signed)
Pt stated that he would not need to be referred to a urologist if the issue was for his left kidney, he is aware of the issues of the kidney stones, he stated that if the issue is with the right kidney he would like the to be referred out.  Pt contact  [phone] If he will need to be referred out for the right kidney he preferred not [facility]

## 2016-02-22 NOTE — Telephone Encounter (Signed)
Pt Kevin Gutierrez stating that he has been experiencing cramps in his legs and in his hands. He wants Dr. Lacinda Axon to know this and states that it may help to diagnose him.

## 2016-02-22 NOTE — Telephone Encounter (Signed)
Recommendations?

## 2016-02-22 NOTE — Telephone Encounter (Signed)
Repleting vitamin d, may benefit from seeing neurology or sports medicine.

## 2016-02-22 NOTE — Telephone Encounter (Signed)
Pt stated hat if the stone is in the right kidney he is okay with referral to urologist, but if it is in the left he would like to hold off. This is due to the fact he has had it lithotripsy procedure three times with no success.

## 2016-02-22 NOTE — Progress Notes (Signed)
Subjective:  Patient ID: Kevin Gutierrez, male    DOB: December 26, 1950  Age: 65 y.o. MRN: [id number]  CC: Leg pain  HPI:  65 year old male with tobacco abuse, COPD, CKD, COPD, hypertension, hyperlipidemia presents with complaints of lower leg pain.  Patient has endorsed this to me previously. I felt that this was secondary to PAD/claudication. He has seen vascular and has had unremarkable [place] per his report (records unavailable to me).   He presents today with continue complaints of lower leg pain (bilateral). Pain is located predominantly at the anterior lower leg (from below the knee to the ankle). Does affect the calf some. Pain is severe and occurs with activity/ambulation. Patient states that it occurs after approximately 100 -150 ft of ambulation. Improves with rest. No associated numbness or tingling. No other associated symptoms. No other complaints at this time.  Social Hx   Social History   Social History  . Marital status: Married    Spouse name: N/A  . Number of children: N/A  . Years of education: N/A   Occupational History  . retired    Social History Main Topics  . Smoking status: Current Every Day Smoker    Packs/day: 0.25    Years: 50.00    Types: Cigarettes  . Smokeless tobacco: Never Used     Comment: Currently smoking 4 cigarettes daily  . Alcohol use No  . Drug use:     Types: Marijuana     Comment: Occasional marijuana use  . Sexual activity: Not Asked   Other Topics Concern  . None   Social History Narrative   Lives with family    Review of Systems  Constitutional: Negative.   Musculoskeletal: Negative for back pain.       Bilateral leg pain.   Neurological: Negative for numbness.   Objective:  BP (!) 164/78 (BP Location: Right Arm, Patient Position: Sitting, Cuff Size: Normal)   Pulse 94   Temp 97.7 F (36.5 C) (Oral)   Wt 187 lb 6 oz (85 kg)   SpO2 (!) 87%   BMI 25.41 kg/m   BP/Weight 02/21/2016 01/18/2016 3/95/3202  Systolic BP 334 356  861  Diastolic BP 78 94 683  Wt. (Lbs) 187.38 181 181.13  BMI 25.41 24.55 24.56    Physical Exam  Constitutional: He is oriented to person, place, and time. No distress.  Pulmonary/Chest: Effort normal.  Musculoskeletal:  Lower legs - no edema. nontender to palpation.   Neurological: He is alert and oriented to person, place, and time.  Psychiatric: He has a normal mood and affect.  Vitals reviewed.   Lab Results  Component Value Date   WBC 9.8 02/21/2016   HGB 15.7 02/21/2016   HCT 47.3 02/21/2016   PLT 202.0 02/21/2016   GLUCOSE 118 (H) 11/15/2015   CHOL 99 11/15/2015   TRIG 48.0 11/15/2015   HDL 37.40 (L) 11/15/2015   LDLCALC 52 11/15/2015   ALT 9 11/15/2015   AST 13 11/15/2015   NA 140 11/15/2015   K 4.0 11/15/2015   CL 105 11/15/2015   CREATININE 1.25 11/15/2015   BUN 19 11/15/2015   CO2 29 11/15/2015   PSA 0.71 05/17/2015   INR 0.99 05/04/2015   HGBA1C 5.8 05/04/2015    Assessment & Plan:   Problem List Items Addressed This Visit    Vitamin D deficiency    New problem.  Repleting. Vitamin d ordered.       Relevant Medications   Vitamin D, Ergocalciferol, (  DRISDOL) 50000 units CAPS capsule   Pain in both lower legs - Primary    Established problem, worsening. Unclear etiology given reported negative work up from Vascular.  ? Neurogenic claudication.  Xray of lumbar spine obtained today. Degenerative disc L2-L3. Nothing to account for patient's symptoms. Discussed cased with Sports medicine physician Dr. Tamala Julian [facility]). He recommended further laboratory studies - CBC, ESR, Vitamin D, Iron.  He suggested [place] if workup unrevealing.  Vitamin D low (could be contributing). Remainder of labs unremarkable. Will discuss referral to sports medicine.       Relevant Orders   DG Lumbar Spine Complete (Completed)   CBC w/Diff (Completed)   Vitamin D (25 hydroxy) (Completed)   Sed Rate (ESR) (Completed)   Iron (Completed)   Ambulatory referral to Physical  Therapy   Encounter for immunization   Flu vaccine HIGH DOSE PF (Completed)    Other Visit Diagnoses    Need for prophylactic vaccination against Streptococcus pneumoniae (pneumococcus)       Relevant Orders   Pneumococcal conjugate vaccine 13-valent (Completed)      Meds ordered this encounter  Medications  . Vitamin D, Ergocalciferol, (DRISDOL) 50000 units CAPS capsule    Sig: Take 1 capsule (50,000 Units total) by mouth every 7 (seven) days.    Dispense:  12 capsule    Refill:  0    Follow-up: PRN  [place]

## 2016-02-24 ENCOUNTER — Encounter: Payer: Self-pay | Admitting: Family Medicine

## 2016-02-25 ENCOUNTER — Encounter: Payer: Self-pay | Admitting: Urology

## 2016-02-25 ENCOUNTER — Ambulatory Visit (INDEPENDENT_AMBULATORY_CARE_PROVIDER_SITE_OTHER): Payer: PPO | Admitting: Urology

## 2016-02-25 VITALS — BP 144/80 | HR 87 | Ht 72.0 in | Wt 186.8 lb

## 2016-02-25 DIAGNOSIS — Q631 Lobulated, fused and horseshoe kidney: Secondary | ICD-10-CM

## 2016-02-25 DIAGNOSIS — N200 Calculus of kidney: Secondary | ICD-10-CM

## 2016-02-25 LAB — URINALYSIS, COMPLETE
BILIRUBIN UA: NEGATIVE
GLUCOSE, UA: NEGATIVE
Ketones, UA: NEGATIVE
Leukocytes, UA: NEGATIVE
Nitrite, UA: NEGATIVE
Specific Gravity, UA: 1.015 (ref 1.005–1.030)
UUROB: 1 mg/dL (ref 0.2–1.0)
pH, UA: 6 (ref 5.0–7.5)

## 2016-02-25 LAB — MICROSCOPIC EXAMINATION
Bacteria, UA: NONE SEEN
RBC, UA: >30 /hpf — AB (ref 0–?)

## 2016-02-25 NOTE — Progress Notes (Signed)
02/25/2016 9:55 AM   Kevin Gutierrez 03-17-1951 [id number] Dictation #1 [id number]  [id number]   Referring provider: Coral Spikes, DO [address] [place], [place] [address]  Chief Complaint  Patient presents with  . New Patient (Initial Visit)    nephrolithiasis    HPI: The patient is a 65 year old gentleman with a past medical history of nephrolithiasis and a horseshoe kidney presents today for evaluation of his stone burden. He had a x-ray of his lumbar spine which showed apparent bilateral staghorn calculi right greater than left. The patient knows that he has had stones, but did not have insurance so these were not addressed. He has had lithotripsy in the past for a large stone per the patient that this was not successful.  He also had a left open pyelolithotomy 1975. He does not believe that he has ever had a full comprehensive metabolic evaluation for stone formation. He does note that his stones are calcium based.   PMH: Past Medical History:  Diagnosis Date  . Chicken pox   . Hypertension   . Kidney stones   . Nephrolithiasis   . Peptic ulcer disease     Surgical History: Past Surgical History:  Procedure Laterality Date  . LITHOTRIPSY    . SPINAL CORD DECOMPRESSION  03/08/1994    Home Medications:    Medication List       Accurate as of 02/25/16  9:55 AM. Always use your most recent med list.          acetaminophen 325 MG tablet Commonly known as:  TYLENOL Take 650 mg by mouth every 6 (six) hours as needed for moderate pain, fever or headache.   albuterol (2.5 MG/3ML) 0.083% nebulizer solution Commonly known as:  PROVENTIL Take 3 mLs (2.5 mg total) by nebulization every 3 (three) hours as needed for wheezing or shortness of breath.   PROVENTIL HFA 108 (90 Base) MCG/ACT inhaler Generic drug:  albuterol INHALE TWO PUFFS BY MOUTH EVERY 4 HOURS AS NEEDED FOR WHEEZING OR  SHORTNESS  OF  BREATH   amLODipine 5 MG tablet Commonly known  as:  NORVASC Take 1 tablet (5 mg total) by mouth daily.   aspirin EC 81 MG tablet Take 1 tablet (81 mg total) by mouth daily.   atorvastatin 20 MG tablet Commonly known as:  LIPITOR Take 1 tablet (20 mg total) by mouth daily.   Fluticasone-Salmeterol 250-50 MCG/DOSE Aepb Commonly known as:  ADVAIR DISKUS Inhale 1 puff into the lungs 2 (two) times daily.   nystatin 100000 UNIT/ML suspension Commonly known as:  MYCOSTATIN Take 5 mLs (500,000 Units total) by mouth 4 (four) times daily.   pantoprazole 40 MG tablet Commonly known as:  PROTONIX Take 1 tablet (40 mg total) by mouth daily.   tiotropium 18 MCG inhalation capsule Commonly known as:  SPIRIVA Place 1 capsule (18 mcg total) into inhaler and inhale daily.   Vitamin D (Ergocalciferol) 50000 units Caps capsule Commonly known as:  DRISDOL Take 1 capsule (50,000 Units total) by mouth every 7 (seven) days.       Allergies: No Known Allergies  Family History: Family History  Problem Relation Age of Onset  . Diabetes Mellitus II Brother   . Lung cancer Brother   . Heart disease Brother   . Hyperlipidemia Brother   . Heart disease Mother   . Stroke Father   . Hypertension Brother     Social History:  reports that he has been smoking Cigarettes.  He has a 12.50 pack-year smoking history. He has never used smokeless tobacco. He reports that he uses drugs, including Marijuana. He reports that he does not drink alcohol.  ROS:                                        Physical Exam: BP (!) 144/80   Pulse 87   Ht 6' (1.829 m)   Wt 186 lb 12.8 oz (84.7 kg)   BMI 25.33 kg/m   Constitutional:  Alert and oriented, No acute distress. HEENT: [place] AT, moist mucus membranes.  Trachea midline, no masses. Cardiovascular: No clubbing, cyanosis, or edema. Respiratory: Normal respiratory effort, no increased work of breathing. GI: Abdomen is soft, nontender, nondistended, no abdominal masses GU: No CVA  tenderness. Large left subcostal scar noted. Skin: No rashes, bruises or suspicious lesions. Lymph: No cervical or inguinal adenopathy. Neurologic: Grossly intact, no focal deficits, moving all 4 extremities. Psychiatric: Normal mood and affect.  Laboratory Data: Lab Results  Component Value Date   WBC 9.8 02/21/2016   HGB 15.7 02/21/2016   HCT 47.3 02/21/2016   MCV 94.0 02/21/2016   PLT 202.0 02/21/2016    Lab Results  Component Value Date   CREATININE 1.25 11/15/2015    Lab Results  Component Value Date   PSA 0.71 05/17/2015    No results found for: TESTOSTERONE  Lab Results  Component Value Date   HGBA1C 5.8 05/04/2015    Urinalysis    Component Value Date/Time   COLORURINE YELLOW (A) 05/04/2015 1556   APPEARANCEUR CLEAR (A) 05/04/2015 1556   LABSPEC 1.011 05/04/2015 1556   PHURINE 5.0 05/04/2015 1556   GLUCOSEU NEGATIVE 05/04/2015 1556   HGBUR 2+ (A) 05/04/2015 1556   BILIRUBINUR NEGATIVE 05/04/2015 1556   KETONESUR NEGATIVE 05/04/2015 1556   PROTEINUR NEGATIVE 05/04/2015 1556   NITRITE NEGATIVE 05/04/2015 1556   LEUKOCYTESUR NEGATIVE 05/04/2015 1556      Assessment & Plan:    1. Nephrolithiasis 2. Horseshoe kidney I discussed the x-ray findings with the patient. 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 I discussed the next step would be further evaluation with a CT scan to better evaluate the stone burden as well as renal parenchyma before deciding surgical intervention. We did briefly discuss that given his atypical anatomy with significant stone burden that a referral to a tertiary center may be in his best interest pending the CT results. We'll discuss this further at his next visit. The patient also would likely benefit from a comprehensive metabolic evaluation for his recurrent nephrolithiasis.   Return in about 2 weeks (around 03/10/2016) for after CT.  Nickie Retort,  MD  [facility] [address], [place], [place] [address] [phone]

## 2016-02-27 LAB — URINE CULTURE: ORGANISM ID, BACTERIA: NO GROWTH

## 2016-03-04 NOTE — Progress Notes (Deleted)
[name] [place], [place] [address] Phone: [phone] Subjective:    I'm seeing this patient by the request  of:  Coral Spikes, DO  CC: Leg pain  [id number]  Kevin Gutierrez is a 65 y.o. male coming in with complaint of leg pain. Seems to be bilateral. Past medical history is significant for COPD, chronic kidney disease. Patient was seen by primary care provider and there was a concern for peripheral artery disease. Patient was sent for an ABI in Doppler that was unremarkable. Patient states that unfortunately he continues to have pain of the lower legs mostly on the anterior aspect the legs. Some mild calf pain bilaterally. States that it severe and occurs more with activity and ambulation. Patient states that he can only walk 100-150 feet before the pain starts. Seems to get better with rest. Denies any numbness. Denies any swelling. Rates the severity of pain is 4 out of 10 but sometimes can be as bad as 9 out of 10 and has started to affect daily activities. Patient did have x-rays of the lower back. X-rays of the lumbar spine were independently visualized by me. Patient does some moderate degenerative disc disease at L2-L3 as well as findings significant for a staghorn kidney stone. Otherwise unremarkable.  patient also did have laboratory workup. Labs did show the patient did have a minor elevation an ESR and 24 and vitamin D was significantly low and 12.  Past Medical History:  Diagnosis Date  . Chicken pox   . Hypertension   . Kidney stones   . Nephrolithiasis   . Peptic ulcer disease    Past Surgical History:  Procedure Laterality Date  . LITHOTRIPSY    . SPINAL CORD DECOMPRESSION  03/08/1994   Social History   Social History  . Marital status: Married    Spouse name: N/A  . Number of children: N/A  . Years of education: N/A   Occupational History  . retired    Social History Main Topics  . Smoking status: Current Every  Day Smoker    Packs/day: 0.25    Years: 50.00    Types: Cigarettes  . Smokeless tobacco: Never Used     Comment: Currently smoking 4 cigarettes daily  . Alcohol use No  . Drug use:     Types: Marijuana     Comment: Occasional marijuana use  . Sexual activity: Not on file   Other Topics Concern  . Not on file   Social History Narrative   Lives with family   No Known Allergies Family History  Problem Relation Age of Onset  . Diabetes Mellitus II Brother   . Lung cancer Brother   . Heart disease Brother   . Hyperlipidemia Brother   . Heart disease Mother   . Stroke Father   . Hypertension Brother     Past medical history, social, surgical and family history all reviewed in electronic medical record.  No pertanent information unless stated regarding to the chief complaint.   Review of Systems:Review of systems updated and as accurate as of 03/04/16  No headache, visual changes, nausea, vomiting, diarrhea, constipation, dizziness, abdominal pain, skin rash, fevers, chills, night sweats, weight loss, swollen lymph nodes, body aches, joint swelling, muscle aches, chest pain, shortness of breath, mood changes.   Objective  There were no vitals taken for this visit. Systems examined below as of 03/04/16   General: No apparent distress alert and oriented x3  mood and affect normal, dressed appropriately.  HEENT: Pupils equal, extraocular movements intact  Respiratory: Patient's speak in full sentences and does not appear short of breath  Cardiovascular: No lower extremity edema, non tender, no erythema  Skin: Warm dry intact with no signs of infection or rash on extremities or on axial skeleton.  Abdomen: Soft nontender  Neuro: Cranial nerves II through XII are intact, neurovascularly intact in all extremities with 2+ DTRs and 2+ pulses.  Lymph: No lymphadenopathy of posterior or anterior cervical chain or axillae bilaterally.  Gait normal with good balance and coordination.    MSK:  Non tender with full range of motion and good stability and symmetric strength and tone of shoulders, elbows, wrist, hip, knee and ankles bilaterally.  Back Exam:  Inspection: Unremarkable  Motion: Flexion 45 deg, Extension 45 deg, Side Bending to 45 deg bilaterally,  Rotation to 45 deg bilaterally  SLR laying: Negative  XSLR laying: Negative  Palpable tenderness: None. FABER: negative. Sensory change: Gross sensation intact to all lumbar and sacral dermatomes.  Reflexes: 2+ at both patellar tendons, 2+ at achilles tendons, Babinski's downgoing.  Strength at foot  Plantar-flexion: 5/5 Dorsi-flexion: 5/5 Eversion: 5/5 Inversion: 5/5  Leg strength  Quad: 5/5 Hamstring: 5/5 Hip flexor: 5/5 Hip abductors: 5/5  Gait unremarkable.    Impression and Recommendations:     This case required medical decision making of moderate complexity.      Note: This dictation was prepared with Dragon dictation along with smaller phrase technology. Any transcriptional errors that result from this process are unintentional.

## 2016-03-06 ENCOUNTER — Ambulatory Visit: Payer: PPO | Admitting: Family Medicine

## 2016-03-09 ENCOUNTER — Ambulatory Visit
Admission: RE | Admit: 2016-03-09 | Discharge: 2016-03-09 | Disposition: A | Discharge status: Home / Self Care | Payer: PPO | Source: Ambulatory Visit | Attending: Urology | Admitting: Urology

## 2016-03-09 DIAGNOSIS — N132 Hydronephrosis with renal and ureteral calculous obstruction: Secondary | ICD-10-CM | POA: Diagnosis not present

## 2016-03-09 DIAGNOSIS — N261 Atrophy of kidney (terminal): Secondary | ICD-10-CM | POA: Diagnosis not present

## 2016-03-09 DIAGNOSIS — N200 Calculus of kidney: Secondary | ICD-10-CM

## 2016-03-09 DIAGNOSIS — N202 Calculus of kidney with calculus of ureter: Secondary | ICD-10-CM | POA: Diagnosis not present

## 2016-03-09 DIAGNOSIS — Q631 Lobulated, fused and horseshoe kidney: Secondary | ICD-10-CM | POA: Insufficient documentation

## 2016-03-09 DIAGNOSIS — I700 Atherosclerosis of aorta: Secondary | ICD-10-CM | POA: Diagnosis not present

## 2016-03-10 ENCOUNTER — Ambulatory Visit: Payer: PPO

## 2016-03-13 NOTE — Progress Notes (Signed)
[facility] [place], [place] [address] Phone: [phone] Subjective:    I'm seeing this patient by the request  of:  Coral Spikes, DO  CC: Leg pain  [id number]  Kevin Gutierrez is a 65 y.o. male coming in with complaint of leg pain. Seems to be bilateral. Past medical history is significant for COPD, chronic kidney disease. Patient was seen by primary care provider and there was a concern for peripheral artery disease. Patient was sent for an ABI in Doppler that was unremarkable. Patient states that unfortunately he continues to have pain of the lower legs mostly on the anterior aspect the legs. Some mild calf pain bilaterally. States that it severe and occurs more with activity and ambulation. Patient states that he can only walk 100-150 feet before the pain starts. Seems to get better with rest. Denies any numbness. Denies any swelling. Rates the severity of pain is 4 out of 10 but sometimes can be as bad as 9 out of 10 and has started to affect daily activities. Patient did have x-rays of the lower back. X-rays of the lumbar spine were independently visualized by me. Patient does some moderate degenerative disc disease at L2-L3 as well as findings significant for a staghorn kidney stone. Otherwise unremarkable.  patient also did have laboratory workup. Labs did show the patient did have a minor elevation an ESR and 24 and vitamin D was significantly low at 12.  Still states that the pain seems to be worse with activity. Patient states after such things as even plane here though for long amount of time he seems to be spent.  Past Medical History:  Diagnosis Date  . Chicken pox   . Hypertension   . Kidney stones   . Nephrolithiasis   . Peptic ulcer disease    Past Surgical History:  Procedure Laterality Date  . LITHOTRIPSY    . SPINAL CORD DECOMPRESSION  03/08/1994   Social History   Social History  . Marital status: Married    Spouse  name: N/A  . Number of children: N/A  . Years of education: N/A   Occupational History  . retired    Social History Main Topics  . Smoking status: Current Every Day Smoker    Packs/day: 0.25    Years: 50.00    Types: Cigarettes  . Smokeless tobacco: Never Used     Comment: Currently smoking 4 cigarettes daily  . Alcohol use No  . Drug use:     Types: Marijuana     Comment: Occasional marijuana use  . Sexual activity: Not Asked   Other Topics Concern  . None   Social History Narrative   Lives with family   No Known Allergies Family History  Problem Relation Age of Onset  . Diabetes Mellitus II Brother   . Lung cancer Brother   . Heart disease Brother   . Hyperlipidemia Brother   . Heart disease Mother   . Stroke Father   . Hypertension Brother     Past medical history, social, surgical and family history all reviewed in electronic medical record.  No pertanent information unless stated regarding to the chief complaint.   Review of Systems:Review of systems updated and as accurate as of 03/14/16  No headache, visual changes, nausea, vomiting, diarrhea, constipation, dizziness, abdominal pain, skin rash, fevers, chills, night sweats, weight loss, swollen lymph nodes,chest pain, shortness of breath, mood changes.   Objective  Blood pressure  128/84, pulse 92, height 6' (1.829 m), weight 187 lb (84.8 kg), SpO2 90 %. Systems examined below as of 03/14/16   General: No apparent distress alert and oriented x3 mood and affect normal, dressed appropriately. Large beard HEENT: Pupils equal, extraocular movements intact  Respiratory: Patient's speak in full sentences and does not appear short of breath  Cardiovascular: No lower extremity edema, non tender, no erythema  Skin: Warm dry intact with no signs of infection or rash on extremities or on axial skeleton.  Abdomen: Soft nontender  Neuro: Cranial nerves II through XII are intact, neurovascularly intact in all extremities  with 2+ DTRs and 2+ pulses.  Lymph: No lymphadenopathy of posterior or anterior cervical chain or axillae bilaterally.  Gait normal with good balance and coordination.  MSK:  Non tender with full range of motion and good stability and symmetric strength and tone of shoulders, elbows, wrist, hip, knee and ankles bilaterally. Mild arthritic changes of multiple joints Back Exam:  Inspection: Unremarkable  Motion: Flexion 45 deg, Extension 25 deg, Side Bending to 35 deg bilaterally,  Rotation to 35 deg bilaterally  SLR laying: Negative  XSLR laying: Negative  Palpable tenderness: Mild discomfort in the thoracolumbar juncture mostly on the right side as well as around L1-L2 on the right side. FABER: negative. Sensory change: Gross sensation intact to all lumbar and sacral dermatomes.  Reflexes: 2+ at both patellar tendons, 2+ at achilles tendons, Babinski's downgoing.  Strength at foot  Plantar-flexion: 5/5 Dorsi-flexion: 5/5 Eversion: 5/5 Inversion: 5/5  Leg strength  Quad: 5/5 Hamstring: 5/5 Hip flexor: 5/5 Hip abductors: 5/5  Gait unremarkable. Lower leg exam shows the patient does have some atrophy of the calf muscles bilaterally.  Limited muscular skeletal ultrasound was performed and interpreted by Lyndal Pulley  Limited ultrasound the patient's tibias bilaterally and show no significant bony normality. No increasing Doppler flow no cortical defect. Impression: Normal  Procedure note 25003; 15 minutes spent for Therapeutic exercises as stated in above notes.  This included exercises focusing on stretching, strengthening, with significant focus on eccentric aspects.  Low back exercises that included:  Pelvic tilt/bracing instruction to focus on control of the pelvic girdle and lower abdominal muscles  Glute strengthening exercises, focusing on proper firing of the glutes without engaging the low back muscles Proper stretching techniques for maximum relief for the hamstrings, hip  flexors, low back and some rotation where tolerated   Proper technique shown and discussed handout in great detail with ATC.  All questions were discussed and answered.      Impression and Recommendations:     This case required medical decision making of moderate complexity.      Note: This dictation was prepared with Dragon dictation along with smaller phrase technology. Any transcriptional errors that result from this process are unintentional.

## 2016-03-14 ENCOUNTER — Ambulatory Visit: Payer: Self-pay

## 2016-03-14 ENCOUNTER — Encounter: Payer: Self-pay | Admitting: Family Medicine

## 2016-03-14 ENCOUNTER — Ambulatory Visit (INDEPENDENT_AMBULATORY_CARE_PROVIDER_SITE_OTHER): Payer: PPO | Admitting: Family Medicine

## 2016-03-14 VITALS — BP 128/84 | HR 92 | Ht 72.0 in | Wt 187.0 lb

## 2016-03-14 DIAGNOSIS — M79604 Pain in right leg: Secondary | ICD-10-CM

## 2016-03-14 DIAGNOSIS — M79661 Pain in right lower leg: Secondary | ICD-10-CM | POA: Diagnosis not present

## 2016-03-14 DIAGNOSIS — M79605 Pain in left leg: Secondary | ICD-10-CM | POA: Diagnosis not present

## 2016-03-14 DIAGNOSIS — M5136 Other intervertebral disc degeneration, lumbar region: Secondary | ICD-10-CM

## 2016-03-14 DIAGNOSIS — M79662 Pain in left lower leg: Secondary | ICD-10-CM

## 2016-03-14 MED ORDER — GABAPENTIN 100 MG PO CAPS: 200.0000 mg | ORAL_CAPSULE | Freq: Every day | ORAL | 3 refills | Status: DC

## 2016-03-14 NOTE — Patient Instructions (Signed)
Good to see you  Continue once weekly vitamin D Turmeric '500mg'$  twice daily  Only prescription gabapentin '200mg'$  at night I think there is a concern it is coming from the back.  We will do exercises 3 times a week.  Stay active.  Decrease statin to 3 times a week.  See me again in 4 weeks.

## 2016-03-14 NOTE — Assessment & Plan Note (Signed)
Patient's x-rays do show degenerative disc disease. Seems to be very localized to the L2-L3. Possible concerning aspect that could give some mild radicular symptoms to the legs. We discussed with patient at great length. We'll continue to monitor how patient response. Patient will try conservative therapy otherwise. No significant improvement we will consider further workup.

## 2016-03-14 NOTE — Assessment & Plan Note (Signed)
Difficult at this time, likely multifactorial. I do believe that vitamin D deficiency and decreased muscle strength is plain somewhat of a role. Patient has been put on a statin with but with this hurting him more with activity at think it is less likely as well as it being on the extremities. We discussed with patient about the vascular testing being normal. Encourage patient to decrease tobacco abuse. Deathly can be plain a role. We discussed with patient about proper shoes. I do believe that there is a possibility for more of a neurologic component and started on gabapentin to help with some of the pain at night. We will see if that helped some of the pain as well throughout the day. Trial of topical anti-inflammatories but will avoid oral anti-inflammatories secondary to his kidney. Follow-up again in 4 weeks. If continued have pain we will consider EMG or possible further workup of his back.

## 2016-03-15 ENCOUNTER — Telehealth: Payer: Self-pay | Admitting: Family Medicine

## 2016-03-15 ENCOUNTER — Ambulatory Visit (INDEPENDENT_AMBULATORY_CARE_PROVIDER_SITE_OTHER): Payer: PPO | Admitting: Urology

## 2016-03-15 VITALS — Ht 72.0 in | Wt 188.3 lb

## 2016-03-15 DIAGNOSIS — N200 Calculus of kidney: Secondary | ICD-10-CM | POA: Diagnosis not present

## 2016-03-15 LAB — URINALYSIS, COMPLETE
Bilirubin, UA: NEGATIVE
GLUCOSE, UA: NEGATIVE
KETONES UA: NEGATIVE
Leukocytes, UA: NEGATIVE
NITRITE UA: NEGATIVE
SPEC GRAV UA: 1.02 (ref 1.005–1.030)
UUROB: 0.2 mg/dL (ref 0.2–1.0)
pH, UA: 5.5 (ref 5.0–7.5)

## 2016-03-15 LAB — MICROSCOPIC EXAMINATION: Bacteria, UA: NONE SEEN

## 2016-03-15 NOTE — Telephone Encounter (Signed)
The dose he is on should be fine.  Will not need to go up

## 2016-03-15 NOTE — Progress Notes (Signed)
03/15/2016 9:07 AM   Kevin Gutierrez 09/24/1950 [id number]  Referring provider: Coral Spikes, DO [id number] [place], [place] [address]  Chief Complaint  Patient presents with  . Follow-up    Nephrolithiasis    HPI: The patient is a 65 year old gentleman with a past medical history of nephrolithiasis and a horseshoe kidney presents today for evaluation of his stone burden. He had a x-ray of his lumbar spine which showed apparent bilateral staghorn calculi right greater than left. The patient knows that he has had stones, but did not have insurance so these were not addressed. He has had lithotripsy in the past for a large stone per the patient that this was not successful.  He also had a left open pyelolithotomy 1975. He does not believe that he has ever had a full comprehensive metabolic evaluation for stone formation. He does note that his stones are calcium based.    CT scan showed large stone burden bilaterally with atrophic left moiety Likely due to a obstructing 6 mm ureteral calculus. His right moiety also has a large stone burden as well.   PMH: Past Medical History:  Diagnosis Date  . Chicken pox   . Hypertension   . Kidney stones   . Nephrolithiasis   . Peptic ulcer disease     Surgical History: Past Surgical History:  Procedure Laterality Date  . LITHOTRIPSY    . SPINAL CORD DECOMPRESSION  03/08/1994    Home Medications:    Medication List       Accurate as of 03/15/16  9:07 AM. Always use your most recent med list.          acetaminophen 325 MG tablet Commonly known as:  TYLENOL Take 650 mg by mouth every 6 (six) hours as needed for moderate pain, fever or headache.   albuterol (2.5 MG/3ML) 0.083% nebulizer solution Commonly known as:  PROVENTIL Take 3 mLs (2.5 mg total) by nebulization every 3 (three) hours as needed for wheezing or shortness of breath.   PROVENTIL HFA 108 (90 Base) MCG/ACT inhaler Generic drug:  albuterol INHALE TWO  PUFFS BY MOUTH EVERY 4 HOURS AS NEEDED FOR WHEEZING OR  SHORTNESS  OF  BREATH   amLODipine 5 MG tablet Commonly known as:  NORVASC Take 1 tablet (5 mg total) by mouth daily.   aspirin EC 81 MG tablet Take 1 tablet (81 mg total) by mouth daily.   atorvastatin 20 MG tablet Commonly known as:  LIPITOR Take 1 tablet (20 mg total) by mouth daily.   Fluticasone-Salmeterol 250-50 MCG/DOSE Aepb Commonly known as:  ADVAIR DISKUS Inhale 1 puff into the lungs 2 (two) times daily.   gabapentin 100 MG capsule Commonly known as:  NEURONTIN Take 2 capsules (200 mg total) by mouth at bedtime.   nystatin 100000 UNIT/ML suspension Commonly known as:  MYCOSTATIN Take 5 mLs (500,000 Units total) by mouth 4 (four) times daily.   pantoprazole 40 MG tablet Commonly known as:  PROTONIX Take 1 tablet (40 mg total) by mouth daily.   tiotropium 18 MCG inhalation capsule Commonly known as:  SPIRIVA Place 1 capsule (18 mcg total) into inhaler and inhale daily.   Vitamin D (Ergocalciferol) 50000 units Caps capsule Commonly known as:  DRISDOL Take 1 capsule (50,000 Units total) by mouth every 7 (seven) days.       Allergies: No Known Allergies  Family History: Family History  Problem Relation Age of Onset  . Diabetes Mellitus II Brother   .  Lung cancer Brother   . Heart disease Brother   . Hyperlipidemia Brother   . Heart disease Mother   . Stroke Father   . Hypertension Brother     Social History:  reports that he has been smoking Cigarettes.  He has a 12.50 pack-year smoking history. He has never used smokeless tobacco. He reports that he uses drugs, including Marijuana. He reports that he does not drink alcohol.  ROS: UROLOGY Frequent Urination?: No Hard to postpone urination?: No Burning/pain with urination?: No Get up at night to urinate?: Yes Leakage of urine?: No Urine stream starts and stops?: No Trouble starting stream?: No Do you have to strain to urinate?: No Blood in  urine?: No Urinary tract infection?: No Sexually transmitted disease?: No Injury to kidneys or bladder?: No Painful intercourse?: No Weak stream?: No Erection problems?: Yes Penile pain?: No  Gastrointestinal Nausea?: No Vomiting?: No Indigestion/heartburn?: No Diarrhea?: No Constipation?: No  Constitutional Fever: No Night sweats?: No Weight loss?: No Fatigue?: No  Skin Skin rash/lesions?: No Itching?: No  Eyes Blurred vision?: Yes Double vision?: No  Ears/Nose/Throat Sore throat?: No Sinus problems?: No  Hematologic/Lymphatic Swollen glands?: No Easy bruising?: No  Cardiovascular Leg swelling?: No Chest pain?: No  Respiratory Cough?: Yes Shortness of breath?: Yes  Endocrine Excessive thirst?: No  Musculoskeletal Back pain?: No Joint pain?: No  Neurological Headaches?: No Dizziness?: No  Psychologic Depression?: No Anxiety?: No  Physical Exam: Ht 6' (1.829 m)   Wt 188 lb 4.8 oz (85.4 kg)   BMI 25.54 kg/m   Constitutional:  Alert and oriented, No acute distress. HEENT: [place] AT, moist mucus membranes.  Trachea midline, no masses. Cardiovascular: No clubbing, cyanosis, or edema. Respiratory: Normal respiratory effort, no increased work of breathing. GI: Abdomen is soft, nontender, nondistended, no abdominal masses GU: No CVA tenderness.  Skin: No rashes, bruises or suspicious lesions. Lymph: No cervical or inguinal adenopathy. Neurologic: Grossly intact, no focal deficits, moving all 4 extremities. Psychiatric: Normal mood and affect.  Laboratory Data: Lab Results  Component Value Date   WBC 9.8 02/21/2016   HGB 15.7 02/21/2016   HCT 47.3 02/21/2016   MCV 94.0 02/21/2016   PLT 202.0 02/21/2016    Lab Results  Component Value Date   CREATININE 1.25 11/15/2015    Lab Results  Component Value Date   PSA 0.71 05/17/2015    No results found for: TESTOSTERONE  Lab Results  Component Value Date   HGBA1C 5.8 05/04/2015     Urinalysis    Component Value Date/Time   COLORURINE YELLOW (A) 05/04/2015 1556   APPEARANCEUR Clear 02/25/2016 0931   LABSPEC 1.011 05/04/2015 1556   PHURINE 5.0 05/04/2015 1556   GLUCOSEU Negative 02/25/2016 0931   HGBUR 2+ (A) 05/04/2015 1556   BILIRUBINUR Negative 02/25/2016 0931   KETONESUR NEGATIVE 05/04/2015 1556   PROTEINUR 1+ (A) 02/25/2016 0931   PROTEINUR NEGATIVE 05/04/2015 1556   NITRITE Negative 02/25/2016 0931   NITRITE NEGATIVE 05/04/2015 1556   LEUKOCYTESUR Negative 02/25/2016 0931    Pertinent Imaging: CLINICAL DATA:  Follow-up evaluation.  Microscopic hematuria.  EXAM: CT ABDOMEN AND PELVIS WITHOUT CONTRAST  TECHNIQUE: Multidetector CT imaging of the abdomen and pelvis was performed following the standard protocol without IV contrast.  COMPARISON:  Chest CT 06/07/2015.  FINDINGS: Lower chest: Normal heart size. Re- demonstrated bandlike atelectasis/scarring within the lingula, left lower and right lower lobes. No pleural effusion.  Hepatobiliary: Liver is normal in size and contour. Gallbladder is unremarkable.  Pancreas: Unremarkable  Spleen: Unremarkable  Adrenals/Urinary Tract: The adrenal glands are normal. 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 The left moiety is markedly atrophic. Multiple bilateral renal stones are demonstrated. Staghorn calculus within the right renal collecting system. Lamellated stone within the inferior aspect of the left moiety measures up to 2 cm. There is mild right pelviectasis and thickening of the right renal collecting system urothelium. There is a 6 mm stone within the distal left ureter (image 75; series 2). Urinary bladder is unremarkable.  Stomach/Bowel: No abnormal bowel wall thickening or evidence for bowel obstruction. No free fluid or free intraperitoneal air. Normal morphology of the stomach.  Vascular/Lymphatic: Peripheral calcified atherosclerotic plaque involving the abdominal aorta.  No retroperitoneal lymphadenopathy.  Reproductive: Central dystrophic calcifications in the prostate.  Other: None.  Musculoskeletal: Lumbar spine degenerative changes. No aggressive or acute appearing osseous lesions. Sclerosis within the femoral heads bilaterally raising the possibility of mild AVN. Degenerative disc disease most pronounced L2-3 with posterior disc protrusion.  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 The left moiety is markedly atrophic. Extensive bilateral renal calculi. Staghorn calculus within the right renal collecting system with thickening of the right urothelium, surrounding fat stranding and mild right pelviectasis.  6 mm stone within the distal left ureter.  Aortic atherosclerosis.  Assessment & Plan:    1. Nephrolithiasis 2. Horsheshoe kidney I discussed the patient that his right renal moiety of his horseshoe kidney is his the only functional renal parenchyma. I discussed his large stone burden on the sides should be addressed with percutaneous nephrolithotomy. I believe he would be best served at a tertiary care center due to his abnormal anatomy with his horseshoe kidney. The patient is agreeable to proceeding. We will refer him to Dr. Kandee Keen at [facility].  Nickie Retort, MD  [facility] [address] [place], [place] [address] [phone]

## 2016-03-15 NOTE — Telephone Encounter (Signed)
Pt wife called in said that pt has kidney issue and wants to know if the gabapentin is safe for him to take. She would like a call back from the nurse   [phone]

## 2016-03-15 NOTE — Telephone Encounter (Signed)
Pt made aware & to take 1 capsule at bedtime instead of 2.

## 2016-03-19 ENCOUNTER — Encounter: Payer: Self-pay | Admitting: Emergency Medicine

## 2016-03-19 ENCOUNTER — Emergency Department: Payer: PPO

## 2016-03-19 ENCOUNTER — Inpatient Hospital Stay
Admission: EM | Admit: 2016-03-19 | Discharge: 2016-03-20 | DRG: 190 | Disposition: A | Discharge status: Home / Self Care | Payer: PPO | Attending: Internal Medicine | Admitting: Internal Medicine

## 2016-03-19 DIAGNOSIS — I10 Essential (primary) hypertension: Secondary | ICD-10-CM | POA: Diagnosis present

## 2016-03-19 DIAGNOSIS — R0602 Shortness of breath: Secondary | ICD-10-CM

## 2016-03-19 DIAGNOSIS — E559 Vitamin D deficiency, unspecified: Secondary | ICD-10-CM | POA: Diagnosis not present

## 2016-03-19 DIAGNOSIS — J441 Chronic obstructive pulmonary disease with (acute) exacerbation: Principal | ICD-10-CM | POA: Diagnosis present

## 2016-03-19 DIAGNOSIS — Z85828 Personal history of other malignant neoplasm of skin: Secondary | ICD-10-CM

## 2016-03-19 DIAGNOSIS — I129 Hypertensive chronic kidney disease with stage 1 through stage 4 chronic kidney disease, or unspecified chronic kidney disease: Secondary | ICD-10-CM | POA: Diagnosis present

## 2016-03-19 DIAGNOSIS — Z7982 Long term (current) use of aspirin: Secondary | ICD-10-CM | POA: Diagnosis not present

## 2016-03-19 DIAGNOSIS — Z79899 Other long term (current) drug therapy: Secondary | ICD-10-CM | POA: Diagnosis not present

## 2016-03-19 DIAGNOSIS — J9601 Acute respiratory failure with hypoxia: Secondary | ICD-10-CM | POA: Diagnosis not present

## 2016-03-19 DIAGNOSIS — F17210 Nicotine dependence, cigarettes, uncomplicated: Secondary | ICD-10-CM | POA: Diagnosis not present

## 2016-03-19 DIAGNOSIS — E785 Hyperlipidemia, unspecified: Secondary | ICD-10-CM | POA: Diagnosis present

## 2016-03-19 DIAGNOSIS — N182 Chronic kidney disease, stage 2 (mild): Secondary | ICD-10-CM | POA: Diagnosis not present

## 2016-03-19 DIAGNOSIS — Z8249 Family history of ischemic heart disease and other diseases of the circulatory system: Secondary | ICD-10-CM | POA: Diagnosis not present

## 2016-03-19 DIAGNOSIS — R0902 Hypoxemia: Secondary | ICD-10-CM

## 2016-03-19 DIAGNOSIS — Z8711 Personal history of peptic ulcer disease: Secondary | ICD-10-CM | POA: Diagnosis not present

## 2016-03-19 DIAGNOSIS — J9621 Acute and chronic respiratory failure with hypoxia: Secondary | ICD-10-CM | POA: Diagnosis not present

## 2016-03-19 DIAGNOSIS — J40 Bronchitis, not specified as acute or chronic: Secondary | ICD-10-CM | POA: Diagnosis not present

## 2016-03-19 DIAGNOSIS — R05 Cough: Secondary | ICD-10-CM | POA: Diagnosis not present

## 2016-03-19 DIAGNOSIS — Z720 Tobacco use: Secondary | ICD-10-CM | POA: Diagnosis not present

## 2016-03-19 HISTORY — DX: Chronic obstructive pulmonary disease, unspecified: J44.9

## 2016-03-19 HISTORY — DX: Vitamin D deficiency, unspecified: E55.9

## 2016-03-19 LAB — COMPREHENSIVE METABOLIC PANEL
ALBUMIN: 4 g/dL (ref 3.5–5.0)
ALK PHOS: 100 U/L (ref 38–126)
ALT: 13 U/L — ABNORMAL LOW (ref 17–63)
ANION GAP: 7 (ref 5–15)
AST: 20 U/L (ref 15–41)
BILIRUBIN TOTAL: 0.5 mg/dL (ref 0.3–1.2)
BUN: 19 mg/dL (ref 6–20)
CALCIUM: 9 mg/dL (ref 8.9–10.3)
CO2: 30 mmol/L (ref 22–32)
Chloride: 102 mmol/L (ref 101–111)
Creatinine, Ser: 1.57 mg/dL — ABNORMAL HIGH (ref 0.61–1.24)
GFR calc non Af Amer: 44 mL/min — ABNORMAL LOW (ref >60)
GFR, EST AFRICAN AMERICAN: 51 mL/min — AB (ref >60)
GLUCOSE: 135 mg/dL — AB (ref 65–99)
POTASSIUM: 3.6 mmol/L (ref 3.5–5.1)
SODIUM: 139 mmol/L (ref 135–145)
TOTAL PROTEIN: 8 g/dL (ref 6.5–8.1)

## 2016-03-19 LAB — CBC WITH DIFFERENTIAL/PLATELET
BASOS PCT: 2 %
Basophils Absolute: 0.1 10*3/uL (ref 0–0.1)
EOS ABS: 0.2 10*3/uL (ref 0–0.7)
Eosinophils Relative: 2 %
HEMATOCRIT: 45.2 % (ref 40.0–52.0)
HEMOGLOBIN: 15.6 g/dL (ref 13.0–18.0)
LYMPHS ABS: 1.2 10*3/uL (ref 1.0–3.6)
Lymphocytes Relative: 17 %
MCH: 32.5 pg (ref 26.0–34.0)
MCHC: 34.5 g/dL (ref 32.0–36.0)
MCV: 94.3 fL (ref 80.0–100.0)
MONO ABS: 0.6 10*3/uL (ref 0.2–1.0)
MONOS PCT: 8 %
Neutro Abs: 4.9 10*3/uL (ref 1.4–6.5)
Neutrophils Relative %: 71 %
Platelets: 187 10*3/uL (ref 150–440)
RBC: 4.8 MIL/uL (ref 4.40–5.90)
RDW: 15 % — AB (ref 11.5–14.5)
WBC: 6.9 10*3/uL (ref 3.8–10.6)

## 2016-03-19 LAB — TROPONIN I: Troponin I: <0.03 ng/mL (ref <0.03)

## 2016-03-19 MED ORDER — PANTOPRAZOLE SODIUM 40 MG PO TBEC
40.0000 mg | DELAYED_RELEASE_TABLET | Freq: Every day | ORAL | Status: DC
  Administered 2016-03-19 – 2016-03-20 (×2): 40 mg via ORAL
  Filled 2016-03-19 (×2): qty 1

## 2016-03-19 MED ORDER — ASPIRIN EC 81 MG PO TBEC
81.0000 mg | DELAYED_RELEASE_TABLET | Freq: Every day | ORAL | Status: DC
  Administered 2016-03-19 – 2016-03-20 (×2): 81 mg via ORAL
  Filled 2016-03-19 (×2): qty 1

## 2016-03-19 MED ORDER — METHYLPREDNISOLONE SODIUM SUCC 125 MG IJ SOLR: 60.0000 mg | INTRAMUSCULAR | Status: DC

## 2016-03-19 MED ORDER — ONDANSETRON HCL 4 MG PO TABS: 4.0000 mg | ORAL_TABLET | Freq: Four times a day (QID) | ORAL | Status: DC | PRN

## 2016-03-19 MED ORDER — IPRATROPIUM-ALBUTEROL 0.5-2.5 (3) MG/3ML IN SOLN
3.0000 mL | Freq: Once | RESPIRATORY_TRACT | Status: AC
  Administered 2016-03-19: 3 mL via RESPIRATORY_TRACT
  Filled 2016-03-19: qty 3

## 2016-03-19 MED ORDER — IPRATROPIUM-ALBUTEROL 0.5-2.5 (3) MG/3ML IN SOLN
3.0000 mL | RESPIRATORY_TRACT | Status: DC
  Administered 2016-03-19 – 2016-03-20 (×5): 3 mL via RESPIRATORY_TRACT
  Filled 2016-03-19 (×5): qty 3

## 2016-03-19 MED ORDER — HYDROCOD POLST-CPM POLST ER 10-8 MG/5ML PO SUER
5.0000 mL | Freq: Once | ORAL | Status: AC
  Administered 2016-03-19: 5 mL via ORAL
  Filled 2016-03-19: qty 5

## 2016-03-19 MED ORDER — ENOXAPARIN SODIUM 40 MG/0.4ML ~~LOC~~ SOLN
40.0000 mg | Freq: Every day | SUBCUTANEOUS | Status: DC
  Filled 2016-03-19: qty 0.4

## 2016-03-19 MED ORDER — ALBUTEROL SULFATE (2.5 MG/3ML) 0.083% IN NEBU: 5.0000 mg | INHALATION_SOLUTION | Freq: Once | RESPIRATORY_TRACT | Status: DC

## 2016-03-19 MED ORDER — SODIUM CHLORIDE 0.9% FLUSH: 3.0000 mL | INTRAVENOUS | Status: DC | PRN

## 2016-03-19 MED ORDER — LEVOFLOXACIN 500 MG PO TABS
500.0000 mg | ORAL_TABLET | Freq: Every day | ORAL | Status: DC
  Administered 2016-03-20: 10:00:00 500 mg via ORAL
  Filled 2016-03-19: qty 1

## 2016-03-19 MED ORDER — LEVOFLOXACIN IN D5W 750 MG/150ML IV SOLN
750.0000 mg | INTRAVENOUS | Status: DC
  Filled 2016-03-19: qty 150

## 2016-03-19 MED ORDER — GABAPENTIN 100 MG PO CAPS
100.0000 mg | ORAL_CAPSULE | Freq: Every day | ORAL | Status: DC
  Administered 2016-03-19: 21:00:00 100 mg via ORAL
  Filled 2016-03-19: qty 1

## 2016-03-19 MED ORDER — ONDANSETRON HCL 4 MG/2ML IJ SOLN: 4.0000 mg | Freq: Four times a day (QID) | INTRAMUSCULAR | Status: DC | PRN

## 2016-03-19 MED ORDER — GUAIFENESIN ER 600 MG PO TB12
600.0000 mg | ORAL_TABLET | Freq: Two times a day (BID) | ORAL | Status: DC | PRN
Start: 2016-03-19 — End: 2016-03-20
  Administered 2016-03-19: 600 mg via ORAL
  Filled 2016-03-19: qty 1

## 2016-03-19 MED ORDER — ATORVASTATIN CALCIUM 20 MG PO TABS
20.0000 mg | ORAL_TABLET | Freq: Every day | ORAL | Status: DC
  Administered 2016-03-19 – 2016-03-20 (×2): 20 mg via ORAL
  Filled 2016-03-19 (×2): qty 1

## 2016-03-19 MED ORDER — VITAMIN D (ERGOCALCIFEROL) 1.25 MG (50000 UNIT) PO CAPS: 50000.0000 [IU] | ORAL_CAPSULE | ORAL | Status: DC

## 2016-03-19 MED ORDER — AMLODIPINE BESYLATE 5 MG PO TABS
5.0000 mg | ORAL_TABLET | Freq: Every day | ORAL | Status: DC
  Administered 2016-03-19 – 2016-03-20 (×2): 5 mg via ORAL
  Filled 2016-03-19 (×2): qty 1

## 2016-03-19 MED ORDER — MOMETASONE FURO-FORMOTEROL FUM 200-5 MCG/ACT IN AERO
2.0000 | INHALATION_SPRAY | Freq: Two times a day (BID) | RESPIRATORY_TRACT | Status: DC
  Administered 2016-03-19 – 2016-03-20 (×3): 2 via RESPIRATORY_TRACT
  Filled 2016-03-19: qty 8.8

## 2016-03-19 MED ORDER — ALBUTEROL SULFATE (2.5 MG/3ML) 0.083% IN NEBU: 2.5000 mg | INHALATION_SOLUTION | RESPIRATORY_TRACT | Status: DC | PRN

## 2016-03-19 MED ORDER — IPRATROPIUM-ALBUTEROL 0.5-2.5 (3) MG/3ML IN SOLN
3.0000 mL | Freq: Four times a day (QID) | RESPIRATORY_TRACT | Status: DC
  Administered 2016-03-19: 08:00:00 3 mL via RESPIRATORY_TRACT
  Filled 2016-03-19: qty 3

## 2016-03-19 MED ORDER — SENNOSIDES-DOCUSATE SODIUM 8.6-50 MG PO TABS
1.0000 | ORAL_TABLET | Freq: Every evening | ORAL | Status: DC | PRN
Start: 2016-03-19 — End: 2016-03-20

## 2016-03-19 MED ORDER — METHYLPREDNISOLONE SODIUM SUCC 125 MG IJ SOLR: 125.0000 mg | Freq: Four times a day (QID) | INTRAMUSCULAR | Status: DC

## 2016-03-19 MED ORDER — TIOTROPIUM BROMIDE MONOHYDRATE 18 MCG IN CAPS
18.0000 ug | ORAL_CAPSULE | Freq: Every day | RESPIRATORY_TRACT | Status: DC
  Administered 2016-03-19 – 2016-03-20 (×2): 18 ug via RESPIRATORY_TRACT
  Filled 2016-03-19: qty 5

## 2016-03-19 MED ORDER — SODIUM CHLORIDE 0.9% FLUSH
3.0000 mL | Freq: Two times a day (BID) | INTRAVENOUS | Status: DC
  Administered 2016-03-19 (×2): 3 mL via INTRAVENOUS

## 2016-03-19 MED ORDER — GABAPENTIN 100 MG PO CAPS: 200.0000 mg | ORAL_CAPSULE | Freq: Every day | ORAL | Status: DC

## 2016-03-19 MED ORDER — LEVOFLOXACIN 750 MG PO TABS
750.0000 mg | ORAL_TABLET | Freq: Once | ORAL | Status: AC
  Administered 2016-03-19: 750 mg via ORAL
  Filled 2016-03-19: qty 1

## 2016-03-19 MED ORDER — SODIUM CHLORIDE 0.9 % IV SOLN
250.0000 mL | INTRAVENOUS | Status: DC | PRN
Start: 2016-03-19 — End: 2016-03-20

## 2016-03-19 MED ORDER — METHYLPREDNISOLONE SODIUM SUCC 125 MG IJ SOLR
125.0000 mg | Freq: Once | INTRAMUSCULAR | Status: AC
  Administered 2016-03-19: 125 mg via INTRAVENOUS
  Filled 2016-03-19: qty 2

## 2016-03-19 MED ORDER — ZOLPIDEM TARTRATE 5 MG PO TABS
5.0000 mg | ORAL_TABLET | Freq: Once | ORAL | Status: AC
  Administered 2016-03-19: 5 mg via ORAL
  Filled 2016-03-19: qty 1

## 2016-03-19 MED ORDER — ACETAMINOPHEN 325 MG PO TABS: 650.0000 mg | ORAL_TABLET | Freq: Four times a day (QID) | ORAL | Status: DC | PRN

## 2016-03-19 NOTE — ED Notes (Signed)
Patient transported to X-ray 

## 2016-03-19 NOTE — ED Notes (Signed)
Pt placed on oxygen at 2lpm via [place]. Pt assisted up to restroom for urination.

## 2016-03-19 NOTE — ED Notes (Signed)
Pt states at home he is between 86-90% on pulse ox. Will continue to monitor. Pt denies increased shob, drinking juice and watching tv in no acute distress.

## 2016-03-19 NOTE — ED Notes (Addendum)
Pt c/o SOB X 2 weeks that has gradually increased in severity. Pt c/o right shoulder/neck pain rated as 3 out of 10 described as spasm X 3 days. Pt reports nonproductive cough. Pt reports hx of COPD. Patient has expiratory wheezes all fields. Pt c/o congested sinuses, and sinus dryness. Pt reports he has been taking afrin with no relief.

## 2016-03-19 NOTE — Progress Notes (Signed)
Pharmacy Antibiotic Note  Kevin Gutierrez is a 65 y.o. male admitted on 03/19/2016 with AECOPD.  Pharmacy has been consulted for levofloxacin dosing.  Plan: Levofloxacin 750 mg IV Q24H  Height: 6' (182.9 cm) Weight: 188 lb (85.3 kg) IBW/kg (Calculated) : 77.6  Temp (24hrs), Avg:97.8 F (36.6 C), Min:97.8 F (36.6 C), Max:97.8 F (36.6 C)   Recent Labs Lab 03/19/16 0049  WBC 6.9  CREATININE 1.57*    Estimated Creatinine Clearance: 50.1 mL/min (by C-G formula based on SCr of 1.57 mg/dL (H)).    No Known Allergies  Thank you for allowing pharmacy to be a part of this patient's care.  Laural Benes, Pharm.D., BCPS Clinical Pharmacist 03/19/2016 5:18 AM

## 2016-03-19 NOTE — ED Triage Notes (Signed)
Pt presents to ED with c/o sob and frequent productive cough. Pt states he has been feeling like this for a couple of weeks but his symptoms have been getting progressively worse. Pt reports the last time he felt this way he had bacterial pneumonia and was admitted to the hospital. Denies any pain. Pt currently has no increased work of breathing or acute distress noted. Talkative and able to answer questions without difficulty.

## 2016-03-19 NOTE — H&P (Signed)
[facility] [place] NAME: Kevin Gutierrez    MR#:  [id number]  DATE OF BIRTH:  [birth year]  DATE OF ADMISSION:  03/19/2016  PRIMARY CARE PHYSICIAN: Coral Spikes, DO   REQUESTING/REFERRING PHYSICIAN:   CHIEF COMPLAINT:   Chief Complaint  Patient presents with  . Cough  . Shortness of Breath    HISTORY OF PRESENT ILLNESS: Kevin Gutierrez  is a 65 y.o. male with a known history of COPD not on home oxygen, hypertension, nephrolithiasis, peptic ulcer disease, vitamin D deficiency presented to the emergency room with increased shortness of breath and cough for the last few days. Patient has been sleeping at home with his windows open in the bedroom at nighttime. He felt very cold and has cough which is dry in nature and has shortness of breath. No complaints of orthopnea or proximal nocturnal dyspnea. Patient was put on oxygen via nasal cannula the emergency room and given breathing treatment. No complaints of any chest pain. No history of headache dizziness or blurry vision. No history of syncope or seizure. No fever or chills. Hospitalist service was consulted for further care of the patient.  PAST MEDICAL HISTORY:   Past Medical History:  Diagnosis Date  . Chicken pox   . COPD (chronic obstructive pulmonary disease) ([place])   . Hypertension   . Kidney stones   . Nephrolithiasis   . Peptic ulcer disease   . Vitamin D deficiency     PAST SURGICAL HISTORY: Past Surgical History:  Procedure Laterality Date  . LITHOTRIPSY    . SPINAL CORD DECOMPRESSION  03/08/1994    SOCIAL HISTORY:  Social History  Substance Use Topics  . Smoking status: Current Every Day Smoker    Packs/day: 0.25    Years: 50.00    Types: Cigarettes  . Smokeless tobacco: Never Used     Comment: Currently smoking 4 cigarettes daily  . Alcohol use No    FAMILY HISTORY:  Family History  Problem Relation Age of Onset  . Diabetes Mellitus II Brother   . Lung cancer  Brother   . Heart disease Brother   . Hyperlipidemia Brother   . Heart disease Mother   . Stroke Father   . Hypertension Brother     DRUG ALLERGIES: No Known Allergies  REVIEW OF SYSTEMS:   CONSTITUTIONAL: No fever, fatigue or weakness.  EYES: No blurred or double vision.  EARS, NOSE, AND THROAT: No tinnitus or ear pain.  RESPIRATORY: Has cough, shortness of breath, wheezing  No hemoptysis.  CARDIOVASCULAR: No chest pain, orthopnea, edema.  GASTROINTESTINAL: No nausea, vomiting, diarrhea or abdominal pain.  GENITOURINARY: No dysuria, hematuria.  ENDOCRINE: No polyuria, nocturia,  HEMATOLOGY: No anemia, easy bruising or bleeding SKIN: No rash or lesion. MUSCULOSKELETAL: No joint pain or arthritis.   NEUROLOGIC: No tingling, numbness, weakness.  PSYCHIATRY: No anxiety or depression.   MEDICATIONS AT HOME:  Prior to Admission medications   Medication Sig Start Date End Date Taking? Authorizing Provider  acetaminophen (TYLENOL) 325 MG tablet Take 650 mg by mouth every 6 (six) hours as needed for moderate pain, fever or headache.    Historical Provider, MD  albuterol (PROVENTIL) (2.5 MG/3ML) 0.083% nebulizer solution Take 3 mLs (2.5 mg total) by nebulization every 3 (three) hours as needed for wheezing or shortness of breath. 05/05/15   Theodoro Grist, MD  amLODipine (NORVASC) 5 MG tablet Take 1 tablet (5 mg total) by mouth daily. 05/24/15   Jayce  G Cook, DO  aspirin EC 81 MG tablet Take 1 tablet (81 mg total) by mouth daily. 05/17/15   Coral Spikes, DO  atorvastatin (LIPITOR) 20 MG tablet Take 1 tablet (20 mg total) by mouth daily. 05/21/15   Coral Spikes, DO  Fluticasone-Salmeterol (ADVAIR DISKUS) 250-50 MCG/DOSE AEPB Inhale 1 puff into the lungs 2 (two) times daily. 05/25/15 05/24/16  Flora Lipps, MD  gabapentin (NEURONTIN) 100 MG capsule Take 2 capsules (200 mg total) by mouth at bedtime. Patient not taking: Reported on 03/15/2016 03/14/16   Lyndal Pulley, DO  nystatin (MYCOSTATIN)  100000 UNIT/ML suspension Take 5 mLs (500,000 Units total) by mouth 4 (four) times daily. 12/15/15   Laverle Hobby, MD  pantoprazole (PROTONIX) 40 MG tablet Take 1 tablet (40 mg total) by mouth daily. 08/16/15   Jayce G Cook, DO  PROVENTIL HFA 108 (90 Base) MCG/ACT inhaler INHALE TWO PUFFS BY MOUTH EVERY 4 HOURS AS NEEDED FOR WHEEZING OR  SHORTNESS  OF  BREATH 01/26/16   Flora Lipps, MD  tiotropium (SPIRIVA) 18 MCG inhalation capsule Place 1 capsule (18 mcg total) into inhaler and inhale daily. 05/25/15   Flora Lipps, MD  Vitamin D, Ergocalciferol, (DRISDOL) 50000 units CAPS capsule Take 1 capsule (50,000 Units total) by mouth every 7 (seven) days. 02/22/16   Coral Spikes, DO      PHYSICAL EXAMINATION:   VITAL SIGNS: Blood pressure (!) 173/79, pulse (!) 103, temperature 97.8 F (36.6 C), temperature source Oral, resp. rate (!) 22, height 6' (1.829 m), weight 85.3 kg (188 lb), SpO2 92 %.  GENERAL:  65 y.o.-year-old patient lying in the bed with no acute distress.  EYES: Pupils equal, round, reactive to light and accommodation. No scleral icterus. Extraocular muscles intact.  HEENT: Head atraumatic, normocephalic. Oropharynx and nasopharynx clear.  NECK:  Supple, no jugular venous distention. No thyroid enlargement, no tenderness.  LUNGS: Decreased breath sounds bilaterally, bilateral wheezing noted, no rales,rhonchi or crepitation. No use of accessory muscles of respiration.  CARDIOVASCULAR: S1, S2 normal. No murmurs, rubs, or gallops.  ABDOMEN: Soft, nontender, nondistended. Bowel sounds present. No organomegaly or mass.  EXTREMITIES: No pedal edema, cyanosis, or clubbing.  NEUROLOGIC: Cranial nerves II through XII are intact. Muscle strength 5/5 in all extremities. Sensation intact. Gait not checked.  PSYCHIATRIC: The patient is alert and oriented x 3.  SKIN: No obvious rash, lesion, or ulcer.   LABORATORY PANEL:   CBC  Recent Labs Lab 03/19/16 0049  WBC 6.9  HGB 15.6  HCT 45.2   PLT 187  MCV 94.3  MCH 32.5  MCHC 34.5  RDW 15.0*  LYMPHSABS 1.2  MONOABS 0.6  EOSABS 0.2  BASOSABS 0.1   ------------------------------------------------------------------------------------------------------------------  Chemistries   Recent Labs Lab 03/19/16 0049  NA 139  K 3.6  CL 102  CO2 30  GLUCOSE 135*  BUN 19  CREATININE 1.57*  CALCIUM 9.0  AST 20  ALT 13*  ALKPHOS 100  BILITOT 0.5   ------------------------------------------------------------------------------------------------------------------ estimated creatinine clearance is 50.1 mL/min (by C-G formula based on SCr of 1.57 mg/dL (H)). ------------------------------------------------------------------------------------------------------------------ No results for input(s): TSH, T4TOTAL, T3FREE, THYROIDAB in the last 72 hours.  Invalid input(s): FREET3   Coagulation profile No results for input(s): INR, PROTIME in the last 168 hours. ------------------------------------------------------------------------------------------------------------------- No results for input(s): DDIMER in the last 72 hours. -------------------------------------------------------------------------------------------------------------------  Cardiac Enzymes  Recent Labs Lab 03/19/16 0049  TROPONINI <0.03   ------------------------------------------------------------------------------------------------------------------ Invalid input(s): POCBNP  ---------------------------------------------------------------------------------------------------------------  Urinalysis  Component Value Date/Time   COLORURINE YELLOW (A) 05/04/2015 1556   APPEARANCEUR Clear 03/15/2016 0853   LABSPEC 1.011 05/04/2015 1556   PHURINE 5.0 05/04/2015 1556   GLUCOSEU Negative 03/15/2016 0853   HGBUR 2+ (A) 05/04/2015 1556   BILIRUBINUR Negative 03/15/2016 0853   KETONESUR NEGATIVE 05/04/2015 1556   PROTEINUR 2+ (A) 03/15/2016 0853    PROTEINUR NEGATIVE 05/04/2015 1556   NITRITE Negative 03/15/2016 0853   NITRITE NEGATIVE 05/04/2015 1556   LEUKOCYTESUR Negative 03/15/2016 0853     RADIOLOGY: Dg Chest 2 View  Result Date: 03/19/2016 CLINICAL DATA:  Shortness of breath and cough since January, worse over the past 2 weeks. Patient was hospitalized in January for pneumonia. History of COPD. Current smoker. EXAM: CHEST  2 VIEW COMPARISON:  CT chest 06/07/2015.  Chest radiograph 06/04/2014 FINDINGS: Heart size and pulmonary vascularity are normal. Emphysematous changes in the lungs with central fibrosis and peribronchial thickening consistent with chronic bronchitis. Linear scarring in the right lung base medially. No focal airspace disease or consolidation. No blunting of costophrenic angles. No pneumothorax. Calcified and tortuous aorta. Pectus excavatum deformity. IMPRESSION: Linear scarring in the right lung base. Emphysematous and chronic bronchitic changes. No evidence of active pulmonary disease. Electronically Signed   By: Lucienne Capers M.D.   On: 03/19/2016 01:34    EKG: Orders placed or performed during the hospital encounter of 03/19/16  . EKG 12-Lead  . EKG 12-Lead  . ED EKG  . ED EKG    IMPRESSION AND PLAN: 65 year old male patient with history of COPD, active smoking, hypertension, nephrolithiasis presented to emergency room with cough and shortness of breath. Admitting diagnosis 1. Acute COPD observation 2. Dyspnea secondary to COPD exacerbation 3. Hypertension 4. Tobacco abuse Treatment plan Admit patient to medical floor Oxygen by nasal cannula 2 L IV Solu-Medrol 125 MG every 6 hourly Nebulization treatment aggressively Start patient on IV Levaquin antibiotic for COPD exacerbation Supportive care.  All the records are reviewed and case discussed with ED provider. Management plans discussed with the patient, family and they are in agreement.  CODE STATUS:FULL CODE Code Status History    Date  Active Date Inactive Code Status Order ID Comments User Context   05/04/2015  3:40 AM 05/05/2015  6:02 PM Full Code [id number]  Saundra Shelling, MD Inpatient       TOTAL TIME TAKING CARE OF THIS PATIENT: 53 minutes.    Saundra Shelling M.D on 03/19/2016 at 5:17 AM  Between 7am to 6pm - Pager - [phone]  After 6pm go to [email or web address] - password [email or web address] [facility]  Office  [phone]  CC: Primary care physician; Coral Spikes, DO

## 2016-03-19 NOTE — Progress Notes (Signed)
Patient refused lovenox.  Said he did not want to be stuck with a needle.  I explained the importance for needing the lovenox.  Pt still refused.  Dr Lavetta Nielsen notified.  Patient said that his usual MD said to only take gabapentin '100mg'$  at bedtime.  This was relayed to Dr Lavetta Nielsen also

## 2016-03-19 NOTE — Progress Notes (Signed)
[facility] at [place] NAME: Kevin Gutierrez    MRN#:  [id number]  DATE OF BIRTH:  10/27/1950  SUBJECTIVE:  Hospital Day: 0 days Kevin Gutierrez is a 65 y.o. male presenting with Cough and Shortness of Breath .   Overnight events: No acute overnight events Interval Events: Still some shortness of breath but improving  REVIEW OF SYSTEMS:  CONSTITUTIONAL: No fever, fatigue or weakness.  EYES: No blurred or double vision.  EARS, NOSE, AND THROAT: No tinnitus or ear pain.  RESPIRATORY: Positive cough, shortness of breath, wheezing denies hemoptysis.  CARDIOVASCULAR: No chest pain, orthopnea, edema.  GASTROINTESTINAL: No nausea, vomiting, diarrhea or abdominal pain.  GENITOURINARY: No dysuria, hematuria.  ENDOCRINE: No polyuria, nocturia,  HEMATOLOGY: No anemia, easy bruising or bleeding SKIN: No rash or lesion. MUSCULOSKELETAL: No joint pain or arthritis.   NEUROLOGIC: No tingling, numbness, weakness.  PSYCHIATRY: No anxiety or depression.   DRUG ALLERGIES:  No Known Allergies  VITALS:  Blood pressure (!) 180/79, pulse (!) 102, temperature 98.2 F (36.8 C), temperature source Oral, resp. rate 20, height 6' (1.829 m), weight 85.3 kg (188 lb), SpO2 94 %.  PHYSICAL EXAMINATION:  VITAL SIGNS: Vitals:   03/19/16 0600 03/19/16 0625  BP:  (!) 180/79  Pulse: 100 (!) 102  Resp: (!) 22 20  Temp:  98.2 F (36.8 C)   GENERAL:65 y.o.male currently in no acute distress.  HEAD: Normocephalic, atraumatic.  EYES: Pupils equal, round, reactive to light. Extraocular muscles intact. No scleral icterus.  MOUTH: Moist mucosal membrane. Dentition intact. No abscess noted.  EAR, NOSE, THROAT: Clear without exudates. No external lesions.  NECK: Supple. No thyromegaly. No nodules. No JVD.  PULMONARY: Diminished breath sounds but without wheeze rails or rhonci. No use of accessory muscles, Good respiratory effort. good air entry bilaterally CHEST:  Nontender to palpation.  CARDIOVASCULAR: S1 and S2. Regular rate and rhythm. No murmurs, rubs, or gallops. No edema. Pedal pulses 2+ bilaterally.  GASTROINTESTINAL: Soft, nontender, nondistended. No masses. Positive bowel sounds. No hepatosplenomegaly.  MUSCULOSKELETAL: No swelling, clubbing, or edema. Range of motion full in all extremities.  NEUROLOGIC: Cranial nerves II through XII are intact. No gross focal neurological deficits. Sensation intact. Reflexes intact.  SKIN: No ulceration, lesions, rashes, or cyanosis. Skin warm and dry. Turgor intact.  PSYCHIATRIC: Mood, affect within normal limits. The patient is awake, alert and oriented x 3. Insight, judgment intact.      LABORATORY PANEL:   CBC  Recent Labs Lab 03/19/16 0049  WBC 6.9  HGB 15.6  HCT 45.2  PLT 187   ------------------------------------------------------------------------------------------------------------------  Chemistries   Recent Labs Lab 03/19/16 0049  NA 139  K 3.6  CL 102  CO2 30  GLUCOSE 135*  BUN 19  CREATININE 1.57*  CALCIUM 9.0  AST 20  ALT 13*  ALKPHOS 100  BILITOT 0.5   ------------------------------------------------------------------------------------------------------------------  Cardiac Enzymes  Recent Labs Lab 03/19/16 0049  TROPONINI <0.03   ------------------------------------------------------------------------------------------------------------------  RADIOLOGY:  Dg Chest 2 View  Result Date: 03/19/2016 CLINICAL DATA:  Shortness of breath and cough since January, worse over the past 2 weeks. Patient was hospitalized in January for pneumonia. History of COPD. Current smoker. EXAM: CHEST  2 VIEW COMPARISON:  CT chest 06/07/2015.  Chest radiograph 06/04/2014 FINDINGS: Heart size and pulmonary vascularity are normal. Emphysematous changes in the lungs with central fibrosis and peribronchial thickening consistent with chronic bronchitis. Linear scarring in the right lung  base medially. No focal  airspace disease or consolidation. No blunting of costophrenic angles. No pneumothorax. Calcified and tortuous aorta. Pectus excavatum deformity. IMPRESSION: Linear scarring in the right lung base. Emphysematous and chronic bronchitic changes. No evidence of active pulmonary disease. Electronically Signed   By: Lucienne Capers M.D.   On: 03/19/2016 01:34    EKG:   Orders placed or performed during the hospital encounter of 03/19/16  . EKG 12-Lead  . EKG 12-Lead  . ED EKG  . ED EKG    ASSESSMENT AND PLAN:   Kevin Gutierrez is a 65 y.o. male presenting with Cough and Shortness of Breath . Admitted 03/19/2016 : Day #: 0 days 1. Acute respiratory failure with hypoxia secondary to COPD exacerbation: Continue supplemental oxygen, decrease steroids, breathing treatments 2. Essential hypertension: Continue home medications 3. Hyperlipidemia unspecified Lipitor  All the records are reviewed and case discussed with Care Management/Social Workerr. Management plans discussed with the patient, family and they are in agreement.  CODE STATUS: full TOTAL TIME TAKING CARE OF THIS PATIENT: 33 minutes.   POSSIBLE D/C IN 1-2DAYS, DEPENDING ON CLINICAL CONDITION.   Kevin Gutierrez,  Karenann Cai.D on 03/19/2016 at 12:22 PM  Between 7am to 6pm - Pager - [phone]  After 6pm: House Pager: - [phone]  [facility]  Office  [phone]  CC: Primary care physician; Coral Spikes, DO

## 2016-03-19 NOTE — ED Notes (Signed)
Pt provided with po fluids and blankets. Pt denies further needs at this time. Pt is able to speak in full sentences.

## 2016-03-19 NOTE — ED Notes (Signed)
Patient reports his O2 sat is normally 87-92%.

## 2016-03-19 NOTE — ED Provider Notes (Signed)
[facility] Emergency Department Provider Note   ____________________________________________   First MD Initiated Contact with Patient 03/19/16 0236     (approximate)  I have reviewed the triage vital signs and the nursing notes.   HISTORY  Chief Complaint Cough and Shortness of Breath    HPI Kevin Gutierrez is a 65 y.o. male who presents to the ED from home with a chief complaint of cough and shortness of breath. Patient has a history of COPD, not on chronic oxygen therapy who has had cough productive of white sputum for the last 2 weeks. Presents secondary to progressive shortness of breath. Symptoms associated with chest tightness. States last time he felt this way he had pneumonia which had been missed for several months and he was admitted to the hospital. Denies associated fever, chills, abdominal pain, nausea, vomiting, diarrhea. Denies recent travel or trauma. Nothing makes his symptoms better or worse.States he has nebulizers at home but he is unclear when and how he should take them so he has not taken any.   Past Medical History:  Diagnosis Date  . Chicken pox   . COPD (chronic obstructive pulmonary disease) ([place])   . Hypertension   . Kidney stones   . Nephrolithiasis   . Peptic ulcer disease   . Vitamin D deficiency     Patient Active Problem List   Diagnosis Date Noted  . DDD (degenerative disc disease), lumbar 03/14/2016  . Pain in both lower legs 02/22/2016  . Vitamin D deficiency 02/22/2016  . Encounter for immunization 02/22/2016  . Hyperlipidemia 11/15/2015  . Basal cell carcinoma 11/15/2015  . CKD (chronic kidney disease) stage 2, GFR 60-89 ml/min 05/19/2015  . Preventative health care 05/17/2015  . COPD (chronic obstructive pulmonary disease) ([place]) 05/17/2015  . History of stomach ulcers 05/17/2015  . History of kidney stones 05/17/2015  . Insomnia 05/17/2015  . Essential hypertension 05/05/2015  . Tobacco abuse 05/05/2015      Past Surgical History:  Procedure Laterality Date  . LITHOTRIPSY    . SPINAL CORD DECOMPRESSION  03/08/1994    Prior to Admission medications   Medication Sig Start Date End Date Taking? Authorizing Provider  acetaminophen (TYLENOL) 325 MG tablet Take 650 mg by mouth every 6 (six) hours as needed for moderate pain, fever or headache.    Historical Provider, MD  albuterol (PROVENTIL) (2.5 MG/3ML) 0.083% nebulizer solution Take 3 mLs (2.5 mg total) by nebulization every 3 (three) hours as needed for wheezing or shortness of breath. 05/05/15   Theodoro Grist, MD  amLODipine (NORVASC) 5 MG tablet Take 1 tablet (5 mg total) by mouth daily. 05/24/15   Coral Spikes, DO  aspirin EC 81 MG tablet Take 1 tablet (81 mg total) by mouth daily. 05/17/15   Coral Spikes, DO  atorvastatin (LIPITOR) 20 MG tablet Take 1 tablet (20 mg total) by mouth daily. 05/21/15   Coral Spikes, DO  Fluticasone-Salmeterol (ADVAIR DISKUS) 250-50 MCG/DOSE AEPB Inhale 1 puff into the lungs 2 (two) times daily. 05/25/15 05/24/16  Flora Lipps, MD  gabapentin (NEURONTIN) 100 MG capsule Take 2 capsules (200 mg total) by mouth at bedtime. Patient not taking: Reported on 03/15/2016 03/14/16   Lyndal Pulley, DO  nystatin (MYCOSTATIN) 100000 UNIT/ML suspension Take 5 mLs (500,000 Units total) by mouth 4 (four) times daily. 12/15/15   Laverle Hobby, MD  pantoprazole (PROTONIX) 40 MG tablet Take 1 tablet (40 mg total) by mouth daily. 08/16/15   Jayce  G Cook, DO  PROVENTIL HFA 108 (90 Base) MCG/ACT inhaler INHALE TWO PUFFS BY MOUTH EVERY 4 HOURS AS NEEDED FOR WHEEZING OR  SHORTNESS  OF  BREATH 01/26/16   Flora Lipps, MD  tiotropium (SPIRIVA) 18 MCG inhalation capsule Place 1 capsule (18 mcg total) into inhaler and inhale daily. 05/25/15   Flora Lipps, MD  Vitamin D, Ergocalciferol, (DRISDOL) 50000 units CAPS capsule Take 1 capsule (50,000 Units total) by mouth every 7 (seven) days. 02/22/16   Coral Spikes, DO    Allergies Patient has no  known allergies.  Family History  Problem Relation Age of Onset  . Diabetes Mellitus II Brother   . Lung cancer Brother   . Heart disease Brother   . Hyperlipidemia Brother   . Heart disease Mother   . Stroke Father   . Hypertension Brother     Social History Social History  Substance Use Topics  . Smoking status: Current Every Day Smoker    Packs/day: 0.25    Years: 50.00    Types: Cigarettes  . Smokeless tobacco: Never Used     Comment: Currently smoking 4 cigarettes daily  . Alcohol use No    Review of Systems  Constitutional: No fever/chills. Eyes: No visual changes. ENT: No sore throat. Cardiovascular: Denies chest pain. Respiratory: Positive for productive cough and shortness of breath. Negative for hemoptysis. Gastrointestinal: No abdominal pain.  No nausea, no vomiting.  No diarrhea.  No constipation. Genitourinary: Negative for dysuria. Musculoskeletal: Negative for back pain. Skin: Negative for rash. Neurological: Negative for headaches, focal weakness or numbness.  10-point ROS otherwise negative.  ____________________________________________   PHYSICAL EXAM:  VITAL SIGNS: ED Triage Vitals  Enc Vitals Group     BP 03/19/16 0039 (!) 186/87     Pulse Rate 03/19/16 0039 92     Resp 03/19/16 0039 20     Temp 03/19/16 0039 97.8 F (36.6 C)     Temp Source 03/19/16 0039 Oral     SpO2 03/19/16 0039 94 %     Weight 03/19/16 0039 188 lb (85.3 kg)     Height 03/19/16 0039 6' (1.829 m)     Head Circumference --      Peak Flow --      Pain Score 03/19/16 0040 0     Pain Loc --      Pain Edu? --      Excl. in [place]? --     Constitutional: Alert and oriented. Well appearing and in no acute distress. Eyes: Conjunctivae are normal. PERRL. EOMI. Head: Atraumatic. Nose: No congestion/rhinnorhea. Mouth/Throat: Mucous membranes are moist.  Oropharynx non-erythematous. Neck: No stridor.   Cardiovascular: Normal rate, regular rhythm. Grossly normal heart  sounds.  Good peripheral circulation. Respiratory: Normal respiratory effort.  No retractions. Lungs with diffuse wheezing. Gastrointestinal: Soft and nontender. No distention. No abdominal bruits. No CVA tenderness. Musculoskeletal: No lower extremity tenderness nor edema.  No joint effusions. Neurologic:  Normal speech and language. No gross focal neurologic deficits are appreciated. No gait instability. Skin:  Skin is warm, dry and intact. No rash noted. Psychiatric: Mood and affect are normal. Speech and behavior are normal.  ____________________________________________   LABS (all labs ordered are listed, but only abnormal results are displayed)  Labs Reviewed  CBC WITH DIFFERENTIAL/PLATELET - Abnormal; Notable for the following:       Result Value   RDW 15.0 (*)    All other components within normal limits  COMPREHENSIVE METABOLIC PANEL - Abnormal;  Notable for the following:    Glucose, Bld 135 (*)    Creatinine, Ser 1.57 (*)    ALT 13 (*)    GFR calc non Af Amer 44 (*)    GFR calc Af Amer 51 (*)    All other components within normal limits  TROPONIN I   ____________________________________________  EKG  ED ECG REPORT I, SUNG,JADE J, the attending physician, personally viewed and interpreted this ECG.   Date: 03/19/2016  EKG Time: 0033  Rate: 96  Rhythm: normal EKG, normal sinus rhythm  Axis: Normal  Intervals:none  ST&T Change: Nonspecific  ____________________________________________  RADIOLOGY  Chest 2 view (viewed by me, interpreted per Dr. Gerilyn Nestle): Linear scarring in the right lung base. Emphysematous and chronic  bronchitic changes. No evidence of active pulmonary disease.   ____________________________________________   PROCEDURES  Procedure(s) performed: None  Procedures  Critical Care performed: No  ____________________________________________   INITIAL IMPRESSION / ASSESSMENT AND PLAN / ED COURSE  Pertinent labs & imaging results that  were available during my care of the patient were reviewed by me and considered in my medical decision making (see chart for details).  65 year old male with COPD who presents with productive cough and wheezing. He was examined after 1 DuoNeb; wheezing persists. Room air saturations 88% which patient states is baseline for him. Reports great improvement in chest tightness. Will administer second DuoNeb, Tussionex, antibiotic at patient's insistence.  Clinical Course as of Mar 19 446  Sun Mar 19, 2016  0444 Room air saturations 82%. Patient is on 2 L nasal cannula. Wheezing improved after second DuoNeb. Will discuss with hospitalist to evaluate patient in the emergency department for admission.  [JS]    Clinical Course User Index [JS] Paulette Blanch, MD     ____________________________________________   FINAL CLINICAL IMPRESSION(S) / ED DIAGNOSES  Final diagnoses:  COPD exacerbation ([place])  Hypoxia  Bronchitis  Shortness of breath      NEW MEDICATIONS STARTED DURING THIS VISIT:  New Prescriptions   No medications on file     Note:  This document was prepared using Dragon voice recognition software and may include unintentional dictation errors.    Paulette Blanch, MD 03/19/16 0700

## 2016-03-19 NOTE — ED Notes (Signed)
Report from [place], rn.

## 2016-03-20 ENCOUNTER — Telehealth: Payer: Self-pay

## 2016-03-20 DIAGNOSIS — Z720 Tobacco use: Secondary | ICD-10-CM | POA: Diagnosis not present

## 2016-03-20 DIAGNOSIS — J441 Chronic obstructive pulmonary disease with (acute) exacerbation: Secondary | ICD-10-CM | POA: Diagnosis not present

## 2016-03-20 DIAGNOSIS — J9621 Acute and chronic respiratory failure with hypoxia: Secondary | ICD-10-CM | POA: Diagnosis not present

## 2016-03-20 DIAGNOSIS — I10 Essential (primary) hypertension: Secondary | ICD-10-CM | POA: Diagnosis not present

## 2016-03-20 LAB — BASIC METABOLIC PANEL
ANION GAP: 5 (ref 5–15)
BUN: 29 mg/dL — ABNORMAL HIGH (ref 6–20)
CO2: 29 mmol/L (ref 22–32)
Calcium: 9 mg/dL (ref 8.9–10.3)
Chloride: 103 mmol/L (ref 101–111)
Creatinine, Ser: 1.47 mg/dL — ABNORMAL HIGH (ref 0.61–1.24)
GFR calc Af Amer: 55 mL/min — ABNORMAL LOW (ref >60)
GFR, EST NON AFRICAN AMERICAN: 48 mL/min — AB (ref >60)
GLUCOSE: 165 mg/dL — AB (ref 65–99)
POTASSIUM: 4.1 mmol/L (ref 3.5–5.1)
Sodium: 137 mmol/L (ref 135–145)

## 2016-03-20 LAB — CBC
HEMATOCRIT: 42.3 % (ref 40.0–52.0)
HEMOGLOBIN: 14.3 g/dL (ref 13.0–18.0)
MCH: 32.2 pg (ref 26.0–34.0)
MCHC: 33.7 g/dL (ref 32.0–36.0)
MCV: 95.3 fL (ref 80.0–100.0)
Platelets: 179 10*3/uL (ref 150–440)
RBC: 4.43 MIL/uL (ref 4.40–5.90)
RDW: 15 % — ABNORMAL HIGH (ref 11.5–14.5)
WBC: 12.1 10*3/uL — ABNORMAL HIGH (ref 3.8–10.6)

## 2016-03-20 MED ORDER — PREDNISONE 10 MG PO TABS: 50.0000 mg | ORAL_TABLET | Freq: Every day | ORAL | 0 refills | Status: DC

## 2016-03-20 MED ORDER — LEVOFLOXACIN 500 MG PO TABS: 500.0000 mg | ORAL_TABLET | Freq: Every day | ORAL | 0 refills | Status: DC

## 2016-03-20 MED ORDER — PREDNISONE 20 MG PO TABS
50.0000 mg | ORAL_TABLET | Freq: Every day | ORAL | Status: DC
  Administered 2016-03-20: 50 mg via ORAL
  Filled 2016-03-20: qty 2

## 2016-03-20 MED ORDER — MOMETASONE FURO-FORMOTEROL FUM 100-5 MCG/ACT IN AERO
2.0000 | INHALATION_SPRAY | Freq: Two times a day (BID) | RESPIRATORY_TRACT | Status: DC
  Administered 2016-03-20: 10:00:00 2 via RESPIRATORY_TRACT
  Filled 2016-03-20: qty 8.8

## 2016-03-20 MED ORDER — MOMETASONE FURO-FORMOTEROL FUM 100-5 MCG/ACT IN AERO: 2.0000 | INHALATION_SPRAY | Freq: Two times a day (BID) | RESPIRATORY_TRACT | 0 refills | Status: DC

## 2016-03-20 MED ORDER — IPRATROPIUM-ALBUTEROL 0.5-2.5 (3) MG/3ML IN SOLN
3.0000 mL | Freq: Three times a day (TID) | RESPIRATORY_TRACT | Status: DC
  Filled 2016-03-20: qty 3

## 2016-03-20 NOTE — Discharge Summary (Signed)
[facility] [place] NAME: Kevin Gutierrez    MR#:  [id number]  DATE OF BIRTH:  10/16/1950  DATE OF ADMISSION:  03/19/2016 ADMITTING PHYSICIAN: Saundra Shelling, MD  DATE OF DISCHARGE: 03/20/16  PRIMARY CARE PHYSICIAN: Coral Spikes, DO    ADMISSION DIAGNOSIS:  Shortness of breath [R06.02] Bronchitis [J40] Hypoxia [R09.02] COPD exacerbation (HCC) [J44.1]  DISCHARGE DIAGNOSIS:  Acute on chronic COPD flare Ongoing tobacco abuse  SECONDARY DIAGNOSIS:   Past Medical History:  Diagnosis Date  . Chicken pox   . COPD (chronic obstructive pulmonary disease) ([place])   . Hypertension   . Kidney stones   . Nephrolithiasis   . Peptic ulcer disease   . Vitamin D deficiency     HOSPITAL COURSE:   Kevin Gutierrez is a 65 y.o. male presenting with Cough and Shortness of Breath . Admitted 03/19/2016 : Day #: 1 days 1. Acute respiratory failure with hypoxia secondary to COPD exacerbation: Continue supplemental oxygen, decrease steroids, breathing treatments -weaned to RA 91% on RA -feels better -no PNA on Cxr  2. Essential hypertension: Continue home medications  3. Hyperlipidemia unspecified Lipitor  4. Tobacco abuse counselled for 4 mins. Pt reports decreased smoking  Overall better d/c home  CONSULTS OBTAINED:    DRUG ALLERGIES:  No Known Allergies  DISCHARGE MEDICATIONS:   Current Discharge Medication List    START taking these medications   Details  levofloxacin (LEVAQUIN) 500 MG tablet Take 1 tablet (500 mg total) by mouth daily. Qty: 3 tablet, Refills: 0    mometasone-formoterol (DULERA) 100-5 MCG/ACT AERO Inhale 2 puffs into the lungs 2 (two) times daily. Qty: 1 Inhaler, Refills: 0    predniSONE (DELTASONE) 10 MG tablet Take 5 tablets (50 mg total) by mouth daily with breakfast. Qty: 15 tablet, Refills: 0      CONTINUE these medications which have NOT CHANGED   Details  gabapentin (NEURONTIN) 100 MG capsule Take  100 mg by mouth at bedtime.    acetaminophen (TYLENOL) 325 MG tablet Take 650 mg by mouth every 6 (six) hours as needed for moderate pain, fever or headache.    albuterol (PROVENTIL) (2.5 MG/3ML) 0.083% nebulizer solution Take 3 mLs (2.5 mg total) by nebulization every 3 (three) hours as needed for wheezing or shortness of breath. Qty: 75 mL, Refills: 12    amLODipine (NORVASC) 5 MG tablet Take 1 tablet (5 mg total) by mouth daily. Qty: 90 tablet, Refills: 3    aspirin EC 81 MG tablet Take 1 tablet (81 mg total) by mouth daily.    atorvastatin (LIPITOR) 20 MG tablet Take 1 tablet (20 mg total) by mouth daily. Qty: 90 tablet, Refills: 3    nystatin (MYCOSTATIN) 100000 UNIT/ML suspension Take 5 mLs (500,000 Units total) by mouth 4 (four) times daily. Qty: 140 mL, Refills: 0    pantoprazole (PROTONIX) 40 MG tablet Take 1 tablet (40 mg total) by mouth daily. Qty: 90 tablet, Refills: 2    PROVENTIL HFA 108 (90 Base) MCG/ACT inhaler INHALE TWO PUFFS BY MOUTH EVERY 4 HOURS AS NEEDED FOR WHEEZING OR  SHORTNESS  OF  BREATH Qty: 7 each, Refills: 2   Associated Diagnoses: Chronic obstructive pulmonary disease, unspecified COPD type (HCC)    tiotropium (SPIRIVA) 18 MCG inhalation capsule Place 1 capsule (18 mcg total) into inhaler and inhale daily. Qty: 30 capsule, Refills: 5    Vitamin D, Ergocalciferol, (DRISDOL) 50000 units CAPS capsule Take 1 capsule (50,000 Units  total) by mouth every 7 (seven) days. Qty: 12 capsule, Refills: 0   Associated Diagnoses: Vitamin D deficiency; Encounter for immunization      STOP taking these medications     Fluticasone-Salmeterol (ADVAIR DISKUS) 250-50 MCG/DOSE AEPB         If you experience worsening of your admission symptoms, develop shortness of breath, life threatening emergency, suicidal or homicidal thoughts you must seek medical attention immediately by calling 911 or calling your MD immediately  if symptoms less severe.  You Must read  complete instructions/literature along with all the possible adverse reactions/side effects for all the Medicines you take and that have been prescribed to you. Take any new Medicines after you have completely understood and accept all the possible adverse reactions/side effects.   Please note  You were cared for by a hospitalist during your hospital stay. If you have any questions about your discharge medications or the care you received while you were in the hospital after you are discharged, you can call the unit and asked to speak with the hospitalist on call if the hospitalist that took care of you is not available. Once you are discharged, your primary care physician will handle any further medical issues. Please note that NO REFILLS for any discharge medications will be authorized once you are discharged, as it is imperative that you return to your primary care physician (or establish a relationship with a primary care physician if you do not have one) for your aftercare needs so that they can reassess your need for medications and monitor your lab values. Today   SUBJECTIVE     VITAL SIGNS:  Blood pressure (!) 155/65, pulse (!) 102, temperature 97.6 F (36.4 C), temperature source Oral, resp. rate 17, height 6' (1.829 m), weight 85.3 kg (188 lb), SpO2 91 %.  I/O:    Intake/Output Summary (Last 24 hours) at 03/20/16 0803 Last data filed at 03/19/16 1416  Gross per 24 hour  Intake              240 ml  Output                0 ml  Net              240 ml    PHYSICAL EXAMINATION:  GENERAL:  65 y.o.-year-old patient lying in the bed with no acute distress.  EYES: Pupils equal, round, reactive to light and accommodation. No scleral icterus. Extraocular muscles intact.  HEENT: Head atraumatic, normocephalic. Oropharynx and nasopharynx clear.  NECK:  Supple, no jugular venous distention. No thyroid enlargement, no tenderness.  LUNGS: Normal breath sounds bilaterally, no wheezing,  rales,rhonchi or crepitation. No use of accessory muscles of respiration.  CARDIOVASCULAR: S1, S2 normal. No murmurs, rubs, or gallops.  ABDOMEN: Soft, non-tender, non-distended. Bowel sounds present. No organomegaly or mass.  EXTREMITIES: No pedal edema, cyanosis, or clubbing.  NEUROLOGIC: Cranial nerves II through XII are intact. Muscle strength 5/5 in all extremities. Sensation intact. Gait not checked.  PSYCHIATRIC: The patient is alert and oriented x 3.  SKIN: No obvious rash, lesion, or ulcer.   DATA REVIEW:   CBC   Recent Labs Lab 03/20/16 0445  WBC 12.1*  HGB 14.3  HCT 42.3  PLT 179    Chemistries   Recent Labs Lab 03/19/16 0049 03/20/16 0445  NA 139 137  K 3.6 4.1  CL 102 103  CO2 30 29  GLUCOSE 135* 165*  BUN 19 29*  CREATININE 1.57*  1.47*  CALCIUM 9.0 9.0  AST 20  --   ALT 13*  --   ALKPHOS 100  --   BILITOT 0.5  --     Microbiology Results   Recent Results (from the past 240 hour(s))  Microscopic Examination     Status: Abnormal   Collection Time: 03/15/16  8:53 AM  Result Value Ref Range Status   WBC, UA 0-5 0 - 5 /hpf Final   RBC, UA 3-10 (A) 0 - 2 /hpf Final   Epithelial Cells (non renal) 0-10 0 - 10 /hpf Final   Bacteria, UA None seen None seen/Few Final    RADIOLOGY:  Dg Chest 2 View  Result Date: 03/19/2016 CLINICAL DATA:  Shortness of breath and cough since January, worse over the past 2 weeks. Patient was hospitalized in January for pneumonia. History of COPD. Current smoker. EXAM: CHEST  2 VIEW COMPARISON:  CT chest 06/07/2015.  Chest radiograph 06/04/2014 FINDINGS: Heart size and pulmonary vascularity are normal. Emphysematous changes in the lungs with central fibrosis and peribronchial thickening consistent with chronic bronchitis. Linear scarring in the right lung base medially. No focal airspace disease or consolidation. No blunting of costophrenic angles. No pneumothorax. Calcified and tortuous aorta. Pectus excavatum deformity.  IMPRESSION: Linear scarring in the right lung base. Emphysematous and chronic bronchitic changes. No evidence of active pulmonary disease. Electronically Signed   By: Lucienne Capers M.D.   On: 03/19/2016 01:34     Management plans discussed with the patient, family and they are in agreement.  CODE STATUS:     Code Status Orders        Start     Ordered   03/19/16 0620  Full code  Continuous     03/19/16 0619    Code Status History    Date Active Date Inactive Code Status Order ID Comments User Context   05/04/2015  3:40 AM 05/05/2015  6:02 PM Full Code [id number]  Saundra Shelling, MD Inpatient      TOTAL TIME TAKING CARE OF THIS PATIENT: 40 minutes.    Giuseppe Duchemin M.D on 03/20/2016 at 8:03 AM  Between 7am to 6pm - Pager - [phone] After 6pm go to [email or web address] - password [email or web address] [facility]  Office  [phone]  CC: Primary care physician; Coral Spikes, DO

## 2016-03-20 NOTE — Telephone Encounter (Signed)
Transition Care Management Follow-up Telephone Call  How have you been since you were released from the hospital? About the same   Do you understand why you were in the hospital? Went himself couldn't breath, not as severe now.    Do you understand the discharge instrcutions? yes  Items Reviewed:  Medications reviewed: added several meds, no questions regarding them.  Allergies reviewed:no allergies  Dietary changes reviewed: no changes Referrals reviewed: just to follow up with Dr. Lacinda Axon and Pulmonary.   Functional Questionnaire:   Activities of Daily Living (ADLs):   He states they are independent in the following: all States they require assistance with the following: none   Any transportation issues/concerns?: no issues   Any patient concerns? Patient is having more acid reflux then prior. Wants to discuss the below also.   Xray of lower spine, leg issues, was sent to sports medicine already, problem areas of pain in mid back and upper back, spinal decompression areas is giving pain out to his shoulders.    Confirmed importance and date/time of follow-up visits scheduled:  December 4th 1130am  Confirmed with patient if condition begins to worsen call PCP or go to the ER.  Patient was given the Call-a-Nurse line [phone]: yes

## 2016-03-20 NOTE — Care Management Important Message (Signed)
Important Message  Patient Details  Name: Kevin Gutierrez MRN: [id number] Date of Birth: [birth year]   Medicare Important Message Given:  Yes    Shelbie Ammons, RN 03/20/2016, 10:14 AM

## 2016-03-20 NOTE — Care Management (Signed)
Admitted to [facility] with the diagnosis of COPD. Lives with wife, [name] [phone]). Last seen Thersa Salt OD 02/21/16 at [place], No home health. No skilled facility. No home oxygen. Uses no aids for ambulation. Lost balance, but didn't fall in October. Good appetite. Takes care of all basic activities of daily living himself, drives. Prescriptions are filled at [facility] on [address]. Wife  Will transport. Discharge to home today per Dr. Nicholes Mango Shelbie Ammons RN MSN CCM Care Management

## 2016-03-20 NOTE — Progress Notes (Signed)
Patient discharged home per MD order. All discharge instructions given and all questions answered. 

## 2016-03-21 ENCOUNTER — Ambulatory Visit: Payer: PPO | Attending: Family Medicine

## 2016-03-23 ENCOUNTER — Ambulatory Visit: Payer: PPO

## 2016-03-27 ENCOUNTER — Encounter: Payer: Self-pay | Admitting: Family Medicine

## 2016-03-27 ENCOUNTER — Ambulatory Visit (INDEPENDENT_AMBULATORY_CARE_PROVIDER_SITE_OTHER): Payer: PPO | Admitting: Family Medicine

## 2016-03-27 DIAGNOSIS — J441 Chronic obstructive pulmonary disease with (acute) exacerbation: Secondary | ICD-10-CM | POA: Diagnosis not present

## 2016-03-27 DIAGNOSIS — B379 Candidiasis, unspecified: Secondary | ICD-10-CM | POA: Diagnosis not present

## 2016-03-27 MED ORDER — LEVOFLOXACIN 500 MG PO TABS: 500.0000 mg | ORAL_TABLET | Freq: Every day | ORAL | 0 refills | Status: DC

## 2016-03-27 MED ORDER — FLUCONAZOLE 150 MG PO TABS: 150.0000 mg | ORAL_TABLET | Freq: Once | ORAL | 0 refills | Status: AC

## 2016-03-27 MED ORDER — FLUTICASONE FUROATE-VILANTEROL 200-25 MCG/INH IN AEPB: 1.0000 | INHALATION_SPRAY | Freq: Every day | RESPIRATORY_TRACT | 0 refills | Status: DC

## 2016-03-27 MED ORDER — PREDNISONE 10 MG PO TABS: ORAL_TABLET | ORAL | 0 refills | Status: DC

## 2016-03-27 NOTE — Progress Notes (Signed)
Subjective:  Patient ID: Kevin Gutierrez, male    DOB: Sep 13, 1950  Age: 65 y.o. MRN: [id number]  CC: Hospital follow up  HPI:  65 year old male with tobacco abuse, COPD presents for hospital follow-up.  Patient was recently admitted from 11/26-11/27.  Hospital course reviewed and summarized as follows: He presented with cough and shortness of breath. He was found to have acute respiratory failure with hypoxia secondary to COPD exacerbation. He was placed on oxygen and was treated with steroids, breathing treatments, antibiotic. He was weaned down to room air and was discharged home in reported stable condition. He was discharged on Levaquin, prednisone, and his Advair was switched to [facility]. He was only given a three-day supply of prednisone and Levaquin.    Medications were reviewed and reconciled today.  Patient states that since his hospitalization he has not been doing well. He's continued to have cough is productive and associated shortness of breath. He has finished his antibiotic course as well as his prednisone. He was not able to get the dulera as it is not on formulary and required a prior authorization. No associated fever. Patient also notes that he feels like he has thrush. No other complaints or concerns at this time.  Social Hx   Social History   Social History  . Marital status: Married    Spouse name: N/A  . Number of children: N/A  . Years of education: N/A   Occupational History  . retired    Social History Main Topics  . Smoking status: Current Every Day Smoker    Packs/day: 0.25    Years: 50.00    Types: Cigarettes  . Smokeless tobacco: Never Used     Comment: Currently smoking 4 cigarettes daily  . Alcohol use No  . Drug use:     Types: Marijuana     Comment: Occasional marijuana use  . Sexual activity: Not Asked   Other Topics Concern  . None   Social History Narrative   Lives with family   Review of Systems  Constitutional: Negative for fever.    Respiratory: Positive for cough and shortness of breath.    Objective:  BP 118/84 (BP Location: Left Arm, Patient Position: Sitting, Cuff Size: Normal)   Pulse 96   Temp 98.1 F (36.7 C) (Oral)   Wt 191 lb 8 oz (86.9 kg)   SpO2 (!) 86%   BMI 25.97 kg/m   BP/Weight 03/27/2016 03/20/2016 35/36/1443  Systolic BP 154 008 -  Diastolic BP 84 65 -  Wt. (Lbs) 191.5 - 188  BMI 25.97 - 25.5   Physical Exam  Constitutional: He is oriented to person, place, and time. He appears well-developed. No distress.  HENT:  Mild redness noted in the mouth. No discrete area of thrush noted.   Cardiovascular: Normal rate and regular rhythm.   Pulmonary/Chest: Effort normal.  Diffuse wheezing noted.  Neurological: He is alert and oriented to person, place, and time.  Psychiatric: He has a normal mood and affect.  Vitals reviewed.  Lab Results  Component Value Date   WBC 12.1 (H) 03/20/2016   HGB 14.3 03/20/2016   HCT 42.3 03/20/2016   PLT 179 03/20/2016   GLUCOSE 165 (H) 03/20/2016   CHOL 99 11/15/2015   TRIG 48.0 11/15/2015   HDL 37.40 (L) 11/15/2015   LDLCALC 52 11/15/2015   ALT 13 (L) 03/19/2016   AST 20 03/19/2016   NA 137 03/20/2016   K 4.1 03/20/2016   CL 103 03/20/2016  CREATININE 1.47 (H) 03/20/2016   BUN 29 (H) 03/20/2016   CO2 29 03/20/2016   PSA 0.71 05/17/2015   INR 0.99 05/04/2015   HGBA1C 5.8 05/04/2015    Assessment & Plan:   Problem List Items Addressed This Visit    COPD with acute exacerbation ([place])    New problem. Medications reviewed and reconciled today. Patient not doing well following hospitalization. Was discharged quickly, perhaps too quickly. Re-treating with prednisone, Levaquin given persistent symptoms. Oxygen saturation is low particularly with exertion (ranges from 86-91). Stopping Dulera. Trial of Breo. Sample given today.      Relevant Medications   predniSONE (DELTASONE) 10 MG tablet   fluticasone furoate-vilanterol (BREO ELLIPTA) 200-25  MCG/INH AEPB   Candidiasis    Presumed based on reports of oral pain/irritation and use of inhalers. Has tried nystatin without significant improvement. Starting on Diflucan.      Relevant Medications   fluconazole (DIFLUCAN) 150 MG tablet      Meds ordered this encounter  Medications  . fluconazole (DIFLUCAN) 150 MG tablet    Sig: Take 1 tablet (150 mg total) by mouth once.    Dispense:  1 tablet    Refill:  0  . predniSONE (DELTASONE) 10 MG tablet    Sig: 50 mg daily x 3 days, then 40 mg  daily x 3 days, then 30 mg daily x 3 days, then 20 mg daily x 3 days, then 10 mg daily x 3 days.    Dispense:  45 tablet    Refill:  0  . levofloxacin (LEVAQUIN) 500 MG tablet    Sig: Take 1 tablet (500 mg total) by mouth daily.    Dispense:  7 tablet    Refill:  0  . fluticasone furoate-vilanterol (BREO ELLIPTA) 200-25 MCG/INH AEPB    Sig: Inhale 1 puff into the lungs daily.    Dispense:  1 each    Refill:  0    Follow-up: 1 month.  [place]

## 2016-03-27 NOTE — Progress Notes (Signed)
Pre visit review using our clinic review tool, if applicable. No additional management support is needed unless otherwise documented below in the visit note. 

## 2016-03-27 NOTE — Assessment & Plan Note (Addendum)
New problem. Medications reviewed and reconciled today. Patient not doing well following hospitalization. Was discharged quickly, perhaps too quickly. Re-treating with prednisone, Levaquin given persistent symptoms. Oxygen saturation is low particularly with exertion (ranges from 86-91). Stopping Dulera. Trial of Breo. Sample given today.

## 2016-03-27 NOTE — Patient Instructions (Addendum)
Call with concerns.  Take meds as prescribed.  Follow up in 1 month.   Take care  Dr. Lacinda Axon

## 2016-03-27 NOTE — Assessment & Plan Note (Signed)
Presumed based on reports of oral pain/irritation and use of inhalers. Has tried nystatin without significant improvement. Starting on Diflucan.

## 2016-03-30 ENCOUNTER — Encounter: Payer: Self-pay | Admitting: Internal Medicine

## 2016-03-30 ENCOUNTER — Ambulatory Visit (INDEPENDENT_AMBULATORY_CARE_PROVIDER_SITE_OTHER): Payer: PPO | Admitting: Internal Medicine

## 2016-03-30 VITALS — BP 148/62 | HR 117 | Wt 192.0 lb

## 2016-03-30 DIAGNOSIS — J9611 Chronic respiratory failure with hypoxia: Secondary | ICD-10-CM

## 2016-03-30 MED ORDER — FLUTICASONE FUROATE-VILANTEROL 200-25 MCG/INH IN AEPB: 1.0000 | INHALATION_SPRAY | Freq: Every day | RESPIRATORY_TRACT | 0 refills | Status: AC

## 2016-03-30 MED ORDER — FLUTICASONE FUROATE-VILANTEROL 200-25 MCG/INH IN AEPB
1.0000 | INHALATION_SPRAY | Freq: Every day | RESPIRATORY_TRACT | 0 refills | Status: AC
Start: 2016-03-30 — End: 2016-03-31

## 2016-03-30 NOTE — Addendum Note (Signed)
Addended by: Oscar La R on: 03/30/2016 12:20 PM   Modules accepted: Orders

## 2016-03-30 NOTE — Patient Instructions (Signed)
Continue BREO 200 Continue spiriva Albuterol as needed Finish up steroids and levaquin  SMoking cessation strongly advised

## 2016-03-30 NOTE — Addendum Note (Signed)
Addended by: Maryanna Shape A on: 03/30/2016 03:48 PM   Modules accepted: Orders

## 2016-03-30 NOTE — Addendum Note (Signed)
Addended by: Maryanna Shape A on: 03/30/2016 11:41 AM   Modules accepted: Orders

## 2016-03-30 NOTE — Progress Notes (Signed)
[facility] Consultation     Date: 03/30/2016,   MRN# [id number] Kevin Gutierrez 1951/01/19 Code Status:  Code Status History    Date Active Date Inactive Code Status Order ID Comments User Context   05/04/2015  3:40 AM 05/05/2015  6:02 PM Full Code [id number]  Saundra Shelling, MD Inpatient     Hosp day:'@LENGTHOFSTAYDAYS'$ @ Referring MD: '@ATDPROV'$ @     PCP:      AdmissionWeight: 192 lb (87.1 kg)                 CurrentWeight: 192 lb (87.1 kg) Kevin Gutierrez is a 65 y.o. old male seen in consultation for hsopital follow up for hemoptysis.   CHIEF COMPLAINT:   Follow up coughing and wheezing   HISTORY OF PRESENT ILLNESS   Patient breathing comfortably, but has wheezing no acute issues, still smokes Recent hosp amdission for COPD exacerbation  No fevers chills at this time No signs of infection at this time  Patient refuses to wear oxygen at night time due to cost, but he really needs it   CT chest shows emphysematous changes and Bronchial wall thickening   Repeat CT chows improvement of pneumonia, residual mucus still present  Doing well with inhaler therapy  PFT 05/2015 Ratio 60%  FEv1 59% predicted RD 118% DLCo 39%  Office SPIRO 01/18/16 Ratio60% FEv1 1.73 L 47%   Fev1 was 59% and reduced down to 47% currently   Current Medication:   Current Outpatient Prescriptions:  .  acetaminophen (TYLENOL) 325 MG tablet, Take 500 mg by mouth every 6 (six) hours as needed for moderate pain, fever or headache. , Disp: , Rfl:  .  albuterol (PROVENTIL) (2.5 MG/3ML) 0.083% nebulizer solution, Take 3 mLs (2.5 mg total) by nebulization every 3 (three) hours as needed for wheezing or shortness of breath., Disp: 75 mL, Rfl: 12 .  amLODipine (NORVASC) 5 MG tablet, Take 1 tablet (5 mg total) by mouth daily., Disp: 90 tablet, Rfl: 3 .  aspirin EC 81 MG tablet, Take 1 tablet (81 mg total) by mouth daily., Disp: , Rfl:  .  atorvastatin (LIPITOR) 20 MG tablet, Take 1  tablet (20 mg total) by mouth daily., Disp: 90 tablet, Rfl: 3 .  fluticasone furoate-vilanterol (BREO ELLIPTA) 200-25 MCG/INH AEPB, Inhale 1 puff into the lungs daily., Disp: 1 each, Rfl: 0 .  gabapentin (NEURONTIN) 100 MG capsule, Take 100 mg by mouth at bedtime., Disp: , Rfl:  .  levofloxacin (LEVAQUIN) 500 MG tablet, Take 1 tablet (500 mg total) by mouth daily., Disp: 7 tablet, Rfl: 0 .  nystatin (MYCOSTATIN) 100000 UNIT/ML suspension, Take 5 mLs (500,000 Units total) by mouth 4 (four) times daily., Disp: 140 mL, Rfl: 0 .  pantoprazole (PROTONIX) 40 MG tablet, Take 1 tablet (40 mg total) by mouth daily., Disp: 90 tablet, Rfl: 2 .  predniSONE (DELTASONE) 10 MG tablet, 50 mg daily x 3 days, then 40 mg  daily x 3 days, then 30 mg daily x 3 days, then 20 mg daily x 3 days, then 10 mg daily x 3 days., Disp: 45 tablet, Rfl: 0 .  PROVENTIL HFA 108 (90 Base) MCG/ACT inhaler, INHALE TWO PUFFS BY MOUTH EVERY 4 HOURS AS NEEDED FOR WHEEZING OR  SHORTNESS  OF  BREATH, Disp: 7 each, Rfl: 2 .  tiotropium (SPIRIVA) 18 MCG inhalation capsule, Place 1 capsule (18 mcg total) into inhaler and inhale daily., Disp: 30 capsule, Rfl: 5 .  Vitamin D, Ergocalciferol, (DRISDOL)  50000 units CAPS capsule, Take 1 capsule (50,000 Units total) by mouth every 7 (seven) days. (Patient taking differently: Take 50,000 Units by mouth every 7 (seven) days. wednesdays), Disp: 12 capsule, Rfl: 0     ALLERGIES   Patient has no known allergies.     REVIEW OF SYSTEMS   Review of Systems  Constitutional: Negative for chills, fever, malaise/fatigue and weight loss.  HENT: Negative for congestion.   Respiratory: Positive for cough, shortness of breath and wheezing. Negative for hemoptysis and sputum production.   Cardiovascular: Negative for chest pain and leg swelling.  Gastrointestinal: Negative for heartburn.  Neurological: Negative for dizziness.  All other systems reviewed and are negative.    VS: BP (!) 148/62 (BP  Location: Left Arm, Cuff Size: Normal)   Pulse (!) 117   Wt 192 lb (87.1 kg)   SpO2 94%   BMI 26.04 kg/m      PHYSICAL EXAM   Physical Exam  Constitutional: He is oriented to person, place, and time. No distress.  Cardiovascular: Normal rate, regular rhythm and normal heart sounds.   No murmur heard. Pulmonary/Chest: Effort normal. No stridor. No respiratory distress. He has no wheezes. He has no rales.  Musculoskeletal: Normal range of motion. He exhibits no edema.  Neurological: He is alert and oriented to person, place, and time.  Psychiatric: He has a normal mood and affect.         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 Repeat ONO needed  I anticipate recurrent bouts of exacerbations in the future due to his underlying disease and continues to smoke  Follow up in 3 months with Office SPirometry and Ambulatory Pulse Oximetry  The Patient requires high complexity decision making for assessment and support, frequent evaluation and titration of therapies, application of advanced monitoring technologies and extensive interpretation of multiple databases.   Patient satisfied with Plan of action and management. All questions answered   Corrin Parker, M.D.  [facility]  Medical Director [place] Director [facility] Cardio-Pulmonary Department

## 2016-03-31 DIAGNOSIS — J449 Chronic obstructive pulmonary disease, unspecified: Secondary | ICD-10-CM | POA: Diagnosis not present

## 2016-03-31 DIAGNOSIS — R0602 Shortness of breath: Secondary | ICD-10-CM | POA: Diagnosis not present

## 2016-03-31 DIAGNOSIS — J9611 Chronic respiratory failure with hypoxia: Secondary | ICD-10-CM | POA: Diagnosis not present

## 2016-04-05 ENCOUNTER — Ambulatory Visit (INDEPENDENT_AMBULATORY_CARE_PROVIDER_SITE_OTHER): Payer: PPO | Admitting: Family

## 2016-04-05 ENCOUNTER — Encounter: Payer: Self-pay | Admitting: Family Medicine

## 2016-04-05 ENCOUNTER — Encounter: Payer: Self-pay | Admitting: Family

## 2016-04-05 ENCOUNTER — Telehealth: Payer: Self-pay | Admitting: Family Medicine

## 2016-04-05 VITALS — BP 140/80 | HR 100 | Temp 97.6°F | Ht 72.0 in | Wt 194.2 lb

## 2016-04-05 DIAGNOSIS — I10 Essential (primary) hypertension: Secondary | ICD-10-CM | POA: Diagnosis not present

## 2016-04-05 DIAGNOSIS — J439 Emphysema, unspecified: Secondary | ICD-10-CM

## 2016-04-05 DIAGNOSIS — F411 Generalized anxiety disorder: Secondary | ICD-10-CM

## 2016-04-05 NOTE — Assessment & Plan Note (Signed)
New. From prednisone. Advised quicker taper of prednisone taper. F/u if anxiety, sleep does not improve moving to lowest dose of prednisone.

## 2016-04-05 NOTE — Telephone Encounter (Signed)
Pt called with information about pt BP 134/68 and Oxygen is 88. Thank you!

## 2016-04-05 NOTE — Assessment & Plan Note (Addendum)
Elevated from prednisone. Advised patient to start '20mg'$  prednisone tomorrow for one day and then move to '10mg'$  for last 3 days to mitigate AE from prednisone. No signs or symptoms of hypertensive urgency or emergency at this time. Politely declined EKG.  There is a discrepancy in blood pressure between the right and left arm. Patient is following with Dr. Lucky Cowboy vascular. Will call with BP from home today.

## 2016-04-05 NOTE — Progress Notes (Signed)
Subjective:    Patient ID: Kevin Gutierrez, male    DOB: 05-Oct-1950, 65 y.o.   MRN: [id number]  CC: Kevin Gutierrez is a 65 y.o. male who presents today for an acute visit.    HPI: CC: anxiety for last week on prednisone.   States elevated in BP is 'not unusual for me' when in doctors office. 'Wound tight as rubberband.' Cannot sleep on prednisone and irritable as a 'hornet'.   At home BP runs 115/80 at home and 'sometimes pretty low.'  Recent hospitalization and started on 15 day prednisone taper for COPD. 6 days left of taper.   Denies exertional chest pain or pressure, numbness or tingling radiating to left arm or jaw, palpitations, dizziness, frequent headaches, changes in vision, or shortness of breath.    Current smoker.     HISTORY:  Past Medical History:  Diagnosis Date  . Chicken pox   . COPD (chronic obstructive pulmonary disease) ([place])   . Hypertension   . Kidney stones   . Nephrolithiasis   . Peptic ulcer disease   . Vitamin D deficiency    Past Surgical History:  Procedure Laterality Date  . LITHOTRIPSY    . SPINAL CORD DECOMPRESSION  03/08/1994   Family History  Problem Relation Age of Onset  . Diabetes Mellitus II Brother   . Lung cancer Brother   . Heart disease Brother   . Hyperlipidemia Brother   . Heart disease Mother   . Stroke Father   . Hypertension Brother     Allergies: Patient has no known allergies. Current Outpatient Prescriptions on File Prior to Visit  Medication Sig Dispense Refill  . acetaminophen (TYLENOL) 325 MG tablet Take 500 mg by mouth every 6 (six) hours as needed for moderate pain, fever or headache.     . albuterol (PROVENTIL) (2.5 MG/3ML) 0.083% nebulizer solution Take 3 mLs (2.5 mg total) by nebulization every 3 (three) hours as needed for wheezing or shortness of breath. 75 mL 12  . amLODipine (NORVASC) 5 MG tablet Take 1 tablet (5 mg total) by mouth daily. 90 tablet 3  . aspirin EC 81 MG tablet Take 1 tablet (81 mg total)  by mouth daily.    Marland Kitchen atorvastatin (LIPITOR) 20 MG tablet Take 1 tablet (20 mg total) by mouth daily. 90 tablet 3  . fluticasone furoate-vilanterol (BREO ELLIPTA) 200-25 MCG/INH AEPB Inhale 1 puff into the lungs daily. 1 each 0  . gabapentin (NEURONTIN) 100 MG capsule Take 100 mg by mouth at bedtime.    Marland Kitchen nystatin (MYCOSTATIN) 100000 UNIT/ML suspension Take 5 mLs (500,000 Units total) by mouth 4 (four) times daily. 140 mL 0  . pantoprazole (PROTONIX) 40 MG tablet Take 1 tablet (40 mg total) by mouth daily. 90 tablet 2  . predniSONE (DELTASONE) 10 MG tablet 50 mg daily x 3 days, then 40 mg  daily x 3 days, then 30 mg daily x 3 days, then 20 mg daily x 3 days, then 10 mg daily x 3 days. 45 tablet 0  . PROVENTIL HFA 108 (90 Base) MCG/ACT inhaler INHALE TWO PUFFS BY MOUTH EVERY 4 HOURS AS NEEDED FOR WHEEZING OR  SHORTNESS  OF  BREATH 7 each 2  . tiotropium (SPIRIVA) 18 MCG inhalation capsule Place 1 capsule (18 mcg total) into inhaler and inhale daily. 30 capsule 5  . Vitamin D, Ergocalciferol, (DRISDOL) 50000 units CAPS capsule Take 1 capsule (50,000 Units total) by mouth every 7 (seven) days. (Patient taking differently:  Take 50,000 Units by mouth every 7 (seven) days. wednesdays) 12 capsule 0   No current facility-administered medications on file prior to visit.     Social History  Substance Use Topics  . Smoking status: Current Every Day Smoker    Packs/day: 0.50    Years: 50.00    Types: Cigarettes  . Smokeless tobacco: Never Used     Comment: Currently smoking 4 cigarettes daily  . Alcohol use No    Review of Systems  Constitutional: Negative for chills and fever.  HENT: Negative for congestion, ear pain, rhinorrhea, sinus pressure and sore throat.   Respiratory: Negative for cough, shortness of breath and wheezing.   Cardiovascular: Negative for chest pain and palpitations.  Gastrointestinal: Negative for diarrhea, nausea and vomiting.  Genitourinary: Negative for dysuria.    Musculoskeletal: Negative for myalgias.  Skin: Negative for rash.  Neurological: Negative for headaches.  Hematological: Negative for adenopathy.      Objective:    BP 140/80 Comment: left arm  Pulse 100   Temp 97.6 F (36.4 C) (Oral)   Ht 6' (1.829 m)   Wt 194 lb 3.2 oz (88.1 kg)   SpO2 91%   BMI 26.34 kg/m  BP Readings from Last 3 Encounters:  04/05/16 140/80  03/30/16 (!) 148/62  03/27/16 118/84     Physical Exam  Constitutional: He appears well-developed and well-nourished.  HENT:  Right Ear: Hearing normal.  Left Ear: Hearing normal.  Mouth/Throat: Uvula is midline, oropharynx is clear and moist and mucous membranes are normal. No posterior oropharyngeal edema or posterior oropharyngeal erythema.  Eyes: Conjunctivae, EOM and lids are normal. Pupils are equal, round, and reactive to light. Lids are everted and swept, no foreign bodies found.  Normal fundus bilaterally.  Cardiovascular: Regular rhythm and normal heart sounds.   Pulmonary/Chest: Effort normal and breath sounds normal. No respiratory distress. He has no wheezes. He has no rhonchi. He has no rales.  Lymphadenopathy:       Head (right side): No submental, no submandibular, no tonsillar, no preauricular, no posterior auricular and no occipital adenopathy present.       Head (left side): No submental, no submandibular, no tonsillar, no preauricular, no posterior auricular and no occipital adenopathy present.    He has no cervical adenopathy.  Neurological: He is alert. He has normal strength. No cranial nerve deficit or sensory deficit. He displays a negative Romberg sign.  Reflex Scores:      Bicep reflexes are 2+ on the right side and 2+ on the left side.      Patellar reflexes are 2+ on the right side and 2+ on the left side. Grip equal and strong bilateral upper extremities. Gait strong and steady. Able to perform rapid alternating movement without difficulty.  Skin: Skin is warm and dry.  Psychiatric:  He has a normal mood and affect. His speech is normal and behavior is normal.  Vitals reviewed.      Assessment & Plan:   Problem List Items Addressed This Visit      Cardiovascular and Mediastinum   Essential hypertension    Elevated from prednisone. Advised patient to start '20mg'$  prednisone tomorrow for one day and then move to '10mg'$  for last 3 days to mitigate AE from prednisone. No signs or symptoms of hypertensive urgency or emergency at this time. Politely declined EKG.  There is a discrepancy in blood pressure between the right and left arm. Patient is following with Dr. Lucky Cowboy vascular. Will call  with BP from home today.        Respiratory   COPD (chronic obstructive pulmonary disease) ([facility])    Stable. Sao2 at baseline. Improving on prednisone. Started on O2 by pulmonology last week.         Other   Anxiety state - Primary    New. From prednisone. Advised quicker taper of prednisone taper. F/u if anxiety, sleep does not improve moving to lowest dose of prednisone.              I have discontinued Mr. Simenson levofloxacin. I am also having him maintain his acetaminophen, albuterol, aspirin EC, atorvastatin, amLODipine, tiotropium, pantoprazole, nystatin, PROVENTIL HFA, Vitamin D (Ergocalciferol), gabapentin, predniSONE, and fluticasone furoate-vilanterol.   No orders of the defined types were placed in this encounter.   Return precautions given.   Risks, benefits, and alternatives of the medications and treatment plan prescribed today were discussed, and patient expressed understanding.   Education regarding symptom management and diagnosis given to patient on AVS.  Continue to follow with Coral Spikes, DO for routine health maintenance.   Kevin Gutierrez and I agreed with plan.   Mable Paris, FNP

## 2016-04-05 NOTE — Telephone Encounter (Signed)
Spoke with pt- O2 stays between 82-92% at home. In office had been 91%.  No increased SOB.  Will let us know if sao2 drops or becomes more SOB

## 2016-04-05 NOTE — Progress Notes (Signed)
Pre visit review using our clinic review tool, if applicable. No additional management support is needed unless otherwise documented below in the visit note. 

## 2016-04-05 NOTE — Telephone Encounter (Signed)
Please advise 

## 2016-04-05 NOTE — Assessment & Plan Note (Signed)
Stable. Sao2 at baseline. Improving on prednisone. Started on O2 by pulmonology last week.

## 2016-04-05 NOTE — Patient Instructions (Signed)
Take '20mg'$  prednisone tomorrow x ONE day Following day move to '10mg'$  prednisone for last 3 days and then stop. Disgard remaining medication  Stay very vigilant with stroke and heart attack symptoms as discussed.   Call with BP when you get home

## 2016-04-06 ENCOUNTER — Other Ambulatory Visit: Payer: Self-pay | Admitting: Family Medicine

## 2016-04-06 ENCOUNTER — Encounter: Payer: Self-pay | Admitting: Family Medicine

## 2016-04-06 MED ORDER — FLUCONAZOLE 100 MG PO TABS: 100.0000 mg | ORAL_TABLET | Freq: Every day | ORAL | 0 refills | Status: DC

## 2016-04-09 ENCOUNTER — Encounter: Payer: Self-pay | Admitting: Family Medicine

## 2016-04-11 ENCOUNTER — Ambulatory Visit: Payer: PPO | Admitting: Family Medicine

## 2016-04-26 ENCOUNTER — Encounter: Payer: Self-pay | Admitting: Internal Medicine

## 2016-04-26 NOTE — Telephone Encounter (Signed)
DK please advise on RX for nicotine patches.

## 2016-04-27 ENCOUNTER — Encounter: Payer: Self-pay | Admitting: Family Medicine

## 2016-04-27 ENCOUNTER — Ambulatory Visit: Payer: PPO | Admitting: Family Medicine

## 2016-05-01 ENCOUNTER — Other Ambulatory Visit: Payer: Self-pay | Admitting: Internal Medicine

## 2016-05-01 ENCOUNTER — Other Ambulatory Visit: Payer: Self-pay | Admitting: *Deleted

## 2016-05-01 DIAGNOSIS — R0602 Shortness of breath: Secondary | ICD-10-CM | POA: Diagnosis not present

## 2016-05-01 DIAGNOSIS — J9611 Chronic respiratory failure with hypoxia: Secondary | ICD-10-CM | POA: Diagnosis not present

## 2016-05-01 DIAGNOSIS — J449 Chronic obstructive pulmonary disease, unspecified: Secondary | ICD-10-CM | POA: Diagnosis not present

## 2016-05-01 MED ORDER — NICOTINE 21 MG/24HR TD PT24
21.0000 mg | MEDICATED_PATCH | Freq: Every day | TRANSDERMAL | 0 refills | Status: DC
Start: 2016-05-01 — End: 2016-07-03

## 2016-05-15 ENCOUNTER — Encounter: Payer: Self-pay | Admitting: Family Medicine

## 2016-05-22 ENCOUNTER — Ambulatory Visit: Payer: PPO | Admitting: Family Medicine

## 2016-05-29 ENCOUNTER — Other Ambulatory Visit: Payer: Self-pay | Admitting: Family Medicine

## 2016-05-31 ENCOUNTER — Other Ambulatory Visit: Payer: Self-pay | Admitting: Family Medicine

## 2016-06-01 DIAGNOSIS — J449 Chronic obstructive pulmonary disease, unspecified: Secondary | ICD-10-CM | POA: Diagnosis not present

## 2016-06-01 DIAGNOSIS — J9611 Chronic respiratory failure with hypoxia: Secondary | ICD-10-CM | POA: Diagnosis not present

## 2016-06-01 DIAGNOSIS — R0602 Shortness of breath: Secondary | ICD-10-CM | POA: Diagnosis not present

## 2016-06-13 ENCOUNTER — Encounter: Payer: Self-pay | Admitting: Family Medicine

## 2016-06-13 ENCOUNTER — Ambulatory Visit (INDEPENDENT_AMBULATORY_CARE_PROVIDER_SITE_OTHER): Payer: PPO | Admitting: Family Medicine

## 2016-06-13 VITALS — BP 126/78 | HR 104 | Temp 98.9°F | Wt 198.6 lb

## 2016-06-13 DIAGNOSIS — J9611 Chronic respiratory failure with hypoxia: Secondary | ICD-10-CM

## 2016-06-13 DIAGNOSIS — Z720 Tobacco use: Secondary | ICD-10-CM

## 2016-06-13 DIAGNOSIS — C44112 Basal cell carcinoma of skin of right eyelid, including canthus: Secondary | ICD-10-CM

## 2016-06-13 DIAGNOSIS — I10 Essential (primary) hypertension: Secondary | ICD-10-CM | POA: Diagnosis not present

## 2016-06-13 DIAGNOSIS — E785 Hyperlipidemia, unspecified: Secondary | ICD-10-CM | POA: Diagnosis not present

## 2016-06-13 DIAGNOSIS — J439 Emphysema, unspecified: Secondary | ICD-10-CM | POA: Diagnosis not present

## 2016-06-13 DIAGNOSIS — J9610 Chronic respiratory failure, unspecified whether with hypoxia or hypercapnia: Secondary | ICD-10-CM | POA: Insufficient documentation

## 2016-06-13 DIAGNOSIS — E559 Vitamin D deficiency, unspecified: Secondary | ICD-10-CM | POA: Diagnosis not present

## 2016-06-13 DIAGNOSIS — C44111 Basal cell carcinoma of skin of unspecified eyelid, including canthus: Secondary | ICD-10-CM

## 2016-06-13 LAB — VITAMIN D 25 HYDROXY (VIT D DEFICIENCY, FRACTURES): VITD: 36.54 ng/mL (ref 30.00–100.00)

## 2016-06-13 MED ORDER — VARENICLINE TARTRATE 0.5 MG X 11 & 1 MG X 42 PO MISC: ORAL | 0 refills | Status: DC

## 2016-06-13 NOTE — Assessment & Plan Note (Signed)
New skin lesion concern for basal cell carcinoma. Sending back to derm.

## 2016-06-13 NOTE — Assessment & Plan Note (Signed)
Trial of Chantix.

## 2016-06-13 NOTE — Progress Notes (Signed)
Subjective:  Patient ID: Kevin Gutierrez, male    DOB: [birth year]  Age: 66 y.o. MRN: [id number]  CC: Follow up  HPI:  66 year old male with COPD/Chronic respiratory failure, HTN, HLD, CKD presents for follow up. Concern about a skin lesion.  COPD, Chronic respiratory failure  Stable.  Compliant with Breo and spiriva.  Per pulm needs O2 but it has not gotten due to cost (patient states he has O2 at home but is not using it). Will need to discuss with pulm.  HTN  Stable on Amlodipine.  HLD  At goal on Lipitor.  Skin lesion  Located near the medial canthus.   Has been present for the past few months.  Getting larger.  Has long history of sun exposure.  Hx of basal cell.  Tobacco abuse  Still smoking.  Interested in quitting.  Will discuss today.  Social Hx   Social History   Social History  . Marital status: Married    Spouse name: N/A  . Number of children: N/A  . Years of education: N/A   Occupational History  . retired    Social History Main Topics  . Smoking status: Current Every Day Smoker    Packs/day: 0.50    Years: 50.00    Types: Cigarettes  . Smokeless tobacco: Never Used     Comment: Currently smoking 4 cigarettes daily  . Alcohol use No  . Drug use: Yes    Types: Marijuana     Comment: Occasional marijuana use  . Sexual activity: Not Asked   Other Topics Concern  . None   Social History Narrative   Lives with family    Review of Systems  Respiratory: Positive for shortness of breath.   Musculoskeletal:       Leg pain.  Skin:       Skin lesion.   Objective:  BP 126/78 (BP Location: Left Arm, Patient Position: Sitting, Cuff Size: Normal)   Pulse (!) 104   Temp 98.9 F (37.2 C) (Oral)   Wt 198 lb 9.6 oz (90.1 kg)   SpO2 90%   BMI 26.94 kg/m   BP/Weight 06/13/2016 04/05/2016 86/10/6718  Systolic BP 947 096 283  Diastolic BP 78 80 62  Wt. (Lbs) 198.6 194.2 192  BMI 26.94 26.34 26.04    Physical Exam    Constitutional: He is oriented to person, place, and time. He appears well-developed. No distress.  Cardiovascular: Normal rate and regular rhythm.   Pulmonary/Chest: Effort normal and breath sounds normal. No respiratory distress.  Neurological: He is alert and oriented to person, place, and time.  Skin:  Raised lesion, vasculature noted.  Psychiatric: He has a normal mood and affect.  Vitals reviewed.  Lab Results  Component Value Date   WBC 12.1 (H) 03/20/2016   HGB 14.3 03/20/2016   HCT 42.3 03/20/2016   PLT 179 03/20/2016   GLUCOSE 165 (H) 03/20/2016   CHOL 99 11/15/2015   TRIG 48.0 11/15/2015   HDL 37.40 (L) 11/15/2015   LDLCALC 52 11/15/2015   ALT 13 (L) 03/19/2016   AST 20 03/19/2016   NA 137 03/20/2016   K 4.1 03/20/2016   CL 103 03/20/2016   CREATININE 1.47 (H) 03/20/2016   BUN 29 (H) 03/20/2016   CO2 29 03/20/2016   PSA 0.71 05/17/2015   INR 0.99 05/04/2015   HGBA1C 5.8 05/04/2015    Assessment & Plan:   Problem List Items Addressed This Visit    Vitamin D  deficiency   Relevant Orders   Vitamin D (25 hydroxy) (Completed)   Tobacco abuse    Trial of Chantix.      Hyperlipidemia    At goal on Lipitor. Continue.      Essential hypertension    At goal on Norvasc, continue.      COPD (chronic obstructive pulmonary disease) (HCC) - Primary    Stable. Continue Breo, Spiriva, Albuterol.      Relevant Medications   varenicline (CHANTIX STARTING MONTH PAK) 0.5 MG X 11 & 1 MG X 42 tablet   Chronic respiratory failure (HCC)    Needs O2. Will discuss with pulm.      Basal cell carcinoma    New skin lesion concern for basal cell carcinoma. Sending back to derm.         Meds ordered this encounter  Medications  . varenicline (CHANTIX STARTING MONTH PAK) 0.5 MG X 11 & 1 MG X 42 tablet    Sig: 0.5 mg by mouth once daily for 3 days, then increase to 0.5 mg twice daily for 4 days, then increase to 1 mg tablet twice daily.    Dispense:  53 tablet     Refill:  0    Follow-up: 6 months.  [place]

## 2016-06-13 NOTE — Assessment & Plan Note (Signed)
Needs O2. Will discuss with pulm.

## 2016-06-13 NOTE — Assessment & Plan Note (Addendum)
At goal on Norvasc, continue.

## 2016-06-13 NOTE — Patient Instructions (Addendum)
Continue your meds.  Start the Chantix. If you tolerate let me know and I will continue for additional 2 months.  Follow up in 6 months.  Take care  Dr. Lacinda Axon

## 2016-06-13 NOTE — Assessment & Plan Note (Signed)
Stable. Continue Breo, Spiriva, Albuterol.

## 2016-06-13 NOTE — Progress Notes (Signed)
Pre visit review using our clinic review tool, if applicable. No additional management support is needed unless otherwise documented below in the visit note. 

## 2016-06-13 NOTE — Assessment & Plan Note (Signed)
At goal on Lipitor. Continue.

## 2016-06-14 ENCOUNTER — Telehealth: Payer: Self-pay

## 2016-06-14 NOTE — Telephone Encounter (Signed)
LVTCB

## 2016-06-14 NOTE — Telephone Encounter (Signed)
Patient advised of below and verbalized understanding.  

## 2016-06-14 NOTE — Telephone Encounter (Signed)
-----   Message from Coral Spikes, DO sent at 06/14/2016 10:13 AM EST ----- Advise patient that Pulmonology recommend 2L of O2 at night and with exertion.

## 2016-06-21 DIAGNOSIS — M5136 Other intervertebral disc degeneration, lumbar region: Secondary | ICD-10-CM | POA: Diagnosis not present

## 2016-06-21 DIAGNOSIS — G4709 Other insomnia: Secondary | ICD-10-CM | POA: Diagnosis not present

## 2016-06-21 DIAGNOSIS — J439 Emphysema, unspecified: Secondary | ICD-10-CM | POA: Diagnosis not present

## 2016-06-21 DIAGNOSIS — J9610 Chronic respiratory failure, unspecified whether with hypoxia or hypercapnia: Secondary | ICD-10-CM | POA: Diagnosis not present

## 2016-06-21 DIAGNOSIS — E785 Hyperlipidemia, unspecified: Secondary | ICD-10-CM | POA: Diagnosis not present

## 2016-06-21 DIAGNOSIS — N200 Calculus of kidney: Secondary | ICD-10-CM | POA: Diagnosis not present

## 2016-06-21 DIAGNOSIS — N183 Chronic kidney disease, stage 3 (moderate): Secondary | ICD-10-CM | POA: Diagnosis not present

## 2016-06-21 DIAGNOSIS — I10 Essential (primary) hypertension: Secondary | ICD-10-CM | POA: Diagnosis not present

## 2016-06-22 ENCOUNTER — Encounter: Payer: Self-pay | Admitting: Internal Medicine

## 2016-06-23 ENCOUNTER — Other Ambulatory Visit: Payer: Self-pay | Admitting: *Deleted

## 2016-06-23 MED ORDER — TIOTROPIUM BROMIDE MONOHYDRATE 18 MCG IN CAPS: 18.0000 ug | ORAL_CAPSULE | Freq: Every day | RESPIRATORY_TRACT | 5 refills | Status: DC

## 2016-06-29 DIAGNOSIS — J9611 Chronic respiratory failure with hypoxia: Secondary | ICD-10-CM | POA: Diagnosis not present

## 2016-06-29 DIAGNOSIS — R0602 Shortness of breath: Secondary | ICD-10-CM | POA: Diagnosis not present

## 2016-06-29 DIAGNOSIS — J449 Chronic obstructive pulmonary disease, unspecified: Secondary | ICD-10-CM | POA: Diagnosis not present

## 2016-07-03 ENCOUNTER — Telehealth: Payer: Self-pay | Admitting: Family Medicine

## 2016-07-03 ENCOUNTER — Ambulatory Visit (INDEPENDENT_AMBULATORY_CARE_PROVIDER_SITE_OTHER): Payer: PPO

## 2016-07-03 VITALS — BP 128/72 | HR 76 | Temp 97.9°F | Resp 14 | Ht 72.0 in | Wt 190.0 lb

## 2016-07-03 DIAGNOSIS — Z0000 Encounter for general adult medical examination without abnormal findings: Secondary | ICD-10-CM | POA: Diagnosis not present

## 2016-07-03 NOTE — Progress Notes (Signed)
Subjective:   Kevin Gutierrez is a 66 y.o. male who presents for an Initial Medicare Annual Wellness Visit.  Review of Systems  No ROS.  Medicare Wellness Visit.  Cardiac Risk Factors include: male gender;advanced age (>25mn, >>73women);hypertension    Objective:    Today's Vitals   07/03/16 0905  BP: 128/72  Pulse: 76  Resp: 14  Temp: 97.9 F (36.6 C)  TempSrc: Oral  SpO2: 90%  Weight: 190 lb (86.2 kg)  Height: 6' (1.829 m)   Body mass index is 25.77 kg/m.  Current Medications (verified) Outpatient Encounter Prescriptions as of 07/03/2016  Medication Sig  . acetaminophen (TYLENOL) 325 MG tablet Take 500 mg by mouth every 6 (six) hours as needed for moderate pain, fever or headache.   . albuterol (PROVENTIL) (2.5 MG/3ML) 0.083% nebulizer solution Take 3 mLs (2.5 mg total) by nebulization every 3 (three) hours as needed for wheezing or shortness of breath.  .Marland KitchenamLODipine (NORVASC) 5 MG tablet TAKE ONE TABLET BY MOUTH ONCE DAILY  . aspirin EC 81 MG tablet Take 1 tablet (81 mg total) by mouth daily.  .Marland Kitchenatorvastatin (LIPITOR) 20 MG tablet Take 1 tablet (20 mg total) by mouth daily.  . Cholecalciferol (VITAMIN D3 PO) Take 2,000 tablets by mouth daily.  . fluticasone furoate-vilanterol (BREO ELLIPTA) 200-25 MCG/INH AEPB Inhale 1 puff into the lungs daily.  . pantoprazole (PROTONIX) 40 MG tablet TAKE ONE TABLET BY MOUTH ONCE DAILY  . PROVENTIL HFA 108 (90 Base) MCG/ACT inhaler INHALE TWO PUFFS BY MOUTH EVERY 4 HOURS AS NEEDED FOR WHEEZING OR  SHORTNESS  OF  BREATH  . tiotropium (SPIRIVA) 18 MCG inhalation capsule Place 1 capsule (18 mcg total) into inhaler and inhale daily.  . varenicline (CHANTIX STARTING MONTH PAK) 0.5 MG X 11 & 1 MG X 42 tablet 0.5 mg by mouth once daily for 3 days, then increase to 0.5 mg twice daily for 4 days, then increase to 1 mg tablet twice daily.  . [DISCONTINUED] gabapentin (NEURONTIN) 100 MG capsule Take 100 mg by mouth at bedtime.  . [DISCONTINUED]  nicotine (NICODERM CQ - DOSED IN MG/24 HOURS) 21 mg/24hr patch Place 1 patch (21 mg total) onto the skin daily.  . [DISCONTINUED] Vitamin D, Ergocalciferol, (DRISDOL) 50000 units CAPS capsule Take 1 capsule (50,000 Units total) by mouth every 7 (seven) days.   No facility-administered encounter medications on file as of 07/03/2016.     Allergies (verified) Patient has no known allergies.   History: Past Medical History:  Diagnosis Date  . Chicken pox   . [place]   . History of kidney stones 05/17/2015  . History of stomach ulcers 05/17/2015  . Hypertension   . Kidney stones   . Nephrolithiasis   . Peptic ulcer disease   . Vitamin D deficiency    Past Surgical History:  Procedure Laterality Date  . LITHOTRIPSY    . SPINAL CORD DECOMPRESSION  03/08/1994   Family History  Problem Relation Age of Onset  . Diabetes Mellitus II Brother   . Lung cancer Brother   . Heart disease Brother   . Hyperlipidemia Brother   . Heart disease Mother   . Stroke Father   . Hypertension Brother    Social History   Occupational History  . retired    Social History Main Topics  . Smoking status: Current Every Day Smoker    Packs/day: 0.50    Years: 50.00    Types:  Cigarettes  . Smokeless tobacco: Never Used     Comment: Currently smoking 4 cigarettes daily  . Alcohol use No  . Drug use: Yes    Types: Marijuana     Comment: Occasional marijuana use  . Sexual activity: Yes   Tobacco Counseling Ready to quit: Not Answered Counseling given: Not Answered   Activities of Daily Living In your present state of health, do you have any difficulty performing the following activities: 07/03/2016 03/19/2016  Hearing? N N  Vision? Y N  Difficulty concentrating or making decisions? N N  Walking or climbing stairs? Y N  Dressing or bathing? N N  Doing errands, shopping? N N  Preparing Food and eating ? N -  Using the Toilet? N -  In the past six  months, have you accidently leaked urine? N -  Do you have problems with loss of bowel control? N -  Managing your Medications? N -  Managing your Finances? N -  Housekeeping or managing your Housekeeping? N -  Some recent data might be hidden    Immunizations and Health Maintenance Immunization History  Administered Date(s) Administered  . Influenza, High Dose Seasonal PF 02/21/2016  . Pneumococcal Conjugate-13 02/21/2016   Health Maintenance Due  Topic Date Due  . Hepatitis C Screening  1951-01-14  . Fecal DNA (Cologuard)  02/26/1999    Patient Care Team: Coral Spikes, DO as PCP - General (Family Medicine)  Indicate any recent Medical Services you may have received from other than [facility] providers in the past year (date may be approximate).    Assessment:   This is a routine wellness examination for Kevin Gutierrez. The goal of the wellness visit is to assist the patient how to close the gaps in care and create a preventative care plan for the patient.   Taking calcium VIT D3 as appropriate/Osteoporosis risk reviewed.  Medications reviewed; taking without issues or barriers.  Safety issues reviewed; smoke detectors in the home. Firearms locked up in the home. Wears seatbelts when driving or riding with others. Patient does wear sunscreen or protective clothing when in direct sunlight. No violence in the home.  Patient is alert, normal appearance, oriented to person/place/and time. Correctly identified the president of the [place], recall of 2/3 objects, and performing simple calculations.  Patient displays appropriate judgement and can read correct time from watch face.  No new identified risk were noted.  No failures at ADL's or IADL's.   BMI- discussed the importance of a healthy diet, water intake and exercise. Educational material provided.   HTN- followed by PCP.  Sleep patterns- Sleeps 4 hours at night.  Naps throughout the day. Wakes feeling rested.  Hepatitis C Screening  discussed, educational material provided.    Health maintenance gaps- closed.  Patient Concerns: Dermatologist referrel requested for raised area in the corner of R eye. Deferred to PCP for follow up.  Hearing/Vision screen Hearing Screening Comments: Patient is able to hear conversational tones without difficulty.  No issues reported.  Vision Screening Comments: He does not currently have an eye doctor. He wears corrective lenses daily Encouraged to establish and make an appointment with the eye doctor. Visual acuity not assessed per patient preference.  Dietary issues and exercise activities discussed: Current Exercise Habits: The patient does not participate in regular exercise at present  Goals    . Increase physical activity          Stay active and ride the exercise bike 3 days weekly, pace  as tolerated.     . Increase water intake          Stay hydrated and drink plenty of fluids/water      Depression Screen PHQ 2/9 Scores 07/03/2016 04/05/2016 11/15/2015  PHQ - 2 Score 0 0 0    Fall Risk Fall Risk  07/03/2016 04/05/2016 11/15/2015  Falls in the past year? Yes No No    Cognitive Function: MMSE - Mini Mental State Exam 07/03/2016  Orientation to time 5  Orientation to Place 5  Registration 3  Attention/ Calculation 5  Recall 2  Recall-comments 2 out of 3 words recalled  Language- name 2 objects 2  Language- repeat 1  Language- follow 3 step command 3  Language- read & follow direction 1  Write a sentence 1  Copy design 1  Total score 29        Screening Tests Health Maintenance  Topic Date Due  . Hepatitis C Screening  14-Mar-1951  . Fecal DNA (Cologuard)  02/26/1999  . PNA vac Low Risk Adult (2 of 2 - PPSV23) 02/20/2017  . TETANUS/TDAP  05/23/2020  . INFLUENZA VACCINE  Completed        Plan:    End of life planning; Advanced aging; Advanced directives discussed.  No HCPOA/Living Will.  Additional information provided to help them start the  conversation with family.  Copy of HCPOA/Living Will requested upon completion. Time spent on this topic is 25 minutes.  Medicare Attestation I have personally reviewed: The patient's medical and social history Their use of alcohol, tobacco or illicit drugs Their current medications and supplements The patient's functional ability including ADLs,fall risks, home safety risks, cognitive, and hearing and visual impairment Diet and physical activities Evidence for depression   The patient's weight, height, BMI, and visual acuity have been recorded in the chart.  I have made referrals and provided education to the patient based on review of the above and I have provided the patient with a written personalized care plan for preventive services.    Patient Instructions (the written plan) were given to the patient.   Varney Biles, LPN   07/06/9456

## 2016-07-03 NOTE — Patient Instructions (Addendum)
Kevin Gutierrez , Thank you for taking time to come for your Medicare Wellness Visit. I appreciate your ongoing commitment to your health goals. Please review the following plan we discussed and let me know if I can assist you in the future.  Follow up with Dr. Lacinda Axon as needed.    Bring a copy of your [place] and/or Living Will to be scanned into chart.  Have a great day!   These are the goals we discussed: Goals    . Increase physical activity          Stay active and ride the exercise bike 3 days weekly, pace as tolerated.     . Increase water intake          Stay hydrated and drink plenty of fluids/water       This is a list of the screening recommended for you and due dates:  Health Maintenance  Topic Date Due  .  Hepatitis C: One time screening is recommended by Center for Disease Control  (CDC) for  adults born from [birth year] through 1967.   January 25, 1951  . Cologuard (Stool DNA test)  02/26/1999  . Pneumonia vaccines (2 of 2 - PPSV23) 02/20/2017  . Tetanus Vaccine  05/23/2020  . Flu Shot  Completed

## 2016-07-03 NOTE — Telephone Encounter (Signed)
Pt needs a referral to see the Dermatologist for the corner of eye. Thank you!

## 2016-07-04 ENCOUNTER — Other Ambulatory Visit: Payer: Self-pay | Admitting: Family Medicine

## 2016-07-04 DIAGNOSIS — L989 Disorder of the skin and subcutaneous tissue, unspecified: Secondary | ICD-10-CM

## 2016-07-04 NOTE — Progress Notes (Signed)
Care was provided under my supervision. I agree with the management as indicated in the note.  Jay Haskew DO  

## 2016-07-11 ENCOUNTER — Encounter (INDEPENDENT_AMBULATORY_CARE_PROVIDER_SITE_OTHER): Payer: PPO

## 2016-07-11 ENCOUNTER — Encounter (INDEPENDENT_AMBULATORY_CARE_PROVIDER_SITE_OTHER): Payer: Self-pay

## 2016-07-11 ENCOUNTER — Ambulatory Visit (INDEPENDENT_AMBULATORY_CARE_PROVIDER_SITE_OTHER): Payer: Self-pay | Admitting: Vascular Surgery

## 2016-07-14 ENCOUNTER — Encounter: Payer: Self-pay | Admitting: Internal Medicine

## 2016-07-14 ENCOUNTER — Ambulatory Visit (INDEPENDENT_AMBULATORY_CARE_PROVIDER_SITE_OTHER): Payer: PPO | Admitting: Internal Medicine

## 2016-07-14 VITALS — BP 142/82 | HR 88 | Ht 72.0 in | Wt 188.4 lb

## 2016-07-14 DIAGNOSIS — J449 Chronic obstructive pulmonary disease, unspecified: Secondary | ICD-10-CM | POA: Diagnosis not present

## 2016-07-14 MED ORDER — FLUTICASONE FUROATE-VILANTEROL 200-25 MCG/INH IN AEPB: 1.0000 | INHALATION_SPRAY | Freq: Every day | RESPIRATORY_TRACT | 0 refills | Status: AC

## 2016-07-14 NOTE — Progress Notes (Signed)
Kevin Gutierrez     Date: 07/14/2016,   MRN# [id number] Kevin Gutierrez 02/24/1951 Code Status:  Code Status History    Date Active Date Inactive Code Status Order ID Comments User Context   05/04/2015  3:40 AM 05/05/2015  6:02 PM Full Code [id number]  Saundra Shelling, MD Inpatient     Hosp day:'@LENGTHOFSTAYDAYS'$ @ Referring MD: '@ATDPROV'$ @     PCP:      AdmissionWeight: 188 lb 6.4 oz (85.5 kg)                 CurrentWeight: 188 lb 6.4 oz (85.5 kg) Kevin Gutierrez is a 66 y.o. old male seen in [facility] for hsopital follow up for hemoptysis.   CHIEF COMPLAINT:   Follow up coughing and wheezing   HISTORY OF PRESENT ILLNESS   Patient breathing comfortably, but has intermittent wheezing no acute issues, still smokes but has cut down significantly On chantix  No fevers chills at this time No signs of infection at this time  Wears oxygen at night   CT chest shows emphysematous changes and Bronchial wall thickening   Repeat CT chows improvement of pneumonia, residual mucus still present  Doing well with inhaler therapy with BREo 200, spiriva does not seem to make much difference to him  PFT 05/2015 Ratio 60%  FEv1 59% predicted RD 118% DLCo 39%  Office SPIRO 01/18/16 Ratio 60% FEv1 1.73 L 47%   Fev1 was 59% and reduced down to 47% currently   Current Medication:   Current Outpatient Prescriptions:  .  acetaminophen (TYLENOL) 325 MG tablet, Take 500 mg by mouth every 6 (six) hours as needed for moderate pain, fever or headache. , Disp: , Rfl:  .  albuterol (PROVENTIL) (2.5 MG/3ML) 0.083% nebulizer solution, Take 3 mLs (2.5 mg total) by nebulization every 3 (three) hours as needed for wheezing or shortness of breath., Disp: 75 mL, Rfl: 12 .  amLODipine (NORVASC) 5 MG tablet, TAKE ONE TABLET BY MOUTH ONCE DAILY, Disp: 90 tablet, Rfl: 3 .  aspirin EC 81 MG tablet, Take 1 tablet (81 mg total) by mouth daily., Disp: , Rfl:  .  atorvastatin  (LIPITOR) 20 MG tablet, Take 1 tablet (20 mg total) by mouth daily., Disp: 90 tablet, Rfl: 3 .  Cholecalciferol (VITAMIN D3 PO), Take 2,000 mg by mouth daily. , Disp: , Rfl:  .  fluticasone furoate-vilanterol (BREO ELLIPTA) 200-25 MCG/INH AEPB, Inhale 1 puff into the lungs daily., Disp: 1 each, Rfl: 0 .  pantoprazole (PROTONIX) 40 MG tablet, TAKE ONE TABLET BY MOUTH ONCE DAILY, Disp: 90 tablet, Rfl: 0 .  PROVENTIL HFA 108 (90 Base) MCG/ACT inhaler, INHALE TWO PUFFS BY MOUTH EVERY 4 HOURS AS NEEDED FOR WHEEZING OR  SHORTNESS  OF  BREATH, Disp: 7 each, Rfl: 2 .  tiotropium (SPIRIVA) 18 MCG inhalation capsule, Place 1 capsule (18 mcg total) into inhaler and inhale daily., Disp: 30 capsule, Rfl: 5 .  varenicline (CHANTIX STARTING MONTH PAK) 0.5 MG X 11 & 1 MG X 42 tablet, 0.5 mg by mouth once daily for 3 days, then increase to 0.5 mg twice daily for 4 days, then increase to 1 mg tablet twice daily., Disp: 53 tablet, Rfl: 0     ALLERGIES   Patient has no known allergies.     REVIEW OF SYSTEMS   Review of Systems  Constitutional: Negative for chills, fever, malaise/fatigue and weight loss.  HENT: Negative for congestion.   Respiratory: Positive for shortness of  breath. Negative for cough, hemoptysis, sputum production and wheezing.   Cardiovascular: Negative for chest pain and leg swelling.  Gastrointestinal: Negative for heartburn.  Neurological: Negative for dizziness.  All other systems reviewed and are negative.    VS: BP (!) 142/82 (BP Location: Left Arm, Cuff Size: Normal)   Pulse 88   Ht 6' (1.829 m)   Wt 188 lb 6.4 oz (85.5 kg)   SpO2 94%   BMI 25.55 kg/m      PHYSICAL EXAM   Physical Exam  Constitutional: He is oriented to person, place, and time. No distress.  Cardiovascular: Normal rate, regular rhythm and normal heart sounds.   No murmur heard. Pulmonary/Chest: Effort normal. No stridor. No respiratory distress. He has no wheezes. He has no rales.    Musculoskeletal: Normal range of motion. He exhibits no edema.  Neurological: He is alert and oriented to person, place, and time.  Psychiatric: He has a normal mood and affect.         ASSESSMENT/PLAN    66 yo white male with PFT's c/w Moderate/Severe COPD with diffusion impairment with air trapping  GOLD Stage C Patient with chronic  hypoxic resp failure, still smoking   1.continue BRE0 200 2.stop spiriva, albuterol as needed 3.smoking cessation strongly advised 4.oxygen at night, patient refuses to wear oxygen with exertion  I anticipate recurrent bouts of exacerbations in the future due to his underlying disease and continues to smoke  Follow up in 6 months   Patient satisfied with Plan of action and management. All questions answered   Corrin Parker, M.D.  [facility]  Medical Director [place] Director [facility] Cardio-Pulmonary Department

## 2016-07-14 NOTE — Patient Instructions (Signed)
Continue BREo 200 Stop Spiriva Albuterol as needed

## 2016-07-17 DIAGNOSIS — C44112 Basal cell carcinoma of skin of right eyelid, including canthus: Secondary | ICD-10-CM | POA: Diagnosis not present

## 2016-07-17 DIAGNOSIS — Z85828 Personal history of other malignant neoplasm of skin: Secondary | ICD-10-CM | POA: Diagnosis not present

## 2016-07-17 DIAGNOSIS — C44319 Basal cell carcinoma of skin of other parts of face: Secondary | ICD-10-CM | POA: Diagnosis not present

## 2016-07-18 ENCOUNTER — Encounter: Payer: Self-pay | Admitting: Internal Medicine

## 2016-07-18 ENCOUNTER — Other Ambulatory Visit: Payer: Self-pay | Admitting: *Deleted

## 2016-07-18 MED ORDER — ALBUTEROL SULFATE HFA 108 (90 BASE) MCG/ACT IN AERS: 2.0000 | INHALATION_SPRAY | RESPIRATORY_TRACT | 3 refills | Status: AC | PRN

## 2016-07-22 ENCOUNTER — Encounter: Payer: Self-pay | Admitting: Emergency Medicine

## 2016-07-22 ENCOUNTER — Emergency Department
Admission: EM | Admit: 2016-07-22 | Discharge: 2016-07-22 | Disposition: A | Discharge status: Home / Self Care | Payer: PPO | Attending: Emergency Medicine | Admitting: Emergency Medicine

## 2016-07-22 ENCOUNTER — Emergency Department: Payer: PPO

## 2016-07-22 DIAGNOSIS — N201 Calculus of ureter: Secondary | ICD-10-CM | POA: Diagnosis not present

## 2016-07-22 DIAGNOSIS — F17210 Nicotine dependence, cigarettes, uncomplicated: Secondary | ICD-10-CM | POA: Insufficient documentation

## 2016-07-22 DIAGNOSIS — J449 Chronic obstructive pulmonary disease, unspecified: Secondary | ICD-10-CM | POA: Diagnosis not present

## 2016-07-22 DIAGNOSIS — R109 Unspecified abdominal pain: Secondary | ICD-10-CM

## 2016-07-22 DIAGNOSIS — N200 Calculus of kidney: Secondary | ICD-10-CM | POA: Diagnosis not present

## 2016-07-22 DIAGNOSIS — F1290 Cannabis use, unspecified, uncomplicated: Secondary | ICD-10-CM | POA: Diagnosis not present

## 2016-07-22 DIAGNOSIS — Z79899 Other long term (current) drug therapy: Secondary | ICD-10-CM | POA: Insufficient documentation

## 2016-07-22 DIAGNOSIS — N183 Chronic kidney disease, stage 3 (moderate): Secondary | ICD-10-CM | POA: Insufficient documentation

## 2016-07-22 DIAGNOSIS — I129 Hypertensive chronic kidney disease with stage 1 through stage 4 chronic kidney disease, or unspecified chronic kidney disease: Secondary | ICD-10-CM | POA: Insufficient documentation

## 2016-07-22 DIAGNOSIS — R52 Pain, unspecified: Secondary | ICD-10-CM

## 2016-07-22 DIAGNOSIS — Z7982 Long term (current) use of aspirin: Secondary | ICD-10-CM | POA: Diagnosis not present

## 2016-07-22 LAB — URINALYSIS, COMPLETE (UACMP) WITH MICROSCOPIC
BACTERIA UA: NONE SEEN
BILIRUBIN URINE: NEGATIVE
Glucose, UA: NEGATIVE mg/dL
Ketones, ur: NEGATIVE mg/dL
Leukocytes, UA: NEGATIVE
Nitrite: NEGATIVE
PROTEIN: 30 mg/dL — AB
SPECIFIC GRAVITY, URINE: 1.009 (ref 1.005–1.030)
pH: 6 (ref 5.0–8.0)

## 2016-07-22 LAB — COMPREHENSIVE METABOLIC PANEL
ALBUMIN: 3.7 g/dL (ref 3.5–5.0)
ALK PHOS: 85 U/L (ref 38–126)
ALT: 10 U/L — AB (ref 17–63)
AST: 16 U/L (ref 15–41)
Anion gap: 9 (ref 5–15)
BUN: 14 mg/dL (ref 6–20)
CALCIUM: 8.9 mg/dL (ref 8.9–10.3)
CO2: 27 mmol/L (ref 22–32)
CREATININE: 1.34 mg/dL — AB (ref 0.61–1.24)
Chloride: 102 mmol/L (ref 101–111)
GFR calc Af Amer: >60 mL/min (ref >60)
GFR calc non Af Amer: 53 mL/min — ABNORMAL LOW (ref >60)
GLUCOSE: 161 mg/dL — AB (ref 65–99)
Potassium: 3.6 mmol/L (ref 3.5–5.1)
Sodium: 138 mmol/L (ref 135–145)
Total Bilirubin: 0.8 mg/dL (ref 0.3–1.2)
Total Protein: 7.3 g/dL (ref 6.5–8.1)

## 2016-07-22 LAB — CBC
HCT: 46.3 % (ref 40.0–52.0)
HEMOGLOBIN: 15.7 g/dL (ref 13.0–18.0)
MCH: 32.4 pg (ref 26.0–34.0)
MCHC: 34 g/dL (ref 32.0–36.0)
MCV: 95.3 fL (ref 80.0–100.0)
Platelets: 183 10*3/uL (ref 150–440)
RBC: 4.86 MIL/uL (ref 4.40–5.90)
RDW: 14.6 % — ABNORMAL HIGH (ref 11.5–14.5)
WBC: 8.2 10*3/uL (ref 3.8–10.6)

## 2016-07-22 MED ORDER — HYDROCODONE-ACETAMINOPHEN 5-325 MG PO TABS
1.0000 | ORAL_TABLET | Freq: Once | ORAL | Status: AC
Start: 2016-07-22 — End: 2016-07-22
  Administered 2016-07-22: 1 via ORAL

## 2016-07-22 MED ORDER — ONDANSETRON HCL 4 MG/2ML IJ SOLN
4.0000 mg | Freq: Once | INTRAMUSCULAR | Status: AC
  Administered 2016-07-22: 4 mg via INTRAVENOUS
  Filled 2016-07-22: qty 2

## 2016-07-22 MED ORDER — HYDROCODONE-ACETAMINOPHEN 5-325 MG PO TABS
ORAL_TABLET | ORAL | Status: AC
  Filled 2016-07-22: qty 1

## 2016-07-22 MED ORDER — MORPHINE SULFATE (PF) 4 MG/ML IV SOLN
4.0000 mg | Freq: Once | INTRAVENOUS | Status: AC
  Administered 2016-07-22: 4 mg via INTRAVENOUS
  Filled 2016-07-22: qty 1

## 2016-07-22 NOTE — ED Notes (Signed)
Patient transported to X-ray 

## 2016-07-22 NOTE — ED Provider Notes (Addendum)
[facility] Emergency Department Provider Note   ____________________________________________   First MD Initiated Contact with Patient 07/22/16 0740     (approximate)  I have reviewed the triage vital signs and the nursing notes.   HISTORY  Chief Complaint Flank Pain    HPI Kevin Gutierrez is a 66 y.o. male who complains of symptoms consistent with his previous episodes of renal colic. 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 Left side or she kidney is atrophic. His been present for 2 days but got much worse today. Reports it feels exactly like his renal colic but usually the renal colic is crampy gets better and worse and worse and this is just battle time. He's not had any dysuria. Has not had any fever. Pain is severe.   Past Medical History:  Diagnosis Date  . Chicken pox   . COPD (chronic obstructive pulmonary disease) ([place])   . History of kidney stones 05/17/2015  . History of stomach ulcers 05/17/2015  . Hypertension   . Kidney stones   . Nephrolithiasis   . Peptic ulcer disease   . Vitamin D deficiency     Patient Active Problem List   Diagnosis Date Noted  . Chronic respiratory failure ([place]) 06/13/2016  . Anxiety state 04/05/2016  . DDD (degenerative disc disease), lumbar 03/14/2016  . Pain in both lower legs 02/22/2016  . Vitamin D deficiency 02/22/2016  . Hyperlipidemia 11/15/2015  . Basal cell carcinoma 11/15/2015  . CKD (chronic kidney disease) stage 3, GFR 30-59 ml/min 05/19/2015  . Preventative health care 05/17/2015  . COPD (chronic obstructive pulmonary disease) ([place]) 05/17/2015  . Insomnia 05/17/2015  . Essential hypertension 05/05/2015  . Tobacco abuse 05/05/2015    Past Surgical History:  Procedure Laterality Date  . LITHOTRIPSY    . SPINAL CORD DECOMPRESSION  03/08/1994    Prior to Admission medications   Medication Sig Start Date End Date Taking? Authorizing Provider  acetaminophen (TYLENOL) 325  MG tablet Take 500 mg by mouth every 6 (six) hours as needed for moderate pain, fever or headache.     Historical Provider, MD  albuterol (PROVENTIL) (2.5 MG/3ML) 0.083% nebulizer solution Take 3 mLs (2.5 mg total) by nebulization every 3 (three) hours as needed for wheezing or shortness of breath. 05/05/15   Theodoro Grist, MD  albuterol (VENTOLIN HFA) 108 (90 Base) MCG/ACT inhaler Inhale 2 puffs into the lungs every 4 (four) hours as needed for wheezing or shortness of breath. 07/18/16   Flora Lipps, MD  amLODipine (NORVASC) 5 MG tablet TAKE ONE TABLET BY MOUTH ONCE DAILY 05/31/16   Coral Spikes, DO  aspirin EC 81 MG tablet Take 1 tablet (81 mg total) by mouth daily. 05/17/15   Coral Spikes, DO  atorvastatin (LIPITOR) 20 MG tablet Take 1 tablet (20 mg total) by mouth daily. 05/21/15   Coral Spikes, DO  Cholecalciferol (VITAMIN D3 PO) Take 2,000 mg by mouth daily.     Historical Provider, MD  fluticasone furoate-vilanterol (BREO ELLIPTA) 200-25 MCG/INH AEPB Inhale 1 puff into the lungs daily. 03/27/16   Coral Spikes, DO  pantoprazole (PROTONIX) 40 MG tablet TAKE ONE TABLET BY MOUTH ONCE DAILY 05/29/16   Coral Spikes, DO  tiotropium (SPIRIVA) 18 MCG inhalation capsule Place 1 capsule (18 mcg total) into inhaler and inhale daily. 06/23/16   Flora Lipps, MD  varenicline (CHANTIX STARTING MONTH PAK) 0.5 MG X 11 & 1 MG X 42 tablet 0.5  mg by mouth once daily for 3 days, then increase to 0.5 mg twice daily for 4 days, then increase to 1 mg tablet twice daily. 06/13/16   Coral Spikes, DO    Allergies Patient has no known allergies.  Family History  Problem Relation Age of Onset  . Diabetes Mellitus II Brother   . Lung cancer Brother   . Heart disease Brother   . Hyperlipidemia Brother   . Heart disease Mother   . Stroke Father   . Hypertension Brother     Social History Social History  Substance Use Topics  . Smoking status: Current Every Day Smoker    Packs/day: 0.50    Years: 50.00    Types:  Cigarettes  . Smokeless tobacco: Never Used     Comment: Currently smoking 4 cigarettes daily  . Alcohol use No    Review of Systems Constitutional: No fever/chills Eyes: No visual changes. ENT: No sore throat. Cardiovascular: Denies chest pain. Respiratory: Denies shortness of breath. Gastrointestinal: See history of present illness Genitourinary: Negative for dysuria. Musculoskeletal: Negative for back pain. Skin: Acute colitis on abdomen  10-point ROS otherwise negative.  ____________________________________________   PHYSICAL EXAM:  VITAL SIGNS: ED Triage Vitals  Enc Vitals Group     BP 07/22/16 0724 (!) 158/97     Pulse Rate 07/22/16 0724 80     Resp 07/22/16 0724 16     Temp 07/22/16 0724 97.8 F (36.6 C)     Temp Source 07/22/16 0724 Oral     SpO2 07/22/16 0724 92 %     Weight 07/22/16 0726 188 lb (85.3 kg)     Height 07/22/16 0726 6' (1.829 m)     Head Circumference --      Peak Flow --      Pain Score 07/22/16 0723 7     Pain Loc --      Pain Edu? --      Excl. in [place]? --     Constitutional: Alert and oriented. Well appearing and in no acute distress. Eyes: Conjunctivae are normal. PERRL. EOMI. Head: Atraumatic. Nose: No congestion/rhinnorhea. Mouth/Throat: Mucous membranes are moist.  Oropharynx non-erythematous. Neck: No stridor. Cardiovascular: Normal rate, regular rhythm. Grossly normal heart sounds.  Good peripheral circulation. Respiratory: Normal respiratory effort.  No retractions. Lungs CTAB. Gastrointestinal: Soft Tender to palpation and percussion on the left side especially in the left upper quadrant. No distention. No abdominal bruits. No CVA tenderness! Musculoskeletal: No lower extremity tenderness nor edema.  No joint effusions. Neurologic:  Normal speech and language. No gross focal neurologic deficits are appreciated. Skin:  Skin is warm, dry and intact. No rash noted. Psychiatric: Mood and affect are normal. Speech and behavior are  normal.  ____________________________________________   LABS (all labs ordered are listed, but only abnormal results are displayed)  Labs Reviewed  CBC - Abnormal; Notable for the following:       Result Value   RDW 14.6 (*)    All other components within normal limits  COMPREHENSIVE METABOLIC PANEL - Abnormal; Notable for the following:    Glucose, Bld 161 (*)    Creatinine, Ser 1.34 (*)    ALT 10 (*)    GFR calc non Af Amer 53 (*)    All other components within normal limits  URINALYSIS, COMPLETE (UACMP) WITH MICROSCOPIC - Abnormal; Notable for the following:    Color, Urine YELLOW (*)    APPearance CLEAR (*)    Hgb urine dipstick MODERATE (*)  Protein, ur 30 (*)    Squamous Epithelial / LPF 0-5 (*)    All other components within normal limits   ____________________________________________  EKG   ____________________________________________  RADIOLOGY  Study Result   CLINICAL DATA:  Left flank pain for the past 2 days.  EXAM: CT ABDOMEN AND PELVIS WITHOUT CONTRAST  TECHNIQUE: Multidetector CT imaging of the abdomen and pelvis was performed following the standard protocol without IV contrast.  COMPARISON:  Abdomen and pelvis radiographs obtained earlier today. Abdomen and pelvis CT dated 03/09/2016.  FINDINGS: Lower chest: Stable areas of cylindrical bronchiectasis in both lower lobes with associated parenchymal density.  Hepatobiliary: No focal liver abnormality is seen. No gallstones, gallbladder wall thickening, or biliary dilatation.  Pancreas: Unremarkable. No pancreatic ductal dilatation or surrounding inflammatory changes.  Spleen: Normal in size without focal abnormality.  Adrenals/Urinary Tract: Normal appearing adrenal glands. Previously demonstrated horseshoe kidney with marked atrophy of the left moiety. Multiple bilateral renal calculi are again demonstrated. The largest on the left is in the region of the ureteropelvic  junction and measures 1.8 cm in diameter. The largest on the right is a staghorn calculus in the renal pelvis with a maximum diameter of 2.3 cm. The previously demonstrated 6 mm distal left ureteral calculus is stable with no ureteral dilatation. There is an interval 5 mm calculus in the dependent portion of the urinary bladder. No right ureteral calculi are seen. Mild dilatation of the right renal collecting system is unchanged.  Stomach/Bowel: Stomach is within normal limits. Appendix appears normal. No evidence of bowel wall thickening, distention, or inflammatory changes.  Vascular/Lymphatic: Atheromatous arterial calcifications, including the abdominal aorta. No enlarged lymph nodes.  Reproductive: Minimally enlarged prostate gland containing a small amount of coarse calcification.  Other: Small umbilical hernia containing fat.  Musculoskeletal: Lumbar spine degenerative changes including large anterior spurs and moderate to marked disc space narrowing at the L5-S1 level with moderate diffuse posterior disc bulging. The disc bulging is more pronounced posteriorly on the left and there is mild bilateral facet hypertrophy at these levels. These changes are producing mild canal stenosis and mild flattening of the proximal left S1 nerve root without evidence of nerve root compression otherwise.  IMPRESSION: 1. 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 2. Stable bilateral renal calculi a, including a right staghorn calculus causing mild right hydronephrosis without significant change. 3. Stable 6 mm nonobstructing distal left ureteral calculus. 4. Interval 5 mm bladder calculus. 5. Aortic atherosclerosis. 6. Lumbar spine degenerative changes, most pronounced at the L5-S1 level, as described above.   Electronically Signed   By: Claudie Revering M.D.   On: 07/22/2016 10:13      ____________________________________________   PROCEDURES  Procedure(s) performed:   Procedures  Critical Care performed:   ____________________________________________   INITIAL IMPRESSION / ASSESSMENT AND PLAN / ED COURSE  Pertinent labs & imaging results that were available during my care of the patient were reviewed by me and considered in my medical decision making (see chart for details).   Patient reports the pain is now under control. It is really not changed or moved. There does not seem to be a good explanation for this pain at present. However the patient is stable and reports it feels like his renal colic. Patient reports he will return for any further worsening or any other new problems. I will let him go.     ____________________________________________   FINAL CLINICAL IMPRESSION(S) / ED DIAGNOSES  Final diagnoses:  Pain  Left flank pain  Nephrolithiasis      NEW MEDICATIONS STARTED DURING THIS VISIT:  New Prescriptions   No medications on file     Note:  This document was prepared using Dragon voice recognition software and may include unintentional dictation errors.    Nena Polio, MD 07/22/16 [place], MD 07/22/16 [phone]

## 2016-07-22 NOTE — ED Triage Notes (Signed)
C/O left flank pain x 2 days.  Patient has history of renal colic.

## 2016-07-22 NOTE — Discharge Instructions (Signed)
Please return for increased pain fever vomiting or feeling sicker. Please call your urologist Monday and let him know what's going on. Use the Percocet one or 2 pills 4 times a day as needed for pain. Be carefully can make you constipated or sleepy. Do not drive or taking them.

## 2016-07-22 NOTE — ED Notes (Signed)
Patient transported to CT 

## 2016-07-25 DIAGNOSIS — N200 Calculus of kidney: Secondary | ICD-10-CM | POA: Diagnosis not present

## 2016-07-26 DIAGNOSIS — N200 Calculus of kidney: Secondary | ICD-10-CM | POA: Diagnosis not present

## 2016-07-30 DIAGNOSIS — J9611 Chronic respiratory failure with hypoxia: Secondary | ICD-10-CM | POA: Diagnosis not present

## 2016-07-30 DIAGNOSIS — J449 Chronic obstructive pulmonary disease, unspecified: Secondary | ICD-10-CM | POA: Diagnosis not present

## 2016-07-30 DIAGNOSIS — R0602 Shortness of breath: Secondary | ICD-10-CM | POA: Diagnosis not present

## 2016-08-04 ENCOUNTER — Telehealth: Payer: Self-pay | Admitting: Family Medicine

## 2016-08-04 MED ORDER — PANTOPRAZOLE SODIUM 40 MG PO TBEC: 40.0000 mg | DELAYED_RELEASE_TABLET | Freq: Every day | ORAL | 0 refills | Status: AC

## 2016-08-04 MED ORDER — ALBUTEROL SULFATE (2.5 MG/3ML) 0.083% IN NEBU: 2.5000 mg | INHALATION_SOLUTION | RESPIRATORY_TRACT | 12 refills | Status: AC | PRN

## 2016-08-04 MED ORDER — TIOTROPIUM BROMIDE MONOHYDRATE 18 MCG IN CAPS: 18.0000 ug | ORAL_CAPSULE | Freq: Every day | RESPIRATORY_TRACT | 5 refills | Status: DC

## 2016-08-04 MED ORDER — ATORVASTATIN CALCIUM 20 MG PO TABS: 20.0000 mg | ORAL_TABLET | Freq: Every day | ORAL | 3 refills | Status: AC

## 2016-08-04 MED ORDER — AMLODIPINE BESYLATE 5 MG PO TABS: 5.0000 mg | ORAL_TABLET | Freq: Every day | ORAL | 3 refills | Status: DC

## 2016-08-04 NOTE — Telephone Encounter (Signed)
rx sent

## 2016-08-04 NOTE — Telephone Encounter (Signed)
Pt spouse called back and needs new rx's for albuterol (PROVENTIL) (2.5 MG/3ML) 0.083% nebulizer solution, amLODipine (NORVASC) 5 MG tablet, atorvastatin (LIPITOR) 20 MG tablet, pantoprazole (PROTONIX) 40 MG tablet, and tiotropium (SPIRIVA) 18 MCG inhalation capsule. Please advise, thank you!  [place] (N), [place]  Call pt @ [phone]

## 2016-08-08 ENCOUNTER — Encounter: Payer: Self-pay | Admitting: Internal Medicine

## 2016-08-09 ENCOUNTER — Other Ambulatory Visit: Payer: Self-pay | Admitting: *Deleted

## 2016-08-09 MED ORDER — NYSTATIN 100000 UNIT/ML MT SUSP: 5.0000 mL | Freq: Four times a day (QID) | OROMUCOSAL | 0 refills | Status: DC

## 2016-08-09 NOTE — Telephone Encounter (Signed)
Per DK, send in 100,000u and not 600,000u of Nystatin Q6hrs x 5 days. Pt emailed back and informed medication is sent. Nothing further needed.

## 2016-08-16 DIAGNOSIS — N200 Calculus of kidney: Secondary | ICD-10-CM | POA: Diagnosis not present

## 2016-08-16 DIAGNOSIS — J439 Emphysema, unspecified: Secondary | ICD-10-CM | POA: Diagnosis not present

## 2016-08-16 DIAGNOSIS — N202 Calculus of kidney with calculus of ureter: Secondary | ICD-10-CM | POA: Diagnosis not present

## 2016-08-16 DIAGNOSIS — N189 Chronic kidney disease, unspecified: Secondary | ICD-10-CM | POA: Diagnosis not present

## 2016-08-16 DIAGNOSIS — J449 Chronic obstructive pulmonary disease, unspecified: Secondary | ICD-10-CM | POA: Diagnosis not present

## 2016-08-16 DIAGNOSIS — N201 Calculus of ureter: Secondary | ICD-10-CM | POA: Diagnosis not present

## 2016-08-16 DIAGNOSIS — Q631 Lobulated, fused and horseshoe kidney: Secondary | ICD-10-CM | POA: Diagnosis not present

## 2016-08-16 DIAGNOSIS — F17200 Nicotine dependence, unspecified, uncomplicated: Secondary | ICD-10-CM | POA: Diagnosis not present

## 2016-08-21 ENCOUNTER — Encounter: Payer: Self-pay | Admitting: Family Medicine

## 2016-08-22 DIAGNOSIS — J449 Chronic obstructive pulmonary disease, unspecified: Secondary | ICD-10-CM | POA: Diagnosis not present

## 2016-08-22 DIAGNOSIS — J439 Emphysema, unspecified: Secondary | ICD-10-CM | POA: Diagnosis not present

## 2016-08-23 NOTE — Addendum Note (Signed)
Addended by: Lorane Gell on: 08/23/2016 03:39 PM   Modules accepted: Level of Service

## 2016-08-29 DIAGNOSIS — J449 Chronic obstructive pulmonary disease, unspecified: Secondary | ICD-10-CM | POA: Diagnosis not present

## 2016-08-29 DIAGNOSIS — J9611 Chronic respiratory failure with hypoxia: Secondary | ICD-10-CM | POA: Diagnosis not present

## 2016-08-29 DIAGNOSIS — R0602 Shortness of breath: Secondary | ICD-10-CM | POA: Diagnosis not present

## 2016-08-30 ENCOUNTER — Encounter: Payer: Self-pay | Admitting: Internal Medicine

## 2016-08-30 ENCOUNTER — Other Ambulatory Visit: Payer: Self-pay | Admitting: *Deleted

## 2016-08-30 ENCOUNTER — Encounter: Payer: Self-pay | Admitting: Family Medicine

## 2016-08-30 MED ORDER — TIOTROPIUM BROMIDE MONOHYDRATE 18 MCG IN CAPS: 18.0000 ug | ORAL_CAPSULE | Freq: Every day | RESPIRATORY_TRACT | 5 refills | Status: DC

## 2016-09-11 DIAGNOSIS — Z01818 Encounter for other preprocedural examination: Secondary | ICD-10-CM | POA: Diagnosis not present

## 2016-09-11 DIAGNOSIS — I10 Essential (primary) hypertension: Secondary | ICD-10-CM | POA: Diagnosis not present

## 2016-09-11 DIAGNOSIS — J449 Chronic obstructive pulmonary disease, unspecified: Secondary | ICD-10-CM | POA: Diagnosis not present

## 2016-09-11 DIAGNOSIS — C44112 Basal cell carcinoma of skin of right eyelid, including canthus: Secondary | ICD-10-CM | POA: Diagnosis not present

## 2016-09-11 DIAGNOSIS — Z720 Tobacco use: Secondary | ICD-10-CM | POA: Diagnosis not present

## 2016-09-11 DIAGNOSIS — K219 Gastro-esophageal reflux disease without esophagitis: Secondary | ICD-10-CM | POA: Diagnosis not present

## 2016-09-18 ENCOUNTER — Encounter: Payer: Self-pay | Admitting: Internal Medicine

## 2016-09-18 ENCOUNTER — Encounter: Payer: Self-pay | Admitting: Family Medicine

## 2016-09-21 ENCOUNTER — Encounter: Payer: Self-pay | Admitting: Family Medicine

## 2016-09-22 DIAGNOSIS — J439 Emphysema, unspecified: Secondary | ICD-10-CM | POA: Diagnosis not present

## 2016-09-22 DIAGNOSIS — J449 Chronic obstructive pulmonary disease, unspecified: Secondary | ICD-10-CM | POA: Diagnosis not present

## 2016-09-25 ENCOUNTER — Encounter: Payer: Self-pay | Admitting: Internal Medicine

## 2016-09-25 ENCOUNTER — Other Ambulatory Visit: Payer: Self-pay | Admitting: Internal Medicine

## 2016-09-25 MED ORDER — PREDNISONE 20 MG PO TABS: 40.0000 mg | ORAL_TABLET | Freq: Every day | ORAL | 0 refills | Status: DC

## 2016-09-29 DIAGNOSIS — R0602 Shortness of breath: Secondary | ICD-10-CM | POA: Diagnosis not present

## 2016-09-29 DIAGNOSIS — J9611 Chronic respiratory failure with hypoxia: Secondary | ICD-10-CM | POA: Diagnosis not present

## 2016-09-29 DIAGNOSIS — J449 Chronic obstructive pulmonary disease, unspecified: Secondary | ICD-10-CM | POA: Diagnosis not present

## 2016-10-02 DIAGNOSIS — C44112 Basal cell carcinoma of skin of right eyelid, including canthus: Secondary | ICD-10-CM | POA: Diagnosis not present

## 2016-10-03 ENCOUNTER — Encounter: Payer: Self-pay | Admitting: Internal Medicine

## 2016-10-04 ENCOUNTER — Encounter: Payer: Self-pay | Admitting: Internal Medicine

## 2016-10-05 ENCOUNTER — Other Ambulatory Visit: Payer: Self-pay | Admitting: Internal Medicine

## 2016-10-05 MED ORDER — NYSTATIN 100000 UNIT/ML MT SUSP: 5.0000 mL | Freq: Four times a day (QID) | OROMUCOSAL | 1 refills | Status: DC

## 2016-10-06 DIAGNOSIS — C44112 Basal cell carcinoma of skin of right eyelid, including canthus: Secondary | ICD-10-CM | POA: Diagnosis not present

## 2016-10-11 ENCOUNTER — Encounter: Payer: Self-pay | Admitting: Family Medicine

## 2016-10-12 NOTE — Telephone Encounter (Signed)
Spoke with the patient, offered app tto day, declined and scheduled for Friday, thanks

## 2016-10-13 ENCOUNTER — Ambulatory Visit (INDEPENDENT_AMBULATORY_CARE_PROVIDER_SITE_OTHER): Payer: PPO | Admitting: Family Medicine

## 2016-10-13 ENCOUNTER — Encounter: Payer: Self-pay | Admitting: Family Medicine

## 2016-10-13 DIAGNOSIS — F411 Generalized anxiety disorder: Secondary | ICD-10-CM

## 2016-10-13 DIAGNOSIS — J439 Emphysema, unspecified: Secondary | ICD-10-CM

## 2016-10-13 DIAGNOSIS — R600 Localized edema: Secondary | ICD-10-CM

## 2016-10-13 MED ORDER — LORAZEPAM 0.5 MG PO TABS: 0.5000 mg | ORAL_TABLET | Freq: Two times a day (BID) | ORAL | 1 refills | Status: DC | PRN

## 2016-10-13 MED ORDER — HYDRALAZINE HCL 10 MG PO TABS: 5.0000 mg | ORAL_TABLET | Freq: Three times a day (TID) | ORAL | 0 refills | Status: DC

## 2016-10-13 NOTE — Patient Instructions (Signed)
We will arrange for you to see Kasa.  Stop Amlodipine.  Start Hydralazine.  Follow up in 2 weeks.  Take care  Dr. Lacinda Axon

## 2016-10-13 NOTE — Assessment & Plan Note (Signed)
Worsening. Ativan as needed. Advised against frequent use.

## 2016-10-13 NOTE — Assessment & Plan Note (Signed)
New problem. Stopping amlodipine. Starting hydralazine in this place for hypertension.

## 2016-10-13 NOTE — Progress Notes (Signed)
Subjective:  Patient ID: Kevin Gutierrez, male    DOB: 11/01/1950  Age: 66 y.o. MRN: [id number]  CC: Foot/leg swelling; Anxiety; COPD  HPI:  66 year old male with hypertension, COPD and chronic respiratory failure, CKD, nephrolithiasis, anxiety presents with the above complaints.  Lower extremity edema  Has been worsening over the past week.  He has recently completed a prednisone taper.  He has shortness of breath at baseline.  He is currently on amlodipine which could be the triggering factor.  Anxiety  Patient reports worsening anxiety in the setting of COPD and shortness of breath as well as due to his ongoing issues with skin cancer.  He was previously on benzodiazepines chronically.  He is requesting Ativan to be used as needed.  He states that this improves his shortness of breath as well.  COPD  Continues to struggle with shortness of breath and wheezing. Worse at night.  Recent prednisone taper.  He is currently only wearing his oxygen with exertion and at night.  He is hypoxic today.  Social Hx   Social History   Social History  . Marital status: Married    Spouse name: N/A  . Number of children: N/A  . Years of education: N/A   Occupational History  . retired    Social History Main Topics  . Smoking status: Current Every Day Smoker    Packs/day: 0.50    Years: 50.00    Types: Cigarettes  . Smokeless tobacco: Never Used     Comment: Currently smoking 4 cigarettes daily  . Alcohol use No  . Drug use: Yes    Types: Marijuana     Comment: Occasional marijuana use  . Sexual activity: Yes   Other Topics Concern  . None   Social History Narrative   Lives with family    Review of Systems  Respiratory: Positive for cough, shortness of breath and wheezing.   Cardiovascular: Positive for leg swelling.  Psychiatric/Behavioral: The patient is nervous/anxious.    Objective:  BP 138/82 (BP Location: Left Arm, Patient Position: Sitting, Cuff  Size: Normal)   Pulse (!) 107   Temp 98.3 F (36.8 C) (Oral)   Resp 14   Wt 194 lb 6.4 oz (88.2 kg)   SpO2 (!) 87%   BMI 26.37 kg/m   BP/Weight 10/13/2016 07/22/2016 9/67/8938  Systolic BP 101 751 025  Diastolic BP 82 72 82  Wt. (Lbs) 194.4 188 188.4  BMI 26.37 25.5 25.55   Physical Exam  Constitutional:  Chronically ill-appearing male in no acute distress.  Cardiovascular: Regular rhythm.   Tachycardic. 1-2+ bilateral lower extremity edema.  Pulmonary/Chest: Effort normal.  No wheezing or rales.   Neurological: He is alert.  Psychiatric:  Flat affect. Anxious.  Vitals reviewed.  Lab Results  Component Value Date   WBC 8.2 07/22/2016   HGB 15.7 07/22/2016   HCT 46.3 07/22/2016   PLT 183 07/22/2016   GLUCOSE 161 (H) 07/22/2016   CHOL 99 11/15/2015   TRIG 48.0 11/15/2015   HDL 37.40 (L) 11/15/2015   LDLCALC 52 11/15/2015   ALT 10 (L) 07/22/2016   AST 16 07/22/2016   NA 138 07/22/2016   K 3.6 07/22/2016   CL 102 07/22/2016   CREATININE 1.34 (H) 07/22/2016   BUN 14 07/22/2016   CO2 27 07/22/2016   PSA 0.71 05/17/2015   INR 0.99 05/04/2015   HGBA1C 5.8 05/04/2015    Assessment & Plan:   Problem List Items Addressed This Visit  Respiratory   COPD (chronic obstructive pulmonary disease) ([place])    Patient hypoxic today. I advised him to wear oxygen continuously as this is needed to keep his oxygen saturation up. Patient does not want to do this. Will arrange follow-up with pulmonology.        Other   Anxiety state    Worsening. Ativan as needed. Advised against frequent use.      Relevant Medications   LORazepam (ATIVAN) 0.5 MG tablet   Lower extremity edema    New problem. Stopping amlodipine. Starting hydralazine in this place for hypertension.         Meds ordered this encounter  Medications  . hydrALAZINE (APRESOLINE) 10 MG tablet    Sig: Take 0.5 tablets (5 mg total) by mouth 3 (three) times daily.    Dispense:  135 tablet    Refill:   0  . LORazepam (ATIVAN) 0.5 MG tablet    Sig: Take 1 tablet (0.5 mg total) by mouth 2 (two) times daily as needed for anxiety.    Dispense:  30 tablet    Refill:  1   Follow-up: 2 weeks for BP check  [place]

## 2016-10-13 NOTE — Progress Notes (Signed)
Pre-visit discussion using our clinic review tool. No additional management support is needed unless otherwise documented below in the visit note.  

## 2016-10-13 NOTE — Assessment & Plan Note (Signed)
Patient hypoxic today. I advised him to wear oxygen continuously as this is needed to keep his oxygen saturation up. Patient does not want to do this. Will arrange follow-up with pulmonology.

## 2016-10-21 ENCOUNTER — Encounter: Payer: Self-pay | Admitting: Family Medicine

## 2016-10-22 DIAGNOSIS — J449 Chronic obstructive pulmonary disease, unspecified: Secondary | ICD-10-CM | POA: Diagnosis not present

## 2016-10-22 DIAGNOSIS — J439 Emphysema, unspecified: Secondary | ICD-10-CM | POA: Diagnosis not present

## 2016-10-23 ENCOUNTER — Telehealth: Payer: Self-pay | Admitting: Family Medicine

## 2016-10-23 NOTE — Telephone Encounter (Signed)
Pt needs a order for Echo, also need lab test (BNP).  Please advise?   Call pt @ [phone]. Thank you!

## 2016-10-24 ENCOUNTER — Other Ambulatory Visit: Payer: Self-pay | Admitting: Family Medicine

## 2016-10-24 DIAGNOSIS — R600 Localized edema: Secondary | ICD-10-CM

## 2016-10-24 NOTE — Telephone Encounter (Signed)
Orders in 

## 2016-10-29 ENCOUNTER — Encounter: Payer: Self-pay | Admitting: Emergency Medicine

## 2016-10-29 ENCOUNTER — Emergency Department: Payer: PPO

## 2016-10-29 ENCOUNTER — Emergency Department
Admission: EM | Admit: 2016-10-29 | Discharge: 2016-10-29 | Discharge status: Against Medical Advice | Payer: PPO | Attending: Emergency Medicine | Admitting: Emergency Medicine

## 2016-10-29 ENCOUNTER — Encounter: Payer: Self-pay | Admitting: Family Medicine

## 2016-10-29 DIAGNOSIS — R0602 Shortness of breath: Secondary | ICD-10-CM | POA: Insufficient documentation

## 2016-10-29 DIAGNOSIS — Z5321 Procedure and treatment not carried out due to patient leaving prior to being seen by health care provider: Secondary | ICD-10-CM | POA: Diagnosis not present

## 2016-10-29 DIAGNOSIS — J9611 Chronic respiratory failure with hypoxia: Secondary | ICD-10-CM | POA: Diagnosis not present

## 2016-10-29 DIAGNOSIS — J449 Chronic obstructive pulmonary disease, unspecified: Secondary | ICD-10-CM | POA: Diagnosis not present

## 2016-10-29 LAB — BASIC METABOLIC PANEL
Anion gap: 8 (ref 5–15)
BUN: 14 mg/dL (ref 6–20)
CO2: 29 mmol/L (ref 22–32)
CREATININE: 1.24 mg/dL (ref 0.61–1.24)
Calcium: 8.6 mg/dL — ABNORMAL LOW (ref 8.9–10.3)
Chloride: 103 mmol/L (ref 101–111)
GFR calc Af Amer: >60 mL/min (ref >60)
GFR, EST NON AFRICAN AMERICAN: 58 mL/min — AB (ref >60)
GLUCOSE: 193 mg/dL — AB (ref 65–99)
Potassium: 4 mmol/L (ref 3.5–5.1)
SODIUM: 140 mmol/L (ref 135–145)

## 2016-10-29 LAB — CBC
HCT: 42.9 % (ref 40.0–52.0)
Hemoglobin: 14.1 g/dL (ref 13.0–18.0)
MCH: 31.4 pg (ref 26.0–34.0)
MCHC: 32.8 g/dL (ref 32.0–36.0)
MCV: 95.7 fL (ref 80.0–100.0)
PLATELETS: 183 10*3/uL (ref 150–440)
RBC: 4.48 MIL/uL (ref 4.40–5.90)
RDW: 16.5 % — AB (ref 11.5–14.5)
WBC: 7.5 10*3/uL (ref 3.8–10.6)

## 2016-10-29 NOTE — ED Notes (Signed)
Pt was placed in Wheel chair with sats assessed and noted to be 76% on RA. Pt reports that he is on home O2 continous and has been without O2 on the 20 min ride here and the walk to the ED, pt was placed on 3L [place] and sats quickly came up to 91%.

## 2016-10-29 NOTE — ED Triage Notes (Addendum)
Pt arrived via POV from home with c/o bilateral ankle joint pain and shortness of breath. Pt is ordered to wear oxygen 2L with activity, however, PCP told pt he needs to probably start wearing it continuously but needs to see pulmonology.  Pt states the pain in his ankles is worse with activity for the past 2 weeks. Pt states he is using albuterol nebs 3-4x per day.  Pt last used neb tx around 1am.  Pt states he is trying to get in touch with pulmonology but unable to reach them.   Pt states his oxygen level this morning on room air when he woke up was 64%.  Pt has hx of COPD.

## 2016-10-29 NOTE — ED Notes (Signed)
Back to assess pt in the lobby, pt was not found.

## 2016-10-29 NOTE — ED Notes (Signed)
Pt called from lobby without answer.

## 2016-10-30 ENCOUNTER — Other Ambulatory Visit: Payer: Self-pay | Admitting: Family Medicine

## 2016-10-30 MED ORDER — LEVOFLOXACIN 500 MG PO TABS: 500.0000 mg | ORAL_TABLET | Freq: Every day | ORAL | 0 refills | Status: DC

## 2016-10-31 ENCOUNTER — Encounter: Payer: Self-pay | Admitting: Family Medicine

## 2016-11-02 ENCOUNTER — Ambulatory Visit
Admission: RE | Admit: 2016-11-02 | Discharge: 2016-11-02 | Disposition: A | Discharge status: Home / Self Care | Payer: PPO | Source: Ambulatory Visit | Attending: Family Medicine | Admitting: Family Medicine

## 2016-11-02 ENCOUNTER — Other Ambulatory Visit: Payer: Self-pay | Admitting: Family Medicine

## 2016-11-02 DIAGNOSIS — I420 Dilated cardiomyopathy: Secondary | ICD-10-CM | POA: Diagnosis not present

## 2016-11-02 DIAGNOSIS — I5030 Unspecified diastolic (congestive) heart failure: Secondary | ICD-10-CM | POA: Diagnosis not present

## 2016-11-02 DIAGNOSIS — R600 Localized edema: Secondary | ICD-10-CM

## 2016-11-02 DIAGNOSIS — I517 Cardiomegaly: Secondary | ICD-10-CM | POA: Insufficient documentation

## 2016-11-02 DIAGNOSIS — I5041 Acute combined systolic (congestive) and diastolic (congestive) heart failure: Secondary | ICD-10-CM

## 2016-11-02 MED ORDER — CLONAZEPAM 0.5 MG PO TABS: 0.5000 mg | ORAL_TABLET | Freq: Two times a day (BID) | ORAL | 0 refills | Status: DC | PRN

## 2016-11-02 NOTE — Progress Notes (Signed)
*  PRELIMINARY RESULTS* Echocardiogram 2D Echocardiogram has been performed.  Kevin Gutierrez 11/02/2016, 10:20 AM

## 2016-11-03 ENCOUNTER — Encounter: Payer: Self-pay | Admitting: Family Medicine

## 2016-11-03 NOTE — Progress Notes (Deleted)
Cardiology Office Note  Date:  11/03/2016   ID:  Kevin Gutierrez, DOB [birth year], MRN [id number]  PCP:  Coral Spikes, DO   No chief complaint on file.   HPI:   COPD, 43 yr smoking Leg edema, amlodipine stopped 10/13/16 HTN chronic respiratory failure,  CKD,  nephrolithiasis,  anxiety  CT chest 05/2015 Renal CT 06/2016  Echo 11/02/2016 Left ventricle: The cavity size was normal. There was mild   concentric hypertrophy. Systolic function was moderately reduced.   The estimated ejection fraction was in the range of 35% to 40%.   Diffuse hypokinesis. Doppler parameters are consistent with   abnormal left ventricular relaxation (grade 1 diastolic   dysfunction). - Right atrium: The atrium was mildly dilated. - Pulmonary arteries: Systolic pressure could not be accurately   estimated. - Inferior vena cava: The vessel was dilated. Respirophasic changes   in dimension were absent, consistent with elevated central venous   pressure. - Pericardium, extracardiac: Possible left pleural effusion.  PMH:   has a past medical history of Chicken pox; COPD (chronic obstructive pulmonary disease) ([place]); History of kidney stones (05/17/2015); History of stomach ulcers (05/17/2015); Hypertension; Kidney stones; Nephrolithiasis; Peptic ulcer disease; and Vitamin D deficiency.  PSH:    Past Surgical History:  Procedure Laterality Date  . LITHOTRIPSY    . SPINAL CORD DECOMPRESSION  03/08/1994    Current Outpatient Prescriptions  Medication Sig Dispense Refill  . acetaminophen (TYLENOL) 325 MG tablet Take 500 mg by mouth every 6 (six) hours as needed for moderate pain, fever or headache.     . albuterol (PROVENTIL) (2.5 MG/3ML) 0.083% nebulizer solution Take 3 mLs (2.5 mg total) by nebulization every 3 (three) hours as needed for wheezing or shortness of breath. 75 mL 12  . albuterol (VENTOLIN HFA) 108 (90 Base) MCG/ACT inhaler Inhale 2 puffs into the lungs every 4 (four) hours as needed for  wheezing or shortness of breath. 1 Inhaler 3  . aspirin EC 81 MG tablet Take 1 tablet (81 mg total) by mouth daily.    Marland Kitchen atorvastatin (LIPITOR) 20 MG tablet Take 1 tablet (20 mg total) by mouth daily. 90 tablet 3  . Cholecalciferol (VITAMIN D3 PO) Take 2,000 mg by mouth daily.     . clonazePAM (KLONOPIN) 0.5 MG tablet Take 1 tablet (0.5 mg total) by mouth 2 (two) times daily as needed for anxiety. 60 tablet 0  . fluticasone furoate-vilanterol (BREO ELLIPTA) 200-25 MCG/INH AEPB Inhale 1 puff into the lungs daily. 1 each 0  . hydrALAZINE (APRESOLINE) 10 MG tablet Take 0.5 tablets (5 mg total) by mouth 3 (three) times daily. 135 tablet 0  . levofloxacin (LEVAQUIN) 500 MG tablet Take 1 tablet (500 mg total) by mouth daily. 10 tablet 0  . pantoprazole (PROTONIX) 40 MG tablet Take 1 tablet (40 mg total) by mouth daily. 90 tablet 0  . tiotropium (SPIRIVA) 18 MCG inhalation capsule Place 1 capsule (18 mcg total) into inhaler and inhale daily. 30 capsule 5  . varenicline (CHANTIX STARTING MONTH PAK) 0.5 MG X 11 & 1 MG X 42 tablet 0.5 mg by mouth once daily for 3 days, then increase to 0.5 mg twice daily for 4 days, then increase to 1 mg tablet twice daily. 53 tablet 0   No current facility-administered medications for this visit.      Allergies:   Patient has no known allergies.   Social History:  The patient  reports that he has been smoking Cigarettes.  He has a 25.00 pack-year smoking history. He has never used smokeless tobacco. He reports that he uses drugs, including Marijuana. He reports that he does not drink alcohol.   Family History:   family history includes Diabetes Mellitus II in his brother; Heart disease in his brother and mother; Hyperlipidemia in his brother; Hypertension in his brother; Lung cancer in his brother; Stroke in his father.    Review of Systems: ROS   PHYSICAL EXAM: VS:  There were no vitals taken for this visit. , BMI There is no height or weight on file to calculate  BMI. GEN: Well nourished, well developed, in no acute distress HEENT: normal Neck: no JVD, carotid bruits, or masses Cardiac: RRR; no murmurs, rubs, or gallops,no edema  Respiratory:  clear to auscultation bilaterally, normal work of breathing GI: soft, nontender, nondistended, + BS MS: no deformity or atrophy Skin: warm and dry, no rash Neuro:  Strength and sensation are intact Psych: euthymic mood, full affect    Recent Labs: 07/22/2016: ALT 10 10/29/2016: BUN 14; Creatinine, Ser 1.24; Hemoglobin 14.1; Platelets 183; Potassium 4.0; Sodium 140    Lipid Panel Lab Results  Component Value Date   CHOL 99 11/15/2015   HDL 37.40 (L) 11/15/2015   LDLCALC 52 11/15/2015   TRIG 48.0 11/15/2015      Wt Readings from Last 3 Encounters:  10/13/16 194 lb 6.4 oz (88.2 kg)  07/22/16 188 lb (85.3 kg)  07/14/16 188 lb 6.4 oz (85.5 kg)       ASSESSMENT AND PLAN:  No diagnosis found.   Disposition:   F/U  6 months  No orders of the defined types were placed in this encounter.    Signed, Esmond Plants, M.D., Ph.D. 11/03/2016  [place], [place]

## 2016-11-04 ENCOUNTER — Other Ambulatory Visit: Payer: Self-pay

## 2016-11-04 ENCOUNTER — Encounter (HOSPITAL_COMMUNITY): Payer: Self-pay

## 2016-11-04 DIAGNOSIS — J9601 Acute respiratory failure with hypoxia: Secondary | ICD-10-CM | POA: Diagnosis not present

## 2016-11-04 DIAGNOSIS — Z8711 Personal history of peptic ulcer disease: Secondary | ICD-10-CM

## 2016-11-04 DIAGNOSIS — E1122 Type 2 diabetes mellitus with diabetic chronic kidney disease: Secondary | ICD-10-CM | POA: Diagnosis present

## 2016-11-04 DIAGNOSIS — E785 Hyperlipidemia, unspecified: Secondary | ICD-10-CM | POA: Diagnosis present

## 2016-11-04 DIAGNOSIS — R042 Hemoptysis: Secondary | ICD-10-CM | POA: Diagnosis present

## 2016-11-04 DIAGNOSIS — I25118 Atherosclerotic heart disease of native coronary artery with other forms of angina pectoris: Secondary | ICD-10-CM | POA: Diagnosis not present

## 2016-11-04 DIAGNOSIS — Z9889 Other specified postprocedural states: Secondary | ICD-10-CM | POA: Diagnosis not present

## 2016-11-04 DIAGNOSIS — I5043 Acute on chronic combined systolic (congestive) and diastolic (congestive) heart failure: Secondary | ICD-10-CM | POA: Diagnosis present

## 2016-11-04 DIAGNOSIS — N200 Calculus of kidney: Secondary | ICD-10-CM | POA: Diagnosis present

## 2016-11-04 DIAGNOSIS — J9621 Acute and chronic respiratory failure with hypoxia: Secondary | ICD-10-CM | POA: Diagnosis present

## 2016-11-04 DIAGNOSIS — F419 Anxiety disorder, unspecified: Secondary | ICD-10-CM | POA: Diagnosis present

## 2016-11-04 DIAGNOSIS — N183 Chronic kidney disease, stage 3 (moderate): Secondary | ICD-10-CM | POA: Diagnosis present

## 2016-11-04 DIAGNOSIS — I5023 Acute on chronic systolic (congestive) heart failure: Secondary | ICD-10-CM | POA: Diagnosis not present

## 2016-11-04 DIAGNOSIS — I110 Hypertensive heart disease with heart failure: Secondary | ICD-10-CM | POA: Diagnosis not present

## 2016-11-04 DIAGNOSIS — I255 Ischemic cardiomyopathy: Secondary | ICD-10-CM | POA: Diagnosis present

## 2016-11-04 DIAGNOSIS — Z9981 Dependence on supplemental oxygen: Secondary | ICD-10-CM | POA: Diagnosis not present

## 2016-11-04 DIAGNOSIS — Z7982 Long term (current) use of aspirin: Secondary | ICD-10-CM | POA: Diagnosis not present

## 2016-11-04 DIAGNOSIS — E1165 Type 2 diabetes mellitus with hyperglycemia: Secondary | ICD-10-CM | POA: Diagnosis present

## 2016-11-04 DIAGNOSIS — Z801 Family history of malignant neoplasm of trachea, bronchus and lung: Secondary | ICD-10-CM

## 2016-11-04 DIAGNOSIS — J440 Chronic obstructive pulmonary disease with acute lower respiratory infection: Secondary | ICD-10-CM | POA: Diagnosis present

## 2016-11-04 DIAGNOSIS — I200 Unstable angina: Secondary | ICD-10-CM | POA: Diagnosis not present

## 2016-11-04 DIAGNOSIS — Z823 Family history of stroke: Secondary | ICD-10-CM

## 2016-11-04 DIAGNOSIS — J9811 Atelectasis: Secondary | ICD-10-CM | POA: Diagnosis present

## 2016-11-04 DIAGNOSIS — N289 Disorder of kidney and ureter, unspecified: Secondary | ICD-10-CM | POA: Diagnosis present

## 2016-11-04 DIAGNOSIS — I130 Hypertensive heart and chronic kidney disease with heart failure and stage 1 through stage 4 chronic kidney disease, or unspecified chronic kidney disease: Secondary | ICD-10-CM | POA: Diagnosis present

## 2016-11-04 DIAGNOSIS — E875 Hyperkalemia: Secondary | ICD-10-CM | POA: Diagnosis not present

## 2016-11-04 DIAGNOSIS — J9602 Acute respiratory failure with hypercapnia: Secondary | ICD-10-CM | POA: Diagnosis not present

## 2016-11-04 DIAGNOSIS — Z85828 Personal history of other malignant neoplasm of skin: Secondary | ICD-10-CM

## 2016-11-04 DIAGNOSIS — I2510 Atherosclerotic heart disease of native coronary artery without angina pectoris: Secondary | ICD-10-CM | POA: Diagnosis present

## 2016-11-04 DIAGNOSIS — J181 Lobar pneumonia, unspecified organism: Secondary | ICD-10-CM | POA: Diagnosis not present

## 2016-11-04 DIAGNOSIS — R600 Localized edema: Secondary | ICD-10-CM | POA: Diagnosis not present

## 2016-11-04 DIAGNOSIS — F17210 Nicotine dependence, cigarettes, uncomplicated: Secondary | ICD-10-CM | POA: Diagnosis present

## 2016-11-04 DIAGNOSIS — I10 Essential (primary) hypertension: Secondary | ICD-10-CM | POA: Diagnosis not present

## 2016-11-04 DIAGNOSIS — D899 Disorder involving the immune mechanism, unspecified: Secondary | ICD-10-CM | POA: Diagnosis present

## 2016-11-04 DIAGNOSIS — C3490 Malignant neoplasm of unspecified part of unspecified bronchus or lung: Secondary | ICD-10-CM | POA: Diagnosis not present

## 2016-11-04 DIAGNOSIS — Z66 Do not resuscitate: Secondary | ICD-10-CM | POA: Diagnosis present

## 2016-11-04 DIAGNOSIS — Z79899 Other long term (current) drug therapy: Secondary | ICD-10-CM

## 2016-11-04 DIAGNOSIS — J189 Pneumonia, unspecified organism: Secondary | ICD-10-CM | POA: Diagnosis present

## 2016-11-04 DIAGNOSIS — I5041 Acute combined systolic (congestive) and diastolic (congestive) heart failure: Secondary | ICD-10-CM | POA: Diagnosis not present

## 2016-11-04 DIAGNOSIS — C3412 Malignant neoplasm of upper lobe, left bronchus or lung: Secondary | ICD-10-CM | POA: Diagnosis present

## 2016-11-04 DIAGNOSIS — J9612 Chronic respiratory failure with hypercapnia: Secondary | ICD-10-CM | POA: Diagnosis not present

## 2016-11-04 DIAGNOSIS — Z8249 Family history of ischemic heart disease and other diseases of the circulatory system: Secondary | ICD-10-CM

## 2016-11-04 DIAGNOSIS — J90 Pleural effusion, not elsewhere classified: Secondary | ICD-10-CM | POA: Diagnosis not present

## 2016-11-04 DIAGNOSIS — Z538 Procedure and treatment not carried out for other reasons: Secondary | ICD-10-CM | POA: Diagnosis not present

## 2016-11-04 DIAGNOSIS — R609 Edema, unspecified: Secondary | ICD-10-CM | POA: Diagnosis not present

## 2016-11-04 DIAGNOSIS — Z8701 Personal history of pneumonia (recurrent): Secondary | ICD-10-CM

## 2016-11-04 DIAGNOSIS — R739 Hyperglycemia, unspecified: Secondary | ICD-10-CM | POA: Diagnosis not present

## 2016-11-04 DIAGNOSIS — J850 Gangrene and necrosis of lung: Secondary | ICD-10-CM | POA: Diagnosis not present

## 2016-11-04 DIAGNOSIS — J441 Chronic obstructive pulmonary disease with (acute) exacerbation: Secondary | ICD-10-CM | POA: Diagnosis present

## 2016-11-04 DIAGNOSIS — J9611 Chronic respiratory failure with hypoxia: Secondary | ICD-10-CM | POA: Diagnosis not present

## 2016-11-04 DIAGNOSIS — J9600 Acute respiratory failure, unspecified whether with hypoxia or hypercapnia: Secondary | ICD-10-CM | POA: Diagnosis not present

## 2016-11-04 DIAGNOSIS — T380X5A Adverse effect of glucocorticoids and synthetic analogues, initial encounter: Secondary | ICD-10-CM | POA: Diagnosis not present

## 2016-11-04 DIAGNOSIS — R0602 Shortness of breath: Secondary | ICD-10-CM | POA: Diagnosis not present

## 2016-11-04 DIAGNOSIS — Z8349 Family history of other endocrine, nutritional and metabolic diseases: Secondary | ICD-10-CM

## 2016-11-04 DIAGNOSIS — Q631 Lobulated, fused and horseshoe kidney: Secondary | ICD-10-CM

## 2016-11-04 DIAGNOSIS — R846 Abnormal cytological findings in specimens from respiratory organs and thorax: Secondary | ICD-10-CM | POA: Diagnosis not present

## 2016-11-04 DIAGNOSIS — Z833 Family history of diabetes mellitus: Secondary | ICD-10-CM

## 2016-11-04 DIAGNOSIS — R918 Other nonspecific abnormal finding of lung field: Secondary | ICD-10-CM | POA: Diagnosis not present

## 2016-11-04 NOTE — ED Triage Notes (Signed)
Pt comes with c/o of SOB that has been going on for the past few months, pt states he had an echo on Thursday and was told there were blockages. States 91% on 2L Hx of COPD and recently diagnosed with Pneumonia, still on antibiotics, denies fevers. Swelling to bilateral feet for the past two week.

## 2016-11-05 ENCOUNTER — Encounter: Payer: Self-pay | Admitting: Family Medicine

## 2016-11-05 ENCOUNTER — Emergency Department (HOSPITAL_COMMUNITY): Payer: PPO

## 2016-11-05 ENCOUNTER — Inpatient Hospital Stay (HOSPITAL_COMMUNITY): Payer: PPO

## 2016-11-05 ENCOUNTER — Encounter (HOSPITAL_COMMUNITY): Payer: Self-pay | Admitting: Internal Medicine

## 2016-11-05 ENCOUNTER — Inpatient Hospital Stay (HOSPITAL_COMMUNITY)
Admission: EM | Admit: 2016-11-05 | Discharge: 2016-11-11 | DRG: 166 | Disposition: A | Discharge status: Home / Self Care | Payer: PPO | Attending: Family Medicine | Admitting: Family Medicine

## 2016-11-05 DIAGNOSIS — J9610 Chronic respiratory failure, unspecified whether with hypoxia or hypercapnia: Secondary | ICD-10-CM | POA: Diagnosis present

## 2016-11-05 DIAGNOSIS — J181 Lobar pneumonia, unspecified organism: Secondary | ICD-10-CM | POA: Diagnosis not present

## 2016-11-05 DIAGNOSIS — Z9889 Other specified postprocedural states: Secondary | ICD-10-CM | POA: Diagnosis not present

## 2016-11-05 DIAGNOSIS — J189 Pneumonia, unspecified organism: Secondary | ICD-10-CM | POA: Diagnosis present

## 2016-11-05 DIAGNOSIS — R739 Hyperglycemia, unspecified: Secondary | ICD-10-CM

## 2016-11-05 DIAGNOSIS — J90 Pleural effusion, not elsewhere classified: Secondary | ICD-10-CM | POA: Diagnosis not present

## 2016-11-05 DIAGNOSIS — N200 Calculus of kidney: Secondary | ICD-10-CM | POA: Diagnosis present

## 2016-11-05 DIAGNOSIS — J9811 Atelectasis: Secondary | ICD-10-CM

## 2016-11-05 DIAGNOSIS — E1122 Type 2 diabetes mellitus with diabetic chronic kidney disease: Secondary | ICD-10-CM | POA: Diagnosis present

## 2016-11-05 DIAGNOSIS — I10 Essential (primary) hypertension: Secondary | ICD-10-CM | POA: Diagnosis present

## 2016-11-05 DIAGNOSIS — N183 Chronic kidney disease, stage 3 unspecified: Secondary | ICD-10-CM | POA: Diagnosis present

## 2016-11-05 DIAGNOSIS — E875 Hyperkalemia: Secondary | ICD-10-CM | POA: Diagnosis not present

## 2016-11-05 DIAGNOSIS — I5041 Acute combined systolic (congestive) and diastolic (congestive) heart failure: Secondary | ICD-10-CM | POA: Diagnosis not present

## 2016-11-05 DIAGNOSIS — J9600 Acute respiratory failure, unspecified whether with hypoxia or hypercapnia: Secondary | ICD-10-CM | POA: Diagnosis present

## 2016-11-05 DIAGNOSIS — F419 Anxiety disorder, unspecified: Secondary | ICD-10-CM | POA: Diagnosis present

## 2016-11-05 DIAGNOSIS — R600 Localized edema: Secondary | ICD-10-CM | POA: Diagnosis present

## 2016-11-05 DIAGNOSIS — E785 Hyperlipidemia, unspecified: Secondary | ICD-10-CM | POA: Diagnosis present

## 2016-11-05 DIAGNOSIS — J9601 Acute respiratory failure with hypoxia: Secondary | ICD-10-CM | POA: Diagnosis not present

## 2016-11-05 DIAGNOSIS — I25118 Atherosclerotic heart disease of native coronary artery with other forms of angina pectoris: Secondary | ICD-10-CM | POA: Diagnosis not present

## 2016-11-05 DIAGNOSIS — R609 Edema, unspecified: Secondary | ICD-10-CM

## 2016-11-05 DIAGNOSIS — C3412 Malignant neoplasm of upper lobe, left bronchus or lung: Secondary | ICD-10-CM | POA: Diagnosis present

## 2016-11-05 DIAGNOSIS — J9611 Chronic respiratory failure with hypoxia: Secondary | ICD-10-CM | POA: Diagnosis not present

## 2016-11-05 DIAGNOSIS — R918 Other nonspecific abnormal finding of lung field: Secondary | ICD-10-CM | POA: Diagnosis not present

## 2016-11-05 DIAGNOSIS — I255 Ischemic cardiomyopathy: Secondary | ICD-10-CM | POA: Diagnosis present

## 2016-11-05 DIAGNOSIS — R042 Hemoptysis: Secondary | ICD-10-CM | POA: Diagnosis present

## 2016-11-05 DIAGNOSIS — Q631 Lobulated, fused and horseshoe kidney: Secondary | ICD-10-CM | POA: Diagnosis not present

## 2016-11-05 DIAGNOSIS — I5043 Acute on chronic combined systolic (congestive) and diastolic (congestive) heart failure: Secondary | ICD-10-CM | POA: Diagnosis present

## 2016-11-05 DIAGNOSIS — J9621 Acute and chronic respiratory failure with hypoxia: Secondary | ICD-10-CM | POA: Diagnosis present

## 2016-11-05 DIAGNOSIS — I5023 Acute on chronic systolic (congestive) heart failure: Secondary | ICD-10-CM | POA: Diagnosis not present

## 2016-11-05 DIAGNOSIS — J9602 Acute respiratory failure with hypercapnia: Secondary | ICD-10-CM | POA: Diagnosis not present

## 2016-11-05 DIAGNOSIS — J441 Chronic obstructive pulmonary disease with (acute) exacerbation: Secondary | ICD-10-CM | POA: Diagnosis present

## 2016-11-05 DIAGNOSIS — I509 Heart failure, unspecified: Secondary | ICD-10-CM

## 2016-11-05 DIAGNOSIS — Z7982 Long term (current) use of aspirin: Secondary | ICD-10-CM | POA: Diagnosis not present

## 2016-11-05 DIAGNOSIS — Z9981 Dependence on supplemental oxygen: Secondary | ICD-10-CM | POA: Diagnosis not present

## 2016-11-05 DIAGNOSIS — E1165 Type 2 diabetes mellitus with hyperglycemia: Secondary | ICD-10-CM | POA: Diagnosis present

## 2016-11-05 DIAGNOSIS — F17210 Nicotine dependence, cigarettes, uncomplicated: Secondary | ICD-10-CM | POA: Diagnosis present

## 2016-11-05 DIAGNOSIS — J440 Chronic obstructive pulmonary disease with acute lower respiratory infection: Secondary | ICD-10-CM | POA: Diagnosis present

## 2016-11-05 DIAGNOSIS — J9612 Chronic respiratory failure with hypercapnia: Secondary | ICD-10-CM | POA: Diagnosis not present

## 2016-11-05 DIAGNOSIS — E119 Type 2 diabetes mellitus without complications: Secondary | ICD-10-CM

## 2016-11-05 DIAGNOSIS — C3490 Malignant neoplasm of unspecified part of unspecified bronchus or lung: Secondary | ICD-10-CM | POA: Diagnosis not present

## 2016-11-05 DIAGNOSIS — Z66 Do not resuscitate: Secondary | ICD-10-CM | POA: Diagnosis present

## 2016-11-05 DIAGNOSIS — D899 Disorder involving the immune mechanism, unspecified: Secondary | ICD-10-CM | POA: Diagnosis present

## 2016-11-05 DIAGNOSIS — I2510 Atherosclerotic heart disease of native coronary artery without angina pectoris: Secondary | ICD-10-CM

## 2016-11-05 DIAGNOSIS — N289 Disorder of kidney and ureter, unspecified: Secondary | ICD-10-CM

## 2016-11-05 DIAGNOSIS — I130 Hypertensive heart and chronic kidney disease with heart failure and stage 1 through stage 4 chronic kidney disease, or unspecified chronic kidney disease: Secondary | ICD-10-CM | POA: Diagnosis present

## 2016-11-05 LAB — BASIC METABOLIC PANEL
ANION GAP: 6 (ref 5–15)
BUN: 11 mg/dL (ref 6–20)
CALCIUM: 8.6 mg/dL — AB (ref 8.9–10.3)
CO2: 28 mmol/L (ref 22–32)
Chloride: 103 mmol/L (ref 101–111)
Creatinine, Ser: 1.42 mg/dL — ABNORMAL HIGH (ref 0.61–1.24)
GFR calc Af Amer: 58 mL/min — ABNORMAL LOW (ref >60)
GFR, EST NON AFRICAN AMERICAN: 50 mL/min — AB (ref >60)
GLUCOSE: 178 mg/dL — AB (ref 65–99)
Potassium: 3.9 mmol/L (ref 3.5–5.1)
Sodium: 137 mmol/L (ref 135–145)

## 2016-11-05 LAB — DIFFERENTIAL
BASOS ABS: 0 10*3/uL (ref 0.0–0.1)
Basophils Relative: 0 %
EOS PCT: 1 %
Eosinophils Absolute: 0.1 10*3/uL (ref 0.0–0.7)
LYMPHS ABS: 1.7 10*3/uL (ref 0.7–4.0)
LYMPHS PCT: 19 %
MONOS PCT: 9 %
Monocytes Absolute: 0.8 10*3/uL (ref 0.1–1.0)
NEUTROS PCT: 71 %
Neutro Abs: 6.2 10*3/uL (ref 1.7–7.7)

## 2016-11-05 LAB — TROPONIN I
TROPONIN I: 0.03 ng/mL — AB (ref <0.03)
Troponin I: 0.04 ng/mL (ref <0.03)
Troponin I: 0.03 ng/mL

## 2016-11-05 LAB — BASIC METABOLIC PANEL WITH GFR
Anion gap: 9 (ref 5–15)
BUN: 12 mg/dL (ref 6–20)
CO2: 28 mmol/L (ref 22–32)
Calcium: 8.7 mg/dL — ABNORMAL LOW (ref 8.9–10.3)
Chloride: 103 mmol/L (ref 101–111)
Creatinine, Ser: 1.4 mg/dL — ABNORMAL HIGH (ref 0.61–1.24)
GFR calc Af Amer: 59 mL/min — ABNORMAL LOW
GFR calc non Af Amer: 50 mL/min — ABNORMAL LOW
Glucose, Bld: 126 mg/dL — ABNORMAL HIGH (ref 65–99)
Potassium: 4.1 mmol/L (ref 3.5–5.1)
Sodium: 140 mmol/L (ref 135–145)

## 2016-11-05 LAB — CBC
HEMATOCRIT: 44.8 % (ref 39.0–52.0)
Hemoglobin: 14 g/dL (ref 13.0–17.0)
MCH: 31.3 pg (ref 26.0–34.0)
MCHC: 31.3 g/dL (ref 30.0–36.0)
MCV: 100 fL (ref 78.0–100.0)
PLATELETS: 181 10*3/uL (ref 150–400)
RBC: 4.48 MIL/uL (ref 4.22–5.81)
RDW: 16.8 % — AB (ref 11.5–15.5)
WBC: 8.8 10*3/uL (ref 4.0–10.5)

## 2016-11-05 LAB — I-STAT TROPONIN, ED: Troponin i, poc: 0.03 ng/mL (ref 0.00–0.08)

## 2016-11-05 LAB — BRAIN NATRIURETIC PEPTIDE: B Natriuretic Peptide: 370.4 pg/mL — ABNORMAL HIGH (ref 0.0–100.0)

## 2016-11-05 MED ORDER — ASPIRIN EC 81 MG PO TBEC
81.0000 mg | DELAYED_RELEASE_TABLET | Freq: Every day | ORAL | Status: DC
  Administered 2016-11-05 – 2016-11-11 (×6): 81 mg via ORAL
  Filled 2016-11-05 (×7): qty 1

## 2016-11-05 MED ORDER — HYDRALAZINE HCL 10 MG PO TABS
10.0000 mg | ORAL_TABLET | Freq: Three times a day (TID) | ORAL | Status: DC
  Administered 2016-11-05 – 2016-11-06 (×4): 10 mg via ORAL
  Filled 2016-11-05 (×4): qty 1

## 2016-11-05 MED ORDER — HYDROCODONE-ACETAMINOPHEN 10-325 MG PO TABS
1.0000 | ORAL_TABLET | Freq: Four times a day (QID) | ORAL | Status: DC | PRN
  Administered 2016-11-06 – 2016-11-10 (×2): 1 via ORAL
  Filled 2016-11-05 (×2): qty 1

## 2016-11-05 MED ORDER — CLONAZEPAM 0.5 MG PO TABS
0.5000 mg | ORAL_TABLET | Freq: Two times a day (BID) | ORAL | Status: DC | PRN
  Administered 2016-11-06 – 2016-11-07 (×2): 0.5 mg via ORAL
  Filled 2016-11-05 (×3): qty 1

## 2016-11-05 MED ORDER — AMLODIPINE BESYLATE 5 MG PO TABS
5.0000 mg | ORAL_TABLET | Freq: Every day | ORAL | Status: DC
  Administered 2016-11-05: 5 mg via ORAL
  Filled 2016-11-05: qty 1

## 2016-11-05 MED ORDER — ALBUTEROL SULFATE (2.5 MG/3ML) 0.083% IN NEBU
3.0000 mL | INHALATION_SOLUTION | RESPIRATORY_TRACT | Status: DC | PRN
  Administered 2016-11-05: 3 mL via RESPIRATORY_TRACT
  Filled 2016-11-05: qty 3

## 2016-11-05 MED ORDER — METHYLPREDNISOLONE SODIUM SUCC 125 MG IJ SOLR
80.0000 mg | Freq: Three times a day (TID) | INTRAMUSCULAR | Status: DC
  Administered 2016-11-05 – 2016-11-06 (×4): 80 mg via INTRAVENOUS
  Filled 2016-11-05 (×4): qty 2

## 2016-11-05 MED ORDER — FUROSEMIDE 10 MG/ML IJ SOLN
40.0000 mg | Freq: Once | INTRAMUSCULAR | Status: AC
  Administered 2016-11-05: 40 mg via INTRAVENOUS
  Filled 2016-11-05: qty 4

## 2016-11-05 MED ORDER — SODIUM CHLORIDE 0.9% FLUSH: 3.0000 mL | Freq: Two times a day (BID) | INTRAVENOUS | Status: DC

## 2016-11-05 MED ORDER — FLUTICASONE FUROATE-VILANTEROL 200-25 MCG/INH IN AEPB
1.0000 | INHALATION_SPRAY | Freq: Every day | RESPIRATORY_TRACT | Status: DC
  Administered 2016-11-05 – 2016-11-11 (×6): 1 via RESPIRATORY_TRACT
  Filled 2016-11-05 (×2): qty 28

## 2016-11-05 MED ORDER — METOPROLOL TARTRATE 25 MG PO TABS
25.0000 mg | ORAL_TABLET | Freq: Two times a day (BID) | ORAL | Status: DC
  Administered 2016-11-05 – 2016-11-11 (×10): 25 mg via ORAL
  Filled 2016-11-05 (×12): qty 1

## 2016-11-05 MED ORDER — ATORVASTATIN CALCIUM 20 MG PO TABS
20.0000 mg | ORAL_TABLET | ORAL | Status: DC
  Administered 2016-11-05 – 2016-11-11 (×4): 20 mg via ORAL
  Filled 2016-11-05 (×5): qty 1

## 2016-11-05 MED ORDER — VANCOMYCIN HCL IN DEXTROSE 750-5 MG/150ML-% IV SOLN
750.0000 mg | Freq: Two times a day (BID) | INTRAVENOUS | Status: DC
Start: 2016-11-05 — End: 2016-11-05
  Filled 2016-11-05: qty 150

## 2016-11-05 MED ORDER — HEPARIN SODIUM (PORCINE) 5000 UNIT/ML IJ SOLN
5000.0000 [IU] | Freq: Three times a day (TID) | INTRAMUSCULAR | Status: DC
  Filled 2016-11-05 (×4): qty 1

## 2016-11-05 MED ORDER — PANTOPRAZOLE SODIUM 40 MG PO TBEC
40.0000 mg | DELAYED_RELEASE_TABLET | Freq: Every day | ORAL | Status: DC
  Administered 2016-11-05 – 2016-11-11 (×6): 40 mg via ORAL
  Filled 2016-11-05 (×6): qty 1

## 2016-11-05 MED ORDER — SODIUM CHLORIDE 0.9% FLUSH
3.0000 mL | Freq: Two times a day (BID) | INTRAVENOUS | Status: DC
  Administered 2016-11-06: 3 mL via INTRAVENOUS

## 2016-11-05 MED ORDER — TIOTROPIUM BROMIDE MONOHYDRATE 18 MCG IN CAPS
18.0000 ug | ORAL_CAPSULE | Freq: Every day | RESPIRATORY_TRACT | Status: DC
  Administered 2016-11-05 – 2016-11-11 (×6): 18 ug via RESPIRATORY_TRACT
  Filled 2016-11-05 (×2): qty 5

## 2016-11-05 MED ORDER — VANCOMYCIN HCL 10 G IV SOLR
1500.0000 mg | Freq: Once | INTRAVENOUS | Status: AC
  Administered 2016-11-05: 1500 mg via INTRAVENOUS
  Filled 2016-11-05: qty 1500

## 2016-11-05 MED ORDER — FUROSEMIDE 10 MG/ML IJ SOLN
40.0000 mg | Freq: Two times a day (BID) | INTRAMUSCULAR | Status: DC
  Administered 2016-11-05 – 2016-11-06 (×2): 40 mg via INTRAVENOUS
  Filled 2016-11-05 (×2): qty 4

## 2016-11-05 MED ORDER — IPRATROPIUM-ALBUTEROL 0.5-2.5 (3) MG/3ML IN SOLN
3.0000 mL | Freq: Once | RESPIRATORY_TRACT | Status: AC
  Administered 2016-11-05: 3 mL via RESPIRATORY_TRACT
  Filled 2016-11-05: qty 3

## 2016-11-05 MED ORDER — DEXTROSE 5 % IV SOLN
1.0000 g | INTRAVENOUS | Status: DC
  Administered 2016-11-05 – 2016-11-08 (×4): 1 g via INTRAVENOUS
  Filled 2016-11-05 (×4): qty 10

## 2016-11-05 MED ORDER — SODIUM CHLORIDE 0.9 % IV SOLN: 250.0000 mL | INTRAVENOUS | Status: DC | PRN

## 2016-11-05 MED ORDER — SODIUM CHLORIDE 0.9% FLUSH: 3.0000 mL | INTRAVENOUS | Status: DC | PRN

## 2016-11-05 MED ORDER — DEXTROSE 5 % IV SOLN
500.0000 mg | INTRAVENOUS | Status: DC
  Administered 2016-11-05: 500 mg via INTRAVENOUS
  Filled 2016-11-05 (×2): qty 500

## 2016-11-05 NOTE — ED Notes (Signed)
Sent label to main lab add on bnp and diff

## 2016-11-05 NOTE — Progress Notes (Signed)
Pharmacy Antibiotic Note  Kevin Gutierrez is a 66 y.o. male admitted on 11/05/2016 with pneumonia.  Pharmacy has been consulted for vancomycin dosing.  Already on ceftriaxone and azithromycin. Adding vancomycin. CrCl ~51ml/min  Plan: Give vancomycin 1.5g IV x 1, then start vancomycin 750mg  IV Q12h Continue azithromycin and ceftriaxone per MD Monitor clinical picture, renal function, VT prn F/U C&S, abx deescalation / LOT     Temp (24hrs), Avg:97.4 F (36.3 C), Min:97.4 F (36.3 C), Max:97.4 F (36.3 C)   Recent Labs Lab 10/29/16 1440 11/04/16 2357  WBC 7.5 8.8  CREATININE 1.24 1.42*    CrCl cannot be calculated (Unknown ideal weight.).    No Known Allergies  Antimicrobials this admission: Vancomycin 7/15 >>  Ceftriaxone 7/15 >>  Azithromycin 7/15 >>  Dose adjustments this admission: n/a  Microbiology results: n/a  Thank you for allowing pharmacy to be a part of this patient's care.  Reginia Naas 11/05/2016 5:36 AM

## 2016-11-05 NOTE — Progress Notes (Signed)
CRITICAL VALUE ALERT  Critical Value:  Troponin=0.03  Date & Time Notied:  11/05/2016 - 07:36  Provider Notified: Dr. Wynelle Cleveland  Orders Received/Actions taken: new labs

## 2016-11-05 NOTE — ED Notes (Signed)
Patient taken to CT.

## 2016-11-05 NOTE — Progress Notes (Signed)
[facility]   Have reviewed the chart and evaluated the patient.   Kevin Gutierrez is a 66 y/o with COPD on 2 L O2 at home, ongoing smoking, found to have EF of 35-40% on ECHO on 7/12, CKD 3, HTN who presented to the ER for progressive dyspnea over the past few months.  In ER, CXR reveals possible L perihilar mass. CT chest reveals abrupt termination of LUL bronch into area of consolidation in LUL and lingula, ? Hilar mass, small L pleural effusion with LLL consolidation, mildly preminent mediastinal nodes and mild cardiomegaly. Pulse ox in ER 67% on 2 L The patient has also been dealing with lower extremity edema for and his Norvasc was changed to Hydralazine. This did not improve his pedal edema and an ECHO was performed.   Subjective: He states his dyspnea has improved today. Mild cough but not much sputum. Legs still quite swollen.   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

## 2016-11-05 NOTE — Progress Notes (Signed)
Spoke with Dr. Ashok Cordia, requested pulmonary consultation this am for ? Hilar mass,

## 2016-11-05 NOTE — ED Notes (Signed)
Attempted report x1. 

## 2016-11-05 NOTE — ED Notes (Signed)
Dr. Maudie Mercury at the bedside.

## 2016-11-05 NOTE — ED Provider Notes (Signed)
[facility] DEPT Provider Note   CSN: [id number] Arrival date & time: 11/04/16  2342   By signing my name below, I, Eunice Blase, attest that this documentation has been prepared under the direction and in the presence of Delora Fuel, MD. Electronically signed, Eunice Blase, ED Scribe. 11/05/16. 1:45 AM.   History   Chief Complaint Chief Complaint  Patient presents with  . Shortness of Breath   The history is provided by the patient and medical records. No language interpreter was used.    Kevin Gutierrez is a 66 y.o. male with h/o HLD, basal cell carcinoma and chronic respiratory failure presenting to the Emergency Department concerning gradually worsening SOB x 2 months that became severe ~3 hours prior to evaluation. Associated cough productive of clear/white sputum, hot and cold chills and some worsening swelling to BLE's x 2 weeks. Wife also notes red dots to BLE's, stating the swelling and spots cause painful ambulation. Pt states his SOB is worse when laying down and better when sitting up, but he gets uncomfortable staying in this position; he states his most comfortable position is laying on his R side. Pt states his albuterol inhaler has provided mild relief to SOB. Echocardiogram performed ~3 days ago where blockages were found. Pt using 2L oxygen at home as advised. Pt recently placed on hydralazine to replace amlodipine. Pt recently decreased tobacco use to 5 cigarettes from 2-3 packs daily. H/o COPD and recent pneumonia dx (10/29/2016). Pt taking levaquin 500 mg currently for pneumonia. No fevers or wheezes.  Past Medical History:  Diagnosis Date  . Chicken pox   . COPD (chronic obstructive pulmonary disease) ([place])   . History of kidney stones 05/17/2015  . History of stomach ulcers 05/17/2015  . Hypertension   . Kidney stones   . Nephrolithiasis   . Peptic ulcer disease   . Vitamin D deficiency     Patient Active Problem List   Diagnosis Date Noted  . Lower  extremity edema 10/13/2016  . Chronic respiratory failure ([place]) 06/13/2016  . Anxiety state 04/05/2016  . DDD (degenerative disc disease), lumbar 03/14/2016  . Vitamin D deficiency 02/22/2016  . Hyperlipidemia 11/15/2015  . Basal cell carcinoma 11/15/2015  . CKD (chronic kidney disease) stage 3, GFR 30-59 ml/min 05/19/2015  . COPD (chronic obstructive pulmonary disease) ([place]) 05/17/2015  . Insomnia 05/17/2015  . Essential hypertension 05/05/2015  . Tobacco abuse 05/05/2015    Past Surgical History:  Procedure Laterality Date  . LITHOTRIPSY    . SPINAL CORD DECOMPRESSION  03/08/1994       Home Medications    Prior to Admission medications   Medication Sig Start Date End Date Taking? Authorizing Provider  acetaminophen (TYLENOL) 325 MG tablet Take 500 mg by mouth every 6 (six) hours as needed for moderate pain, fever or headache.     [provider]  albuterol (PROVENTIL) (2.5 MG/3ML) 0.083% nebulizer solution Take 3 mLs (2.5 mg total) by nebulization every 3 (three) hours as needed for wheezing or shortness of breath. 08/04/16   Coral Spikes, DO  albuterol (VENTOLIN HFA) 108 (90 Base) MCG/ACT inhaler Inhale 2 puffs into the lungs every 4 (four) hours as needed for wheezing or shortness of breath. 07/18/16   Flora Lipps, MD  aspirin EC 81 MG tablet Take 1 tablet (81 mg total) by mouth daily. 05/17/15   Coral Spikes, DO  atorvastatin (LIPITOR) 20 MG tablet Take 1 tablet (20 mg total) by mouth daily. 08/04/16   Lacinda Axon,  Jayce G, DO  Cholecalciferol (VITAMIN D3 PO) Take 2,000 mg by mouth daily.     [provider]  clonazePAM (KLONOPIN) 0.5 MG tablet Take 1 tablet (0.5 mg total) by mouth 2 (two) times daily as needed for anxiety. 11/02/16   Cook, Michael Boston G, DO  fluticasone furoate-vilanterol (BREO ELLIPTA) 200-25 MCG/INH AEPB Inhale 1 puff into the lungs daily. 03/27/16   Coral Spikes, DO  hydrALAZINE (APRESOLINE) 10 MG tablet Take 0.5 tablets (5 mg total) by mouth 3 (three)  times daily. 10/13/16   Coral Spikes, DO  levofloxacin (LEVAQUIN) 500 MG tablet Take 1 tablet (500 mg total) by mouth daily. 10/30/16   Coral Spikes, DO  pantoprazole (PROTONIX) 40 MG tablet Take 1 tablet (40 mg total) by mouth daily. 08/04/16   Coral Spikes, DO  tiotropium (SPIRIVA) 18 MCG inhalation capsule Place 1 capsule (18 mcg total) into inhaler and inhale daily. 08/30/16   Flora Lipps, MD  varenicline (CHANTIX STARTING MONTH PAK) 0.5 MG X 11 & 1 MG X 42 tablet 0.5 mg by mouth once daily for 3 days, then increase to 0.5 mg twice daily for 4 days, then increase to 1 mg tablet twice daily. 06/13/16   Coral Spikes, DO    Family History Family History  Problem Relation Age of Onset  . Diabetes Mellitus II Brother   . Lung cancer Brother   . Heart disease Brother   . Hyperlipidemia Brother   . Heart disease Mother   . Stroke Father   . Hypertension Brother     Social History Social History  Substance Use Topics  . Smoking status: Current Every Day Smoker    Packs/day: 0.50    Years: 50.00    Types: Cigarettes  . Smokeless tobacco: Never Used     Comment: Currently smoking 4 cigarettes daily  . Alcohol use No     Allergies   Patient has no known allergies.   Review of Systems Review of Systems  Constitutional: Positive for chills. Negative for fever.  Respiratory: Positive for cough and shortness of breath. Negative for wheezing.   Cardiovascular: Positive for leg swelling.  Allergic/Immunologic: Positive for immunocompromised state.  All other systems reviewed and are negative.    Physical Exam Updated Vital Signs BP (!) 154/66 (BP Location: Left Arm)   Pulse (!) 108   Temp (!) 97.4 F (36.3 C) (Oral)   Resp 18   SpO2 92%   Physical Exam  Constitutional: He is oriented to person, place, and time. He appears well-developed and well-nourished.  HENT:  Head: Normocephalic and atraumatic.  Eyes: Pupils are equal, round, and reactive to light. EOM are normal.    Neck: Normal range of motion. Neck supple. JVD present.  Cardiovascular: Normal rate, regular rhythm and normal heart sounds.   No murmur heard. Pulmonary/Chest: Effort normal. He has decreased breath sounds in the right upper field, the right middle field, the right lower field, the left upper field, the left middle field and the left lower field. He has no wheezes. He has no rales. He exhibits no tenderness.  Abdominal: Soft. Bowel sounds are normal. He exhibits no distension and no mass. There is no tenderness.  Musculoskeletal: Normal range of motion. He exhibits edema.  3+ pretibial edema  Lymphadenopathy:    He has no cervical adenopathy.  Neurological: He is alert and oriented to person, place, and time. No cranial nerve deficit. He exhibits normal muscle tone. Coordination normal.  Skin:  Skin is warm and dry. No rash noted.  Psychiatric: He has a normal mood and affect. His behavior is normal. Judgment and thought content normal.  Nursing note and vitals reviewed.    ED Treatments / Results  DIAGNOSTIC STUDIES: Oxygen Saturation is 92% on [place], low by my interpretation.    COORDINATION OF CARE: 1:43 AM-Discussed next steps with pt. Pt verbalized understanding and is agreeable with the plan. Will order medications, CAT scan and breathing treatment.   Labs (all labs ordered are listed, but only abnormal results are displayed) Labs Reviewed  CBC - Abnormal; Notable for the following:       Result Value   RDW 16.8 (*)    All other components within normal limits  BASIC METABOLIC PANEL - Abnormal; Notable for the following:    Glucose, Bld 178 (*)    Creatinine, Ser 1.42 (*)    Calcium 8.6 (*)    GFR calc non Af Amer 50 (*)    GFR calc Af Amer 58 (*)    All other components within normal limits  BRAIN NATRIURETIC PEPTIDE - Abnormal; Notable for the following:    B Natriuretic Peptide 370.4 (*)    All other components within normal limits  DIFFERENTIAL  Randolm Idol, ED     Radiology Dg Chest 2 View  Result Date: 11/05/2016 CLINICAL DATA:  66 year old male with shortness of breath. EXAM: CHEST  2 VIEW COMPARISON:  Chest radiograph dated 10/29/2016 FINDINGS: There has been interval worsening of the airspace disease primarily involving the left mid to lower lung field. Although findings may be infectious in etiology, a left hilar mass is not expanded excluded. Clinical correlation is recommended. CT of the chest with contrast may provide better evaluation. There is a small left pleural effusion. Stable cardiac silhouette. There is pectus excavatum deformity. No acute osseous pathology. IMPRESSION: Interval worsening of the left mid to lower lung field airspace disease well increase in the size of the left pleural effusion. Findings may be infectious in etiology. However, underlying hilar mass is not entirely excluded. Clinical correlation is recommended. CT of the chest with contrast may provide better evaluation. Electronically Signed   By: Anner Crete M.D.   On: 11/05/2016 01:16   Ct Chest Wo Contrast  Result Date: 11/05/2016 CLINICAL DATA:  66 year old male with possible left hilar mass. EXAM: CT CHEST WITHOUT CONTRAST TECHNIQUE: Multidetector CT imaging of the chest was performed following the standard protocol without IV contrast. COMPARISON:  Chest radiograph dated 11/05/2016 FINDINGS: Evaluation of this exam is limited in the absence of intravenous contrast. Cardiovascular: There is mild cardiomegaly. No pericardial effusion. There is coronary vascular calcification primarily involving the LAD, left circumflex artery. There is mild atherosclerotic calcification of the thoracic aorta. Evaluation of the aorta and pulmonary arteries is limited in the absence of intravenous contrast. Mediastinum/Nodes: Multiple top-normal mediastinal lymph nodes noted. No definite adenopathy. Evaluation of the left hilum is limited due to consolidative changes of the adjacent lung.  Esophagus is grossly unremarkable. No thyroid nodules noted. Lungs/Pleura: There is a small left pleural effusion. Left lower lobe consolidative changes may represent atelectasis versus infiltrate. There is consolidative changes of the left upper lobe and the lingula. There is abrupt termination of the left upper lobe bronchus. Findings may represent postobstructive atelectasis versus pneumonia. There is fullness of the left hilar soft tissue concerning for underlying mass. Evaluation of the hilum is limited on this noncontrast CT. Correlation with clinical exam and follow-up to resolution or  further evaluation with CT with IV contrast recommended. There is background of emphysema. Right lung base atelectasis/scarring noted. Upper Abdomen: No acute abnormality. Musculoskeletal: There is pectus deformity.  No acute fracture. IMPRESSION: 1. Apparent abrupt termination of the left upper lobe bronchus with large area consolidative changes in the left upper lobe and primarily lingula. Findings may represent postobstructive atelectasis versus pneumonia. There is fullness of the left hilar soft tissue concerning for a hilar mass. Evaluation is very limited on this noncontrast CT. Follow-up to resolution or further evaluation with CT with IV contrast recommended. 2. Small left pleural effusion and left lower lobe consolidative changes which may represent atelectasis versus infiltrate. 3. Mild cardiomegaly. 4. Multiple mildly prominent mediastinal lymph nodes Electronically Signed   By: Anner Crete M.D.   On: 11/05/2016 02:25    Procedures Procedures (including critical care time)  Medications Ordered in ED Medications  furosemide (LASIX) injection 40 mg (40 mg Intravenous Given 11/05/16 0227)  ipratropium-albuterol (DUONEB) 0.5-2.5 (3) MG/3ML nebulizer solution 3 mL (3 mLs Nebulization Given 11/05/16 0227)     Initial Impression / Assessment and Plan / ED Course  I have reviewed the triage vital signs and  the nursing notes.  Pertinent labs & imaging results that were available during my care of the patient were reviewed by me and considered in my medical decision making (see chart for details).  Peripheral edema which seems most consistent with heart failure. History of pneumonia which is not responding to antibiotics. Chest x-ray is obtained which does appear to show progression of his pneumonia. Old records were reviewed, and he did have a recent echocardiogram which showed ejection fraction of 35-40%. When his x-ray, I am concerned about upon possible hilar mass. He sent for noncontrast CT which appears to show a postobstructive pneumonia with possible hilar mass. He was given a dose of furosemide and an albuterol nebulizer treatment. Fungus, he stated he felt better. However, on ambulating, oxygen saturation dropped to 75%. He was felt that he is not safe to go home. I also am concerned about his need for evaluation of the possible hilar mass. Contrast CT may be difficult given his renal insufficiency-he may benefit from bronchoscopy. Case is discussed with Dr. Maudie Mercury [facility] who agrees to admit the patient. Of note, patient did not need sepsis evaluation. He had normal vital signs and had already been on antibiotics as an outpatient.  Final Clinical Impressions(s) / ED Diagnoses   Final diagnoses:  Community acquired pneumonia of right upper lobe of lung (HCC)  Pleural effusion on left  COPD exacerbation (HCC)  Acute on chronic systolic congestive heart failure (HCC)  Renal insufficiency    New Prescriptions New Prescriptions   No medications on file   I personally performed the services described in this documentation, which was scribed in my presence. The recorded information has been reviewed and is accurate.      Delora Fuel, MD 86/76/19 [phone]

## 2016-11-05 NOTE — Progress Notes (Signed)
Received report from ED RN, [place].

## 2016-11-05 NOTE — H&P (Signed)
TRH H&P   Patient Demographics:    Kevin Gutierrez, is a 66 y.o. male  MRN: [id number]   DOB - 1950/11/11  Admit Date - 11/05/2016  Outpatient Primary MD for the patient is Coral Spikes, DO  Referring MD/NP/PA: Dr. Roxanne Mins  Outpatient Specialists:  Dr. Mortimer Fries (pulmonary)  Patient coming from:  home  Chief Complaint  Patient presents with  . Shortness of Breath      HPI:    Kevin Gutierrez  is a 66 y.o. male, w Copd on home o2, ckd stage3, hypertension, cardiomyopathy (EF 35-40%)  C/o worsening dyspnea since May 2018.  Pt went to ER on Sunday , CXR => pneumonia, and tx with pneumonia.   Pox today 67% on 2L. + bilateral lower ext edema  Therefore presented to ED for evaluation.   In ED, CT chest=>  1. Apparent abrupt termination of the left upper lobe bronchus with large area consolidative changes in the left upper lobe and primarily lingula. Findings may represent postobstructive atelectasis versus pneumonia. There is fullness of the left hilar soft tissue concerning for a hilar mass. Evaluation is very limited on this noncontrast CT. Follow-up to resolution or further evaluation with CT with IV contrast recommended. 2. Small left pleural effusion and left lower lobe consolidative changes which may represent atelectasis versus infiltrate. 3. Mild cardiomegaly. 4. Multiple mildly prominent mediastinal lymph nodes  Pt is going to be treated for pneumonia.     Review of systems:    In addition to the HPI above,  No Fever-chills, No Headache, No changes with Vision or hearing, No problems swallowing food or Liquids, No Chest pain, Cough or Shortness of Breath, No Abdominal pain, No Nausea or Vommitting, Bowel movements are regular, No Blood in stool or Urine, No dysuria, No new skin rashes or bruises, No new joints pains-aches,  No new weakness, tingling, numbness in any  extremity, No recent weight gain or loss, No polyuria, polydypsia or polyphagia, No significant Mental Stressors.  A full 10 point Review of Systems was done, except as stated above, all other Review of Systems were negative.   With Past History of the following :    Past Medical History:  Diagnosis Date  . Chicken pox   . COPD (chronic obstructive pulmonary disease) ([place])   . History of kidney stones 05/17/2015  . History of stomach ulcers 05/17/2015  . Hypertension   . Kidney stones   . Nephrolithiasis   . Peptic ulcer disease   . Vitamin D deficiency       Past Surgical History:  Procedure Laterality Date  . LITHOTRIPSY    . SPINAL CORD DECOMPRESSION  03/08/1994      Social History:     Social History  Substance Use Topics  . Smoking status: Current Every Day Smoker    Packs/day: 0.50    Years: 50.00  Types: Cigarettes  . Smokeless tobacco: Never Used     Comment: Currently smoking 4 cigarettes daily  . Alcohol use No     Lives - at home  Mobility - walks without assitance   Family History :     Family History  Problem Relation Age of Onset  . Diabetes Mellitus II Brother   . Lung cancer Brother   . Heart disease Brother   . Hyperlipidemia Brother   . Heart disease Mother   . Stroke Father   . Hypertension Brother       Home Medications:   Prior to Admission medications   Medication Sig Start Date End Date Taking? Authorizing Provider  acetaminophen (TYLENOL) 325 MG tablet Take 500 mg by mouth every 6 (six) hours as needed for moderate pain, fever or headache.    Yes [provider]  albuterol (PROVENTIL) (2.5 MG/3ML) 0.083% nebulizer solution Take 3 mLs (2.5 mg total) by nebulization every 3 (three) hours as needed for wheezing or shortness of breath. 08/04/16  Yes Cook, Jayce G, DO  albuterol (VENTOLIN HFA) 108 (90 Base) MCG/ACT inhaler Inhale 2 puffs into the lungs every 4 (four) hours as needed for wheezing or shortness of breath.  07/18/16  Yes Kasa, Maretta Bees, MD  amLODipine (NORVASC) 5 MG tablet Take 5 mg by mouth daily.   Yes [provider]  aspirin EC 81 MG tablet Take 1 tablet (81 mg total) by mouth daily. 05/17/15  Yes Cook, Jayce G, DO  atorvastatin (LIPITOR) 20 MG tablet Take 1 tablet (20 mg total) by mouth daily. Patient taking differently: Take 20 mg by mouth every other day.  08/04/16  Yes Cook, Jayce G, DO  clonazePAM (KLONOPIN) 0.5 MG tablet Take 1 tablet (0.5 mg total) by mouth 2 (two) times daily as needed for anxiety. 11/02/16  Yes Cook, Jayce G, DO  fluticasone furoate-vilanterol (BREO ELLIPTA) 200-25 MCG/INH AEPB Inhale 1 puff into the lungs daily. 03/27/16  Yes Cook, Jayce G, DO  HYDROcodone-acetaminophen (NORCO) 10-325 MG tablet Take 1 tablet by mouth every 6 (six) hours as needed for moderate pain.   Yes [provider]  levofloxacin (LEVAQUIN) 500 MG tablet Take 1 tablet (500 mg total) by mouth daily. 10/30/16  Yes Cook, Jayce G, DO  pantoprazole (PROTONIX) 40 MG tablet Take 1 tablet (40 mg total) by mouth daily. 08/04/16  Yes Cook, Jayce G, DO  tiotropium (SPIRIVA) 18 MCG inhalation capsule Place 1 capsule (18 mcg total) into inhaler and inhale daily. 08/30/16  Yes Flora Lipps, MD  hydrALAZINE (APRESOLINE) 10 MG tablet Take 0.5 tablets (5 mg total) by mouth 3 (three) times daily. Patient not taking: Reported on 11/05/2016 10/13/16   Coral Spikes, DO  varenicline (CHANTIX STARTING MONTH PAK) 0.5 MG X 11 & 1 MG X 42 tablet 0.5 mg by mouth once daily for 3 days, then increase to 0.5 mg twice daily for 4 days, then increase to 1 mg tablet twice daily. Patient not taking: Reported on 11/05/2016 06/13/16   Coral Spikes, DO     Allergies:    No Known Allergies   Physical Exam:   Vitals  Blood pressure 111/75, pulse (!) 101, temperature (!) 97.4 F (36.3 C), temperature source Oral, resp. rate (!) 24, SpO2 91 %.   1. General  lying in bed in NAD,  2. Normal affect and insight, Not Suicidal or  Homicidal, Awake Alert, Oriented X 3.  3. No F.N deficits, ALL C.Nerves Intact, Strength 5/5  all 4 extremities, Sensation intact all 4 extremities, Plantars down going.  4. Ears and Eyes appear Normal, Conjunctivae clear, PERRLA. Moist Oral Mucosa.  5. Supple Neck, No JVD, No cervical lymphadenopathy appriciated, No Carotid Bruits.  6. Symmetrical Chest wall movement, Good air movement bilaterally, CTAB.  7. RRR, No Gallops, Rubs or Murmurs, No Parasternal Heave.  8. Positive Bowel Sounds, Abdomen Soft, No tenderness, No organomegaly appriciated,No rebound -guarding or rigidity.  9.  No Cyanosis, Normal Skin Turgor, No Skin Rash or Bruise.  10. Good muscle tone,  joints appear normal , no effusions, Normal ROM.  11. No Palpable Lymph Nodes in Neck or Axillae     Data Review:    CBC  Recent Labs Lab 10/29/16 1440 11/04/16 2357  WBC 7.5 8.8  HGB 14.1 14.0  HCT 42.9 44.8  PLT 183 181  MCV 95.7 100.0  MCH 31.4 31.3  MCHC 32.8 31.3  RDW 16.5* 16.8*  LYMPHSABS  --  1.7  MONOABS  --  0.8  EOSABS  --  0.1  BASOSABS  --  0.0   ------------------------------------------------------------------------------------------------------------------  Chemistries   Recent Labs Lab 10/29/16 1440 11/04/16 2357  NA 140 137  K 4.0 3.9  CL 103 103  CO2 29 28  GLUCOSE 193* 178*  BUN 14 11  CREATININE 1.24 1.42*  CALCIUM 8.6* 8.6*   ------------------------------------------------------------------------------------------------------------------ CrCl cannot be calculated (Unknown ideal weight.). ------------------------------------------------------------------------------------------------------------------ No results for input(s): TSH, T4TOTAL, T3FREE, THYROIDAB in the last 72 hours.  Invalid input(s): FREET3  Coagulation profile No results for input(s): INR, PROTIME in the last 168  hours. ------------------------------------------------------------------------------------------------------------------- No results for input(s): DDIMER in the last 72 hours. -------------------------------------------------------------------------------------------------------------------  Cardiac Enzymes No results for input(s): CKMB, TROPONINI, MYOGLOBIN in the last 168 hours.  Invalid input(s): CK ------------------------------------------------------------------------------------------------------------------    Component Value Date/Time   BNP 370.4 (H) 11/04/2016 2357     ---------------------------------------------------------------------------------------------------------------  Urinalysis    Component Value Date/Time   COLORURINE YELLOW (A) 07/22/2016 0748   APPEARANCEUR CLEAR (A) 07/22/2016 0748   APPEARANCEUR Clear 03/15/2016 0853   LABSPEC 1.009 07/22/2016 0748   PHURINE 6.0 07/22/2016 0748   GLUCOSEU NEGATIVE 07/22/2016 0748   HGBUR MODERATE (A) 07/22/2016 0748   BILIRUBINUR NEGATIVE 07/22/2016 0748   BILIRUBINUR Negative 03/15/2016 0853   KETONESUR NEGATIVE 07/22/2016 0748   PROTEINUR 30 (A) 07/22/2016 0748   NITRITE NEGATIVE 07/22/2016 0748   LEUKOCYTESUR NEGATIVE 07/22/2016 0748   LEUKOCYTESUR Negative 03/15/2016 0853    ----------------------------------------------------------------------------------------------------------------   Imaging Results:    Dg Chest 2 View  Result Date: 11/05/2016 CLINICAL DATA:  65 year old male with shortness of breath. EXAM: CHEST  2 VIEW COMPARISON:  Chest radiograph dated 10/29/2016 FINDINGS: There has been interval worsening of the airspace disease primarily involving the left mid to lower lung field. Although findings may be infectious in etiology, a left hilar mass is not expanded excluded. Clinical correlation is recommended. CT of the chest with contrast may provide better evaluation. There is a small left pleural  effusion. Stable cardiac silhouette. There is pectus excavatum deformity. No acute osseous pathology. IMPRESSION: Interval worsening of the left mid to lower lung field airspace disease well increase in the size of the left pleural effusion. Findings may be infectious in etiology. However, underlying hilar mass is not entirely excluded. Clinical correlation is recommended. CT of the chest with contrast may provide better evaluation. Electronically Signed   By: Anner Crete M.D.   On: 11/05/2016 01:16   Ct Chest Wo Contrast  Result Date: 11/05/2016 CLINICAL DATA:  66 year old male with possible left hilar mass. EXAM: CT CHEST WITHOUT CONTRAST TECHNIQUE: Multidetector CT imaging of the chest was performed following the standard protocol without IV contrast. COMPARISON:  Chest radiograph dated 11/05/2016 FINDINGS: Evaluation of this exam is limited in the absence of intravenous contrast. Cardiovascular: There is mild cardiomegaly. No pericardial effusion. There is coronary vascular calcification primarily involving the LAD, left circumflex artery. There is mild atherosclerotic calcification of the thoracic aorta. Evaluation of the aorta and pulmonary arteries is limited in the absence of intravenous contrast. Mediastinum/Nodes: Multiple top-normal mediastinal lymph nodes noted. No definite adenopathy. Evaluation of the left hilum is limited due to consolidative changes of the adjacent lung. Esophagus is grossly unremarkable. No thyroid nodules noted. Lungs/Pleura: There is a small left pleural effusion. Left lower lobe consolidative changes may represent atelectasis versus infiltrate. There is consolidative changes of the left upper lobe and the lingula. There is abrupt termination of the left upper lobe bronchus. Findings may represent postobstructive atelectasis versus pneumonia. There is fullness of the left hilar soft tissue concerning for underlying mass. Evaluation of the hilum is limited on this  noncontrast CT. Correlation with clinical exam and follow-up to resolution or further evaluation with CT with IV contrast recommended. There is background of emphysema. Right lung base atelectasis/scarring noted. Upper Abdomen: No acute abnormality. Musculoskeletal: There is pectus deformity.  No acute fracture. IMPRESSION: 1. Apparent abrupt termination of the left upper lobe bronchus with large area consolidative changes in the left upper lobe and primarily lingula. Findings may represent postobstructive atelectasis versus pneumonia. There is fullness of the left hilar soft tissue concerning for a hilar mass. Evaluation is very limited on this noncontrast CT. Follow-up to resolution or further evaluation with CT with IV contrast recommended. 2. Small left pleural effusion and left lower lobe consolidative changes which may represent atelectasis versus infiltrate. 3. Mild cardiomegaly. 4. Multiple mildly prominent mediastinal lymph nodes Electronically Signed   By: Anner Crete M.D.   On: 11/05/2016 02:25       Assessment & Plan:    Active Problems:   Essential hypertension   COPD (chronic obstructive pulmonary disease) ([facility])   CKD (chronic kidney disease) stage 3, GFR 30-59 ml/min   Pneumonia   Hyperglycemia    Acute respiratory failure (hypoxic) secondary to pneumonia Urine strep antigen, urine legionella antigen Blood culture x2 Start vanco iv, rocephin iv, zithromax Pulmonary to evaluate  Copd exacerbation Solumedrol 80mg  iv tid spiriva Albuterol  ? Hilar mass Check CT scan ? Pulmonary consult  LE edema Ultrasound r/o DVT  Cardiomyopathy Lasix iv x1 given in ED.   CKD stage 3 Check cmp in am  Hyperglycemia Check hga1c  DVT Prophylaxis   heparin- SCDs   AM Labs Ordered, also please review Full Orders  Family Communication: Admission, patients condition and plan of care including tests being ordered have been discussed with the patient  who indicate understanding  and agree with the plan and Code Status.  Code Status  FULL CODE  Likely DC to  home  Condition GUARDED    Consults called: pulmonary  Admission status: inpatient  Time spent in minutes : 45   Jani Gravel M.D on 11/05/2016 at 4:51 AM  Between 7pm to 7am - Pager - [phone]. After 7am go to [email or web address] - password [facility]  [facility] - Office  [phone]

## 2016-11-05 NOTE — Consult Note (Signed)
Name: Kevin Gutierrez MRN: [id number] DOB: [birth year]    ADMISSION DATE:  11/05/2016 CONSULTATION DATE:  7/15 REFERRING MD :  Maudie Mercury  CHIEF COMPLAINT: abnormal CT chest. Also effusion   BRIEF PATIENT DESCRIPTION:   This is a 66 year old male f/b [place] for chronic respiratory failure in setting of COPD (on home oxygen), ckd stage III, and HFrEF (35-40%) and also has h/o basal cell carcinoma. Presented to ED w/ what he describes as progressive dyspnea since May 2018. Had been started on Levaquin on 7/9 for pneumonia w/ planned 10d course. In the ER pulse ox 67% on 2 liters, nml wbc ct, afebrile but CT chest raised concern for LUL consolidation w/ abrupt termination of the lul. Also concern for hilar mass and LLL consolidation w/ element of effusion. PCCM was asked to see re: this abnormal CT chest   SIGNIFICANT EVENTS    STUDIES:  CT chest 7/15: There is a small left pleural effusion. Left lower lobe consolidative changes may represent atelectasis versus infiltrate. There is consolidative changes of the left upper lobe and the lingula. There is abrupt termination of the left upper lobe bronchus. Findings may represent postobstructive atelectasis versus pneumonia. There is fullness of the left hilar soft tissue concerning for underlying mass. Evaluation of the hilum is limited on this noncontrast CT. Correlation with clinical exam and follow-up to resolution or further evaluation with CT with IV contrast recommended. There is background of emphysema. Right lung base atelectasis/scarring noted.   HISTORY OF PRESENT ILLNESS:  See above   PAST MEDICAL HISTORY :   has a past medical history of Chicken pox; COPD (chronic obstructive pulmonary disease) ([place]); History of kidney stones (05/17/2015); History of stomach ulcers (05/17/2015); Hypertension; Kidney stones; Nephrolithiasis; Peptic ulcer disease; and Vitamin D deficiency.  has a past surgical history that includes Lithotripsy and Spinal cord  decompression (03/08/1994). Prior to Admission medications   Medication Sig Start Date End Date Taking? Authorizing Provider  acetaminophen (TYLENOL) 325 MG tablet Take 500 mg by mouth every 6 (six) hours as needed for moderate pain, fever or headache.    Yes [provider]  albuterol (PROVENTIL) (2.5 MG/3ML) 0.083% nebulizer solution Take 3 mLs (2.5 mg total) by nebulization every 3 (three) hours as needed for wheezing or shortness of breath. 08/04/16  Yes Cook, Jayce G, DO  albuterol (VENTOLIN HFA) 108 (90 Base) MCG/ACT inhaler Inhale 2 puffs into the lungs every 4 (four) hours as needed for wheezing or shortness of breath. 07/18/16  Yes [place], Maretta Bees, MD  amLODipine (NORVASC) 5 MG tablet Take 5 mg by mouth daily.   Yes [provider]  aspirin EC 81 MG tablet Take 1 tablet (81 mg total) by mouth daily. 05/17/15  Yes Cook, Jayce G, DO  atorvastatin (LIPITOR) 20 MG tablet Take 1 tablet (20 mg total) by mouth daily. Patient taking differently: Take 20 mg by mouth every other day.  08/04/16  Yes Cook, Jayce G, DO  clonazePAM (KLONOPIN) 0.5 MG tablet Take 1 tablet (0.5 mg total) by mouth 2 (two) times daily as needed for anxiety. 11/02/16  Yes Cook, Jayce G, DO  fluticasone furoate-vilanterol (BREO ELLIPTA) 200-25 MCG/INH AEPB Inhale 1 puff into the lungs daily. 03/27/16  Yes Cook, Jayce G, DO  HYDROcodone-acetaminophen (NORCO) 10-325 MG tablet Take 1 tablet by mouth every 6 (six) hours as needed for moderate pain.   Yes [provider]  levofloxacin (LEVAQUIN) 500 MG tablet Take 1 tablet (500 mg total) by  mouth daily. 10/30/16  Yes Cook, Jayce G, DO  pantoprazole (PROTONIX) 40 MG tablet Take 1 tablet (40 mg total) by mouth daily. 08/04/16  Yes Cook, Jayce G, DO  tiotropium (SPIRIVA) 18 MCG inhalation capsule Place 1 capsule (18 mcg total) into inhaler and inhale daily. 08/30/16  Yes Flora Lipps, MD  hydrALAZINE (APRESOLINE) 10 MG tablet Take 0.5 tablets (5 mg total) by mouth 3 (three)  times daily. Patient not taking: Reported on 11/05/2016 10/13/16   Coral Spikes, DO  varenicline (CHANTIX STARTING MONTH PAK) 0.5 MG X 11 & 1 MG X 42 tablet 0.5 mg by mouth once daily for 3 days, then increase to 0.5 mg twice daily for 4 days, then increase to 1 mg tablet twice daily. Patient not taking: Reported on 11/05/2016 06/13/16   Coral Spikes, DO   No Known Allergies  FAMILY HISTORY:  family history includes Diabetes Mellitus II in his brother; Heart disease in his brother and mother; Hyperlipidemia in his brother; Hypertension in his brother; Lung cancer in his brother; Stroke in his father. SOCIAL HISTORY:  reports that he has been smoking Cigarettes.  He has a 25.00 pack-year smoking history. He has never used smokeless tobacco. He reports that he uses drugs, including Marijuana. He reports that he does not drink alcohol.  REVIEW OF SYSTEMS:   Constitutional: hot and cold, some chills, weight loss, malaise/fatigue and diaphoresis.  HENT: Negative for hearing loss, ear pain, nosebleeds, congestion, sore throat, neck pain, tinnitus and ear discharge.   Eyes: Negative for blurred vision, double vision, photophobia, pain, discharge and redness.  Respiratory: No hemoptysis, clear to white sputum production, shortness of breath, wheezing and stridor.  most comfortable when lying on right side.  Cardiovascular: Negative for chest pain, palpitations,+ orthopnea, claudication, leg swelling and PND.  Gastrointestinal: Negative for heartburn, nausea, vomiting, abdominal pain, diarrhea, constipation, blood in stool and melena.  Genitourinary: Negative for dysuria, urgency, frequency, hematuria and flank pain.  Musculoskeletal: Negative for myalgias, back pain, joint pain and falls.  Skin: Negative for itching and rash.  Neurological: Negative for dizziness, tingling, tremors, sensory change, speech change, focal weakness, seizures, loss of consciousness, weakness and headaches.    Endo/Heme/Allergies: Negative for environmental allergies and polydipsia. Does not bruise/bleed easily.  SUBJECTIVE:  Feels no distress.  VITAL SIGNS: Temp:  [97.4 F (36.3 C)-98.1 F (36.7 C)] 98.1 F (36.7 C) (07/15 0535) Pulse Rate:  [97-108] 104 (07/15 0535) Resp:  [17-24] 20 (07/15 0535) BP: (106-181)/(63-97) 134/84 (07/15 0535) SpO2:  [88 %-98 %] 88 % (07/15 0824) Weight:  [195 lb 12.8 oz (88.8 kg)] 195 lb 12.8 oz (88.8 kg) (07/15 0535)  PHYSICAL EXAMINATION: General appearance:  66 Year old  Male well nourishedNAD, currently in acute distress,  conversant  Eyes: anicteric sclerae, moist conjunctivae; PERRL, EOMI bilaterally. Mouth:  membranes and no mucosal ulcerations; normal hard and soft palate Neck: Trachea midline; neck supple, no JVD Lungs/chest: decreased on left. No sig wheeze. with normal respiratory effort and no intercostal retractions CV: RRR, no MRGs  Abdomen: Soft, non-tender; no masses or HSM Extremities: +  peripheral edema  Brisk CR  Skin: Normal temperature, turgor and texture; no rash Psych: Appropriate affect, alert and oriented to person, place and time   Recent Labs Lab 10/29/16 1440 11/04/16 2357 11/05/16 0838  NA 140 137 140  K 4.0 3.9 4.1  CL 103 103 103  CO2 29 28 28   BUN 14 11 12   CREATININE 1.24 1.42* 1.40*  GLUCOSE 193* 178* 126*    Recent Labs Lab 10/29/16 1440 11/04/16 2357  HGB 14.1 14.0  HCT 42.9 44.8  WBC 7.5 8.8  PLT 183 181   Dg Chest 2 View  Result Date: 11/05/2016 CLINICAL DATA:  66 year old male with shortness of breath. EXAM: CHEST  2 VIEW COMPARISON:  Chest radiograph dated 10/29/2016 FINDINGS: There has been interval worsening of the airspace disease primarily involving the left mid to lower lung field. Although findings may be infectious in etiology, a left hilar mass is not expanded excluded. Clinical correlation is recommended. CT of the chest with contrast may provide better evaluation. There is a small left  pleural effusion. Stable cardiac silhouette. There is pectus excavatum deformity. No acute osseous pathology. IMPRESSION: Interval worsening of the left mid to lower lung field airspace disease well increase in the size of the left pleural effusion. Findings may be infectious in etiology. However, underlying hilar mass is not entirely excluded. Clinical correlation is recommended. CT of the chest with contrast may provide better evaluation. Electronically Signed   By: Anner Crete M.D.   On: 11/05/2016 01:16   Ct Chest Wo Contrast  Result Date: 11/05/2016 CLINICAL DATA:  66 year old male with possible left hilar mass. EXAM: CT CHEST WITHOUT CONTRAST TECHNIQUE: Multidetector CT imaging of the chest was performed following the standard protocol without IV contrast. COMPARISON:  Chest radiograph dated 11/05/2016 FINDINGS: Evaluation of this exam is limited in the absence of intravenous contrast. Cardiovascular: There is mild cardiomegaly. No pericardial effusion. There is coronary vascular calcification primarily involving the LAD, left circumflex artery. There is mild atherosclerotic calcification of the thoracic aorta. Evaluation of the aorta and pulmonary arteries is limited in the absence of intravenous contrast. Mediastinum/Nodes: Multiple top-normal mediastinal lymph nodes noted. No definite adenopathy. Evaluation of the left hilum is limited due to consolidative changes of the adjacent lung. Esophagus is grossly unremarkable. No thyroid nodules noted. Lungs/Pleura: There is a small left pleural effusion. Left lower lobe consolidative changes may represent atelectasis versus infiltrate. There is consolidative changes of the left upper lobe and the lingula. There is abrupt termination of the left upper lobe bronchus. Findings may represent postobstructive atelectasis versus pneumonia. There is fullness of the left hilar soft tissue concerning for underlying mass. Evaluation of the hilum is limited on this  noncontrast CT. Correlation with clinical exam and follow-up to resolution or further evaluation with CT with IV contrast recommended. There is background of emphysema. Right lung base atelectasis/scarring noted. Upper Abdomen: No acute abnormality. Musculoskeletal: There is pectus deformity.  No acute fracture. IMPRESSION: 1. Apparent abrupt termination of the left upper lobe bronchus with large area consolidative changes in the left upper lobe and primarily lingula. Findings may represent postobstructive atelectasis versus pneumonia. There is fullness of the left hilar soft tissue concerning for a hilar mass. Evaluation is very limited on this noncontrast CT. Follow-up to resolution or further evaluation with CT with IV contrast recommended. 2. Small left pleural effusion and left lower lobe consolidative changes which may represent atelectasis versus infiltrate. 3. Mild cardiomegaly. 4. Multiple mildly prominent mediastinal lymph nodes Electronically Signed   By: Anner Crete M.D.   On: 11/05/2016 02:25   abx Ceftriaxone 7/14>>> vanc 7/15>>> azith 7/15>>>  ASSESSMENT / PLAN:  Acute on chronic hypoxic respiratory failure in setting of CAP vs post-obstructive atx +/- pna. Possible hilar lung mass Mod-large Left effusion (Verified by [place] chest 7/15) COPD w/ AECOPD  HFrEF (35-40%) Stage III CKD LE edema  Discussion  Acute on chronic hypoxic respiratory failure in setting of CAP vs post-obstructive atx/pna c/b by AECOPD and  Concern for left hilar mass as well as left effusion. He is still an active smoker. His story isn't text book for pneumonia. Does have mod to large left effusion by bedside US.  Will get thoracentesis in am per pt request. If cytology neg will need bronch.  Plan Cont current abx Send urine antigens Diagnostic/therpeutic thoracentesis 7/16 If Cytology neg will need [place] ACNP-BC [place] Pager # [phone] OR # [phone] if no  answer  11/05/2016, 2:10 PM

## 2016-11-05 NOTE — Progress Notes (Signed)
**  Preliminary report by tech**  Bilateral lower extremity venous duplex completed. There is no evidence of deep or superficial vein thrombosis involving the right and left lower extremities. All visualized vessels appear patent and compressible. There is no evidence of Baker's cysts bilaterally.  11/05/16 5:12 PM Kevin Gutierrez RVT

## 2016-11-06 ENCOUNTER — Encounter (HOSPITAL_COMMUNITY): Payer: Self-pay | Admitting: Student

## 2016-11-06 ENCOUNTER — Ambulatory Visit: Payer: PPO | Admitting: Cardiovascular Disease

## 2016-11-06 ENCOUNTER — Inpatient Hospital Stay (HOSPITAL_COMMUNITY): Payer: PPO

## 2016-11-06 DIAGNOSIS — J9601 Acute respiratory failure with hypoxia: Secondary | ICD-10-CM

## 2016-11-06 DIAGNOSIS — N183 Chronic kidney disease, stage 3 (moderate): Secondary | ICD-10-CM

## 2016-11-06 DIAGNOSIS — I10 Essential (primary) hypertension: Secondary | ICD-10-CM

## 2016-11-06 DIAGNOSIS — J441 Chronic obstructive pulmonary disease with (acute) exacerbation: Secondary | ICD-10-CM

## 2016-11-06 DIAGNOSIS — I5041 Acute combined systolic (congestive) and diastolic (congestive) heart failure: Secondary | ICD-10-CM

## 2016-11-06 DIAGNOSIS — J9600 Acute respiratory failure, unspecified whether with hypoxia or hypercapnia: Secondary | ICD-10-CM

## 2016-11-06 LAB — COMPREHENSIVE METABOLIC PANEL
ALBUMIN: 2.9 g/dL — AB (ref 3.5–5.0)
ALK PHOS: 103 U/L (ref 38–126)
ALT: 31 U/L (ref 17–63)
AST: 28 U/L (ref 15–41)
Anion gap: 7 (ref 5–15)
BILIRUBIN TOTAL: 0.6 mg/dL (ref 0.3–1.2)
BUN: 18 mg/dL (ref 6–20)
CALCIUM: 8.2 mg/dL — AB (ref 8.9–10.3)
CO2: 30 mmol/L (ref 22–32)
CREATININE: 1.41 mg/dL — AB (ref 0.61–1.24)
Chloride: 99 mmol/L — ABNORMAL LOW (ref 101–111)
GFR calc Af Amer: 58 mL/min — ABNORMAL LOW (ref >60)
GFR calc non Af Amer: 50 mL/min — ABNORMAL LOW (ref >60)
GLUCOSE: 399 mg/dL — AB (ref 65–99)
Potassium: 4.3 mmol/L (ref 3.5–5.1)
SODIUM: 136 mmol/L (ref 135–145)
TOTAL PROTEIN: 6.3 g/dL — AB (ref 6.5–8.1)

## 2016-11-06 LAB — CBC
HEMATOCRIT: 44.1 % (ref 39.0–52.0)
HEMOGLOBIN: 13.5 g/dL (ref 13.0–17.0)
MCH: 31 pg (ref 26.0–34.0)
MCHC: 30.6 g/dL (ref 30.0–36.0)
MCV: 101.4 fL — ABNORMAL HIGH (ref 78.0–100.0)
Platelets: 159 10*3/uL (ref 150–400)
RBC: 4.35 MIL/uL (ref 4.22–5.81)
RDW: 16.3 % — ABNORMAL HIGH (ref 11.5–15.5)
WBC: 12 10*3/uL — ABNORMAL HIGH (ref 4.0–10.5)

## 2016-11-06 LAB — BODY FLUID CELL COUNT WITH DIFFERENTIAL
LYMPHS FL: 8 %
Monocyte-Macrophage-Serous Fluid: 17 % — ABNORMAL LOW (ref 50–90)
NEUTROPHIL FLUID: 75 % — AB (ref 0–25)
WBC FLUID: 1950 uL — AB (ref 0–1000)

## 2016-11-06 LAB — LACTATE DEHYDROGENASE: LDH: 222 U/L — AB (ref 98–192)

## 2016-11-06 LAB — PROTEIN, PLEURAL OR PERITONEAL FLUID: Total protein, fluid: <3 g/dL

## 2016-11-06 LAB — LACTATE DEHYDROGENASE, PLEURAL OR PERITONEAL FLUID: LD FL: 133 U/L — AB (ref 3–23)

## 2016-11-06 LAB — CHOLESTEROL, TOTAL: Cholesterol: 122 mg/dL (ref 0–200)

## 2016-11-06 LAB — PROTEIN, TOTAL: Total Protein: 6.6 g/dL (ref 6.5–8.1)

## 2016-11-06 MED ORDER — DEXTROMETHORPHAN POLISTIREX ER 30 MG/5ML PO SUER
15.0000 mg | Freq: Two times a day (BID) | ORAL | Status: DC
  Administered 2016-11-06 – 2016-11-11 (×10): 15 mg via ORAL
  Filled 2016-11-06 (×12): qty 5

## 2016-11-06 MED ORDER — SODIUM CHLORIDE 0.9% FLUSH
3.0000 mL | Freq: Two times a day (BID) | INTRAVENOUS | Status: DC
  Administered 2016-11-06: 3 mL via INTRAVENOUS

## 2016-11-06 MED ORDER — METHYLPREDNISOLONE SODIUM SUCC 125 MG IJ SOLR
80.0000 mg | Freq: Two times a day (BID) | INTRAMUSCULAR | Status: DC
  Administered 2016-11-06 – 2016-11-07 (×2): 80 mg via INTRAVENOUS
  Filled 2016-11-06 (×2): qty 2

## 2016-11-06 MED ORDER — SODIUM CHLORIDE 0.9 % IV SOLN: 250.0000 mL | INTRAVENOUS | Status: DC | PRN

## 2016-11-06 MED ORDER — ASPIRIN 81 MG PO CHEW
81.0000 mg | CHEWABLE_TABLET | ORAL | Status: AC
  Administered 2016-11-07: 81 mg via ORAL
  Filled 2016-11-06: qty 1

## 2016-11-06 MED ORDER — SODIUM CHLORIDE 0.9% FLUSH: 3.0000 mL | INTRAVENOUS | Status: DC | PRN

## 2016-11-06 MED ORDER — SODIUM CHLORIDE 0.9 % IV SOLN
INTRAVENOUS | Status: DC
  Administered 2016-11-07: 50 mL/h via INTRAVENOUS

## 2016-11-06 MED ORDER — AZITHROMYCIN 500 MG PO TABS
500.0000 mg | ORAL_TABLET | Freq: Every day | ORAL | Status: DC
  Administered 2016-11-06 – 2016-11-07 (×2): 500 mg via ORAL
  Filled 2016-11-06 (×3): qty 1

## 2016-11-06 MED ORDER — BENZONATATE 100 MG PO CAPS
100.0000 mg | ORAL_CAPSULE | Freq: Three times a day (TID) | ORAL | Status: DC
  Administered 2016-11-06 – 2016-11-11 (×14): 100 mg via ORAL
  Filled 2016-11-06 (×14): qty 1

## 2016-11-06 NOTE — Consult Note (Signed)
Cardiology Consultation:   Patient ID: Kevin Gutierrez; [id number]; May 18, 1950   Admit date: 11/05/2016 Date of Consult: 11/06/2016  Primary Care Provider: Coral Spikes, DO Primary Cardiologist: New to [facility] - Dr. Sallyanne Kuster   Patient Profile:   Kevin Gutierrez is a 66 y.o. male with past medical history of HTN, HLD, COPD, and Stage 3 CKD who is being seen today for the evaluation of reduced EF at the request of Dr. Wynelle Cleveland.  History of Present Illness:   Kevin Gutierrez presented to [facility] ED on 11/04/2016 for evaluation of worsening dyspnea on exertion for the past several months which acutely worsened the day of presentation.   In talking with the patient, he reports having acute worsening of his dyspnea starting two months prior. Was previously using Lind at night, but then started having to use this with ambulation. Denies any associated chest pain or palpitations around that time frame or prior to his worsening dyspnea. His wife checks his O2 saturation and HR at home and notes his HR has been in the 90's -120's since. He has noticed worsening abdominal distension and lower extremity edema over the period of time. Denies any orthopnea or PND.   He denies any prior history of known CAD. Does report a strong family history of CAD with his mother passing away from a massive MI in her 60's and his brother having CABG in his 18's. His other brother has lung cancer. The patient use to smoke 2 ppd but is now smoking < 1 ppd. He denies any alcohol use or recreational drug use.   Initial labs show WBC of 8.8, Hgb 14.0, platelets 181, Na+ 137, K+ 3.9, creatinine 1.42 (close to baseline). BNP 370. Cyclic troponin values at 0.03, 0.04, and 0.03. EKG shows sinus tachycardia, HR 103, with LVH and lateral TWI. CXR showed interval worsening of the left mid to lower lung field airspace disease with increase in the size of the left pleural effusion. CT Chest with apparent abrupt termination of the left upper lobe  bronchus with large area consolidative changes in the left upper lobe and primarily lingula. Findings may represent postobstructive atelectasis versus pneumonia. An echocardiogram had been obtained on 7/12 and showed a reduced EF of 35-40% with Grade 1 DD and diffuse HK.   He has been admitted with acute respiratory failure secondary to PNA and COPD exacerbation as O2 saturations dropped to 75% while in the ED.   CCM was consulted for evaluation of his left hilar mass and left pleural effusion. His effusion was moderate to large by bedside US, and he underwent a thoracentesis earlier today with 800 cc of fluid removed. Fluid analysis is pending.    Past Medical History:  Diagnosis Date  . Chicken pox   . COPD (chronic obstructive pulmonary disease) ([place])   . History of kidney stones 05/17/2015  . History of stomach ulcers 05/17/2015  . Hypertension   . Kidney stones   . Nephrolithiasis   . Peptic ulcer disease   . Vitamin D deficiency     Past Surgical History:  Procedure Laterality Date  . LITHOTRIPSY    . SPINAL CORD DECOMPRESSION  03/08/1994     Inpatient Medications: Scheduled Meds: . aspirin EC  81 mg Oral Daily  . atorvastatin  20 mg Oral QODAY  . azithromycin  500 mg Oral Daily  . benzonatate  100 mg Oral TID  . dextromethorphan  15 mg Oral BID  . fluticasone furoate-vilanterol  1  puff Inhalation Daily  . furosemide  40 mg Intravenous BID  . heparin  5,000 Units Subcutaneous Q8H  . hydrALAZINE  10 mg Oral Q8H  . methylPREDNISolone (SOLU-MEDROL) injection  80 mg Intravenous Q12H  . metoprolol tartrate  25 mg Oral BID  . pantoprazole  40 mg Oral Daily  . tiotropium  18 mcg Inhalation Daily   Continuous Infusions: . cefTRIAXone (ROCEPHIN)  IV 1 g (11/06/16 0523)   PRN Meds: albuterol, clonazePAM, HYDROcodone-acetaminophen  Allergies:   No Known Allergies  Social History:   Social History   Social History  . Marital status: Married    Spouse name: N/A  .  Number of children: N/A  . Years of education: N/A   Occupational History  . retired    Social History Main Topics  . Smoking status: Current Every Day Smoker    Packs/day: 0.50    Years: 50.00    Types: Cigarettes  . Smokeless tobacco: Never Used     Comment: Currently smoking 4 cigarettes daily  . Alcohol use No  . Drug use: Yes    Types: Marijuana     Comment: Occasional marijuana use  . Sexual activity: Yes   Other Topics Concern  . Not on file   Social History Narrative   Lives with family    Family History:   The patient's family history includes Diabetes Mellitus II in his brother; Heart disease in his brother and mother; Hyperlipidemia in his brother; Hypertension in his brother; Lung cancer in his brother; Stroke in his father.  ROS:  Please see the history of present illness.  Review of Systems  Constitution: Negative for chills, decreased appetite and fever.  Cardiovascular: Positive for dyspnea on exertion. Negative for chest pain, irregular heartbeat, near-syncope, orthopnea, palpitations, paroxysmal nocturnal dyspnea and syncope.  Respiratory: Positive for cough and shortness of breath. Negative for wheezing.   Hematologic/Lymphatic: Negative for bleeding problem. Does not bruise/bleed easily.  Gastrointestinal: Positive for bloating. Negative for hematochezia, melena, nausea and vomiting.  Genitourinary: Negative for hematuria.    All other ROS reviewed and negative.     Physical Exam/Data:   Vitals:   11/06/16 0451 11/06/16 0457 11/06/16 0755 11/06/16 0800  BP: 113/71     Pulse: (!) 57 95 84   Resp: 18  18   Temp: 98.6 F (37 C)     TempSrc: Oral     SpO2: 98% 90% (!) 88% (!) 88%  Weight: 200 lb 8 oz (90.9 kg)     Height:        Intake/Output Summary (Last 24 hours) at 11/06/16 1336 Last data filed at 11/06/16 1228  Gross per 24 hour  Intake              740 ml  Output             2320 ml  Net            -1580 ml   Filed Weights    11/05/16 0535 11/06/16 0451  Weight: 195 lb 12.8 oz (88.8 kg) 200 lb 8 oz (90.9 kg)   Body mass index is 27.19 kg/m.  General:  Well nourished, Caucasian male appearing in no acute distress.  HEENT: normal Lymph: no adenopathy Neck: no JVD Endocrine:  No thryomegaly Vascular: No carotid bruits; FA pulses 2+ bilaterally without bruits  Cardiac:  normal S1, S2; RRR; no murmur.  Lungs:  clear to auscultation bilaterally, scattered rhonchi.  Abd: soft, nontender, no hepatomegaly  Ext: 1+ pitting edema up to knees bilaterally Musculoskeletal:  No deformities, BUE and BLE strength normal and equal Skin: warm and dry  Neuro:  CNs 2-12 intact, no focal abnormalities noted Psych:  Normal affect   EKG:  The EKG was personally reviewed and demonstrates: Sinus tachycardia, HR 103, with LVH and lateral TWI.  Relevant CV Studies:  Echocardiogram: 11/02/2016 Study Conclusions  - Left ventricle: The cavity size was normal. There was mild   concentric hypertrophy. Systolic function was moderately reduced.   The estimated ejection fraction was in the range of 35% to 40%.   Diffuse hypokinesis. Doppler parameters are consistent with   abnormal left ventricular relaxation (grade 1 diastolic   dysfunction). - Right atrium: The atrium was mildly dilated. - Pulmonary arteries: Systolic pressure could not be accurately   estimated. - Inferior vena cava: The vessel was dilated. Respirophasic changes   in dimension were absent, consistent with elevated central venous   pressure. - Pericardium, extracardiac: Possible left pleural effusion.  Laboratory Data:  Chemistry Recent Labs Lab 11/04/16 2357 11/05/16 0838 11/06/16 0554  NA 137 140 136  K 3.9 4.1 4.3  CL 103 103 99*  CO2 28 28 30   GLUCOSE 178* 126* 399*  BUN 11 12 18   CREATININE 1.42* 1.40* 1.41*  CALCIUM 8.6* 8.7* 8.2*  GFRNONAA 50* 50* 50*  GFRAA 58* 59* 58*  ANIONGAP 6 9 7      Recent Labs Lab 11/06/16 0554  PROT 6.3*    ALBUMIN 2.9*  AST 28  ALT 31  ALKPHOS 103  BILITOT 0.6   Hematology Recent Labs Lab 11/04/16 2357 11/06/16 0554  WBC 8.8 12.0*  RBC 4.48 4.35  HGB 14.0 13.5  HCT 44.8 44.1  MCV 100.0 101.4*  MCH 31.3 31.0  MCHC 31.3 30.6  RDW 16.8* 16.3*  PLT 181 159   Cardiac Enzymes Recent Labs Lab 11/05/16 0613 11/05/16 1156 11/05/16 1807  TROPONINI 0.03* 0.04* 0.03*    Recent Labs Lab 11/05/16 0007  TROPIPOC 0.03    BNP Recent Labs Lab 11/04/16 2357  BNP 370.4*    DDimer No results for input(s): DDIMER in the last 168 hours.  Radiology/Studies:  Dg Chest 2 View  Result Date: 11/05/2016 CLINICAL DATA:  66 year old male with shortness of breath. EXAM: CHEST  2 VIEW COMPARISON:  Chest radiograph dated 10/29/2016 FINDINGS: There has been interval worsening of the airspace disease primarily involving the left mid to lower lung field. Although findings may be infectious in etiology, a left hilar mass is not expanded excluded. Clinical correlation is recommended. CT of the chest with contrast may provide better evaluation. There is a small left pleural effusion. Stable cardiac silhouette. There is pectus excavatum deformity. No acute osseous pathology. IMPRESSION: Interval worsening of the left mid to lower lung field airspace disease well increase in the size of the left pleural effusion. Findings may be infectious in etiology. However, underlying hilar mass is not entirely excluded. Clinical correlation is recommended. CT of the chest with contrast may provide better evaluation. Electronically Signed   By: Anner Crete M.D.   On: 11/05/2016 01:16   Ct Chest Wo Contrast  Result Date: 11/05/2016 CLINICAL DATA:  66 year old male with possible left hilar mass. EXAM: CT CHEST WITHOUT CONTRAST TECHNIQUE: Multidetector CT imaging of the chest was performed following the standard protocol without IV contrast. COMPARISON:  Chest radiograph dated 11/05/2016 FINDINGS: Evaluation of this  exam is limited in the absence of intravenous contrast. Cardiovascular: There is mild  cardiomegaly. No pericardial effusion. There is coronary vascular calcification primarily involving the LAD, left circumflex artery. There is mild atherosclerotic calcification of the thoracic aorta. Evaluation of the aorta and pulmonary arteries is limited in the absence of intravenous contrast. Mediastinum/Nodes: Multiple top-normal mediastinal lymph nodes noted. No definite adenopathy. Evaluation of the left hilum is limited due to consolidative changes of the adjacent lung. Esophagus is grossly unremarkable. No thyroid nodules noted. Lungs/Pleura: There is a small left pleural effusion. Left lower lobe consolidative changes may represent atelectasis versus infiltrate. There is consolidative changes of the left upper lobe and the lingula. There is abrupt termination of the left upper lobe bronchus. Findings may represent postobstructive atelectasis versus pneumonia. There is fullness of the left hilar soft tissue concerning for underlying mass. Evaluation of the hilum is limited on this noncontrast CT. Correlation with clinical exam and follow-up to resolution or further evaluation with CT with IV contrast recommended. There is background of emphysema. Right lung base atelectasis/scarring noted. Upper Abdomen: No acute abnormality. Musculoskeletal: There is pectus deformity.  No acute fracture. IMPRESSION: 1. Apparent abrupt termination of the left upper lobe bronchus with large area consolidative changes in the left upper lobe and primarily lingula. Findings may represent postobstructive atelectasis versus pneumonia. There is fullness of the left hilar soft tissue concerning for a hilar mass. Evaluation is very limited on this noncontrast CT. Follow-up to resolution or further evaluation with CT with IV contrast recommended. 2. Small left pleural effusion and left lower lobe consolidative changes which may represent atelectasis  versus infiltrate. 3. Mild cardiomegaly. 4. Multiple mildly prominent mediastinal lymph nodes Electronically Signed   By: Anner Crete M.D.   On: 11/05/2016 02:25    Assessment and Plan:   1. Acute Combined Systolic and Diastolic CHF - reports worsening dyspnea for the past 2 months and currently admitted with PNA and COPD exacerbation. Notes associated edema and He denies any recent chest pain or palpitations but does report his HR has been elevated into the 90's - 120's when checked at home.  - BNP at 370. Cyclic troponin values have been flat at 0.03-->0.04-->0.03. - recent echo on 7/12 showed a reduced EF of 35-40% with Grade 1 DD and diffuse HK. Unknown etiology as ischemic vs. tachycardia-induced but most concerning for an ischemic etiology.  - he does have multiple cardiac risk factors including HTN, HLD, tobacco use, and an extensive family history of CAD (mother having a massive MI in her 80's, brother having CABG in his 77's).  - with his newly reduced EF, a cardiac catheterization would be most beneficial for definitive evaluation. His wife is hesitant to this due to the use of contrast in the setting of his Stage 3 CKD. We reviewed the precautions which are taken for the procedure and that his creatinine is close to baseline. Will discuss further with MD.  - he has been started on Lopressor 25mg  BID (cardioselective BB favored with his known lung disease). Also remains on PTA Hydralazine 10mg  Q8H (may need to hold in the setting of borderline hypotension). Continue with IV Lasix. Sodium and fluid restriction reviewed with the patient and his family.   2. HTN - BP soft at 93/62 - 126/86 in the past 24 hours. - has been started on Lopressor 25mg  BID in the setting of his new cardiomyopathy. May need to hold PTA Hydralazine as above.   3. HLD - Lipid Panel in 10/2015 showed total cholesterol of 99, triglycerides 48, HDL 37, and LDL  52. - continue Atorvastatin 20mg  every other day as  on PTA.   4. Stage 3 CKD - baseline 1.3 - 1.4. He reports one kidney is functioning at 3% and the other has multiple kidney stones.  - continue to follow closely while receiving IV Lasix.   5. Left Hilar Mass/ Left Pleural Effusion - thoracentesis performed on 7/16 with 800 cc of fluid removed. Awaiting fluid analysis.  - Pulmonology following.    6. Tobacco Use - cessation advised.    Signed, Erma Heritage, PA-C  11/06/2016 1:36 PM  I have seen and examined the patient along with Erma Heritage, PA-C.  I have reviewed the chart, notes and new data.  I agree with PA's note.  Key new complaints: describes orthopnea and PND leading up to this admission, but no angina Key examination changes: dullness left base, otherwise clear lungs, emphysematous chest, no JVD or edema (compression stockings) Key new findings / data: creatinine at baseline, GFR approx 50. Reviewed chest CT - relatively mild coronary calcification for an elderly male smoker. ECG shows relatively mild ST changes, most obvious in V6.  PLAN: Need coronary angiography to identify etiology of cardiomyopathy. I do not think a stress test would offer enough confidence if it is normal. This procedure has been fully reviewed with the patient and written informed consent has been obtained. Plan diagnostic angiography only, both due to mild CKD and to allow time for a diagnosis of possible lung malignancy. Would avoid commitment to dual antiplatelet therapy until we are sure he does not need invasive procedures for the lung disease. Hydrate very gently for cath. After cath, plan gradual titration of ARB and selective beta blocker for cardiomyopathy, regardless of etiology and revascularization plans.  Sanda Klein, MD, [place] [phone] 11/06/2016, 3:40 PM

## 2016-11-06 NOTE — Progress Notes (Signed)
Name: Kevin Gutierrez MRN: [id number] DOB: 1951/02/10    ADMISSION DATE:  11/05/2016 CONSULTATION DATE:  7/15 REFERRING MD :  Maudie Mercury  CHIEF COMPLAINT: abnormal CT chest. Also effusion   BRIEF PATIENT DESCRIPTION:   This is a 67 year old male f/b [place] for chronic respiratory failure in setting of COPD (on home oxygen), ckd stage III, and HFrEF (35-40%) and also has h/o basal cell carcinoma. Presented to ED w/ what he describes as progressive dyspnea since May 2018. Had been started on Levaquin on 7/9 for pneumonia w/ planned 10d course. In the ER pulse ox 67% on 2 liters, nml wbc ct, afebrile but CT chest raised concern for LUL consolidation w/ abrupt termination of the lul. Also concern for hilar mass and LLL consolidation w/ element of effusion. PCCM was asked to see re: this abnormal CT chest   SIGNIFICANT EVENTS    STUDIES:  CT chest 7/15: There is a small left pleural effusion. Left lower lobe consolidative changes may represent atelectasis versus infiltrate. There is consolidative changes of the left upper lobe and the lingula. There is abrupt termination of the left upper lobe bronchus. Findings may represent postobstructive atelectasis versus pneumonia. There is fullness of the left hilar soft tissue concerning for underlying mass. Evaluation of the hilum is limited on this noncontrast CT. Correlation with clinical exam and follow-up to resolution or further evaluation with CT with IV contrast recommended. There is background of emphysema. Right lung base atelectasis/scarring noted.  SUBJECTIVE:  Feels with same, wearing O2 by nasal cannula  VITAL SIGNS: Temp:  [98 F (36.7 C)-98.6 F (37 C)] 98.6 F (37 C) (07/16 0451) Pulse Rate:  [57-99] 84 (07/16 0755) Resp:  [17-18] 18 (07/16 0755) BP: (103-126)/(67-86) 113/71 (07/16 0451) SpO2:  [88 %-98 %] 88 % (07/16 0800) Weight:  [90.9 kg (200 lb 8 oz)] 90.9 kg (200 lb 8 oz) (07/16 0451)  PHYSICAL EXAMINATION: General appearance:   Well-appearing gentleman, no distress sitting up at bedside to eat lunch Eyes: Pupils equal, reactive. No icterus Mouth:  Oropharynx clear, no lesions Neck: No JVD, no stridor Lungs/chest: Decreased breath sounds on the left, good air movement and normal breath sounds on the right CV: Regular, no murmur Abdomen: Soft, benign, positive bowel sounds Extremities: 1+ bilateral pretibial edema Skin:  No rash   Recent Labs Lab 11/04/16 2357 11/05/16 0838 11/06/16 0554  NA 137 140 136  K 3.9 4.1 4.3  CL 103 103 99*  CO2 28 28 30   BUN 11 12 18   CREATININE 1.42* 1.40* 1.41*  GLUCOSE 178* 126* 399*    Recent Labs Lab 11/04/16 2357 11/06/16 0554  HGB 14.0 13.5  HCT 44.8 44.1  WBC 8.8 12.0*  PLT 181 159   Dg Chest 2 View  Result Date: 11/05/2016 CLINICAL DATA:  66 year old male with shortness of breath. EXAM: CHEST  2 VIEW COMPARISON:  Chest radiograph dated 10/29/2016 FINDINGS: There has been interval worsening of the airspace disease primarily involving the left mid to lower lung field. Although findings may be infectious in etiology, a left hilar mass is not expanded excluded. Clinical correlation is recommended. CT of the chest with contrast may provide better evaluation. There is a small left pleural effusion. Stable cardiac silhouette. There is pectus excavatum deformity. No acute osseous pathology. IMPRESSION: Interval worsening of the left mid to lower lung field airspace disease well increase in the size of the left pleural effusion. Findings may be infectious in etiology. However, underlying hilar  mass is not entirely excluded. Clinical correlation is recommended. CT of the chest with contrast may provide better evaluation. Electronically Signed   By: Anner Crete M.D.   On: 11/05/2016 01:16   Ct Chest Wo Contrast  Result Date: 11/05/2016 CLINICAL DATA:  66 year old male with possible left hilar mass. EXAM: CT CHEST WITHOUT CONTRAST TECHNIQUE: Multidetector CT imaging of the  chest was performed following the standard protocol without IV contrast. COMPARISON:  Chest radiograph dated 11/05/2016 FINDINGS: Evaluation of this exam is limited in the absence of intravenous contrast. Cardiovascular: There is mild cardiomegaly. No pericardial effusion. There is coronary vascular calcification primarily involving the LAD, left circumflex artery. There is mild atherosclerotic calcification of the thoracic aorta. Evaluation of the aorta and pulmonary arteries is limited in the absence of intravenous contrast. Mediastinum/Nodes: Multiple top-normal mediastinal lymph nodes noted. No definite adenopathy. Evaluation of the left hilum is limited due to consolidative changes of the adjacent lung. Esophagus is grossly unremarkable. No thyroid nodules noted. Lungs/Pleura: There is a small left pleural effusion. Left lower lobe consolidative changes may represent atelectasis versus infiltrate. There is consolidative changes of the left upper lobe and the lingula. There is abrupt termination of the left upper lobe bronchus. Findings may represent postobstructive atelectasis versus pneumonia. There is fullness of the left hilar soft tissue concerning for underlying mass. Evaluation of the hilum is limited on this noncontrast CT. Correlation with clinical exam and follow-up to resolution or further evaluation with CT with IV contrast recommended. There is background of emphysema. Right lung base atelectasis/scarring noted. Upper Abdomen: No acute abnormality. Musculoskeletal: There is pectus deformity.  No acute fracture. IMPRESSION: 1. Apparent abrupt termination of the left upper lobe bronchus with large area consolidative changes in the left upper lobe and primarily lingula. Findings may represent postobstructive atelectasis versus pneumonia. There is fullness of the left hilar soft tissue concerning for a hilar mass. Evaluation is very limited on this noncontrast CT. Follow-up to resolution or further  evaluation with CT with IV contrast recommended. 2. Small left pleural effusion and left lower lobe consolidative changes which may represent atelectasis versus infiltrate. 3. Mild cardiomegaly. 4. Multiple mildly prominent mediastinal lymph nodes Electronically Signed   By: Anner Crete M.D.   On: 11/05/2016 02:25   abx Ceftriaxone 7/14>>> vanc 7/15>>> azith 7/15>>>  ASSESSMENT / PLAN:  Acute on chronic hypoxic respiratory failure in setting of CAP vs post-obstructive atx +/- pna. Possible hilar lung mass Mod-large Left effusion (Verified by [place] chest 7/15) COPD w/ AECOPD  HFrEF (35-40%) Stage III CKD LE edema   Discussion  66 year old smoker admitted with acute on chronic hypoxemic respiratory failure in the setting of an abnormal CT scan of the chest concerning for left upper lobe pneumonia possible postobstructive process and an associated left effusion. Based on the CT scan I'm concerned that this represents an endobronchial lesion. We will perform a left thoracentesis today, and I will work on arranging possible bronchoscopy assuming cytology does not give a diagnosis.  Plan Continue current antibiotics Diagnostic and therapeutic thoracentesis today Probable bronchoscopy to evaluate for left upper lobe endobronchial lesion  Baltazar Apo, MD, PhD 11/06/2016, 1:41 PM [facility] [phone] or if no answer [phone]

## 2016-11-06 NOTE — Progress Notes (Signed)
2nd IV site was attempted and IV team called to get 2nd site patient refused. Arthor Captain LPN

## 2016-11-06 NOTE — Progress Notes (Signed)
Patient at rest on 3L/[place] was 95%. Ambulated the patient in the hall on 3L/[place] and he never dropped below 92%.

## 2016-11-06 NOTE — Progress Notes (Signed)
Pt wife came to the nurses station that pt c/o chest pain, went into the room pt sitting up in the bed vital signs are stable per pt he thinks is not a chest pain but been sore from cough, said pain is not radiating he just feel discomfort asked for tylenol, gave him one tablet of norco, pt has been advised to call if he feel and changes will continue to monitor pt

## 2016-11-06 NOTE — Progress Notes (Addendum)
PROGRESS NOTE    NISHAAN STANKE   FTD:[id number]  DOB: [birth year]  DOA: 11/05/2016 PCP: Coral Spikes, DO   Brief Narrative:  JERRIT HOREN is a 66 y/o with COPD on 2 L O2 at home, ongoing smoking, found to have EF of 35-40% on ECHO on 7/12, CKD 3, HTN who presented to the ER for progressive dyspnea over the past few months.  In ER, CXR reveals possible L perihilar mass. CT chest reveals abrupt termination of LUL bronch into area of consolidation in LUL and lingula, ? Hilar mass, small L pleural effusion with LLL consolidation, mildly preminent mediastinal nodes and mild cardiomegaly. Pulse ox in ER 67% on 2 L The patient has also been dealing with lower extremity edema for a few weeks and his Norvasc was changed to Hydralazine. This did not improve his pedal edema and an ECHO was performed. He was told he may have some heart blockages.   Subjective: Dyspnea continues to improve. Has cough which is dry but nagging him and causing chest pain. He remembers seeing me and talking to me but does not remember the part of our conversation from yesterday regarding the abnormal ECHO, poor "sqeeze of his heart" and treatment plan with medications that will make him urinate a lot and a cardiology consult. Wife at bedside states is normal for him to forget things this way.  ROS: no complaints of nausea, vomiting, constipation diarrhea, cough, dyspnea or dysuria. No other complaints.   Assessment & Plan:    Principal Problem: Acute on chronic respiratory failure - COPD on 2 L O2 Hilar mass L pleural effusion - pulmonary consulted to decide on bronchoscopy-therapeutic and diagnostic thoracentesis done today- 800 cc of cloudy fluid removed - covering for pneumonia - states cough is bothersome today- start Tessalon and Delsym - dyspnea has improved- suspect he had COPD exacerbation- wean steroids and cont nebs  Active Problems:  Mild troponin elevation - following- likely cardiac strain from  respiratory distress  Cardiomyopathy with EF of 35-40 % and grade 1dCHF 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 - needs cardiac eval - have called them today - started cardioselective B Blockade with Metoprolol and IV Lasix   - I and O, daily weights - start ACE I later    Essential hypertension  - hold Norvasc- cont Hydralazine    CKD (chronic kidney disease) stage 3, GFR 30-59 ml/min  - stable  DVT prophylaxis: Heparin Code Status: full code Family Communication: wife Disposition Plan: home when stable Consultants:   PCCM  Cardiology Procedures:   7/16- thoracentesis- left sided  Antimicrobials:  Anti-infectives    Start     Dose/Rate Route Frequency Ordered Stop   11/06/16 1000  azithromycin (ZITHROMAX) tablet 500 mg     500 mg Oral Daily 11/06/16 0912     11/05/16 1830  vancomycin (VANCOCIN) IVPB 750 mg/150 ml premix  Status:  Discontinued     750 mg 150 mL/hr over 60 Minutes Intravenous Every 12 hours 11/05/16 0536 11/05/16 1220   11/05/16 0800  azithromycin (ZITHROMAX) 500 mg in dextrose 5 % 250 mL IVPB  Status:  Discontinued     500 mg 250 mL/hr over 60 Minutes Intravenous Every 24 hours 11/05/16 0522 11/06/16 0912   11/05/16 0600  cefTRIAXone (ROCEPHIN) 1 g in dextrose 5 % 50 mL IVPB     1 g 100 mL/hr over 30 Minutes Intravenous Every 24 hours 11/05/16 0522     11/05/16  0600  vancomycin (VANCOCIN) 1,500 mg in sodium chloride 0.9 % 500 mL IVPB     1,500 mg 250 mL/hr over 120 Minutes Intravenous  Once 11/05/16 0534 11/05/16 0926       Objective: Vitals:   11/06/16 0457 11/06/16 0755 11/06/16 0800 11/06/16 1401  BP:    93/62  Pulse: 95 84  72  Resp:  18  14  Temp:    98.2 F (36.8 C)  TempSrc:    Oral  SpO2: 90% (!) 88% (!) 88% 93%  Weight:      Height:        Intake/Output Summary (Last 24 hours) at 11/06/16 1413 Last data filed at 11/06/16 1228  Gross per 24 hour  Intake              740 ml  Output             2320 ml    Net            -1580 ml   Filed Weights   11/05/16 0535 11/06/16 0451  Weight: 88.8 kg (195 lb 12.8 oz) 90.9 kg (200 lb 8 oz)    Examination: General exam: Appears comfortable  HEENT: PERRLA, oral mucosa moist, no sclera icterus or thrush Respiratory system: left sided crackles. No wheezing. Respiratory effort normal. Cardiovascular system: S1 & S2 heard, RRR.  No murmurs  Gastrointestinal system: Abdomen soft, non-tender, nondistended. Normal bowel sound. No organomegaly Central nervous system: Alert and oriented. No focal neurological deficits. Extremities: No cyanosis, clubbing - 2 + pitting edema Skin: No rashes or ulcers Psychiatry:  Mood & affect appropriate.     Data Reviewed: I have personally reviewed following labs and imaging studies  CBC:  Recent Labs Lab 11/04/16 2357 11/06/16 0554  WBC 8.8 12.0*  NEUTROABS 6.2  --   HGB 14.0 13.5  HCT 44.8 44.1  MCV 100.0 101.4*  PLT 181 710   Basic Metabolic Panel:  Recent Labs Lab 11/04/16 2357 11/05/16 0838 11/06/16 0554  NA 137 140 136  K 3.9 4.1 4.3  CL 103 103 99*  CO2 28 28 30   GLUCOSE 178* 126* 399*  BUN 11 12 18   CREATININE 1.42* 1.40* 1.41*  CALCIUM 8.6* 8.7* 8.2*   GFR: Estimated Creatinine Clearance: 55.8 mL/min (A) (by C-G formula based on SCr of 1.41 mg/dL (H)). Liver Function Tests:  Recent Labs Lab 11/06/16 0554  AST 28  ALT 31  ALKPHOS 103  BILITOT 0.6  PROT 6.3*  ALBUMIN 2.9*   No results for input(s): LIPASE, AMYLASE in the last 168 hours. No results for input(s): AMMONIA in the last 168 hours. Coagulation Profile: No results for input(s): INR, PROTIME in the last 168 hours. Cardiac Enzymes:  Recent Labs Lab 11/05/16 0613 11/05/16 1156 11/05/16 1807  TROPONINI 0.03* 0.04* 0.03*   BNP (last 3 results) No results for input(s): PROBNP in the last 8760 hours. HbA1C: No results for input(s): HGBA1C in the last 72 hours. CBG: No results for input(s): GLUCAP in the last 168  hours. Lipid Profile: No results for input(s): CHOL, HDL, LDLCALC, TRIG, CHOLHDL, LDLDIRECT in the last 72 hours. Thyroid Function Tests: No results for input(s): TSH, T4TOTAL, FREET4, T3FREE, THYROIDAB in the last 72 hours. Anemia Panel: No results for input(s): VITAMINB12, FOLATE, FERRITIN, TIBC, IRON, RETICCTPCT in the last 72 hours. Urine analysis:    Component Value Date/Time   COLORURINE YELLOW (A) 07/22/2016 0748   APPEARANCEUR CLEAR (A) 07/22/2016 0748   APPEARANCEUR  Clear 03/15/2016 0853   LABSPEC 1.009 07/22/2016 0748   PHURINE 6.0 07/22/2016 0748   GLUCOSEU NEGATIVE 07/22/2016 0748   HGBUR MODERATE (A) 07/22/2016 0748   BILIRUBINUR NEGATIVE 07/22/2016 0748   BILIRUBINUR Negative 03/15/2016 [place] 07/22/2016 0748   PROTEINUR 30 (A) 07/22/2016 0748   NITRITE NEGATIVE 07/22/2016 0748   LEUKOCYTESUR NEGATIVE 07/22/2016 0748   LEUKOCYTESUR Negative 03/15/2016 0853   Sepsis Labs: @LABRCNTIP (procalcitonin:4,lacticidven:4) )No results found for this or any previous visit (from the past 240 hour(s)).       Radiology Studies: Dg Chest 2 View  Result Date: 11/05/2016 CLINICAL DATA:  66 year old male with shortness of breath. EXAM: CHEST  2 VIEW COMPARISON:  Chest radiograph dated 10/29/2016 FINDINGS: There has been interval worsening of the airspace disease primarily involving the left mid to lower lung field. Although findings may be infectious in etiology, a left hilar mass is not expanded excluded. Clinical correlation is recommended. CT of the chest with contrast may provide better evaluation. There is a small left pleural effusion. Stable cardiac silhouette. There is pectus excavatum deformity. No acute osseous pathology. IMPRESSION: Interval worsening of the left mid to lower lung field airspace disease well increase in the size of the left pleural effusion. Findings may be infectious in etiology. However, underlying hilar mass is not entirely excluded.  Clinical correlation is recommended. CT of the chest with contrast may provide better evaluation. Electronically Signed   By: Anner Crete M.D.   On: 11/05/2016 01:16   Ct Chest Wo Contrast  Result Date: 11/05/2016 CLINICAL DATA:  66 year old male with possible left hilar mass. EXAM: CT CHEST WITHOUT CONTRAST TECHNIQUE: Multidetector CT imaging of the chest was performed following the standard protocol without IV contrast. COMPARISON:  Chest radiograph dated 11/05/2016 FINDINGS: Evaluation of this exam is limited in the absence of intravenous contrast. Cardiovascular: There is mild cardiomegaly. No pericardial effusion. There is coronary vascular calcification primarily involving the LAD, left circumflex artery. There is mild atherosclerotic calcification of the thoracic aorta. Evaluation of the aorta and pulmonary arteries is limited in the absence of intravenous contrast. Mediastinum/Nodes: Multiple top-normal mediastinal lymph nodes noted. No definite adenopathy. Evaluation of the left hilum is limited due to consolidative changes of the adjacent lung. Esophagus is grossly unremarkable. No thyroid nodules noted. Lungs/Pleura: There is a small left pleural effusion. Left lower lobe consolidative changes may represent atelectasis versus infiltrate. There is consolidative changes of the left upper lobe and the lingula. There is abrupt termination of the left upper lobe bronchus. Findings may represent postobstructive atelectasis versus pneumonia. There is fullness of the left hilar soft tissue concerning for underlying mass. Evaluation of the hilum is limited on this noncontrast CT. Correlation with clinical exam and follow-up to resolution or further evaluation with CT with IV contrast recommended. There is background of emphysema. Right lung base atelectasis/scarring noted. Upper Abdomen: No acute abnormality. Musculoskeletal: There is pectus deformity.  No acute fracture. IMPRESSION: 1. Apparent abrupt  termination of the left upper lobe bronchus with large area consolidative changes in the left upper lobe and primarily lingula. Findings may represent postobstructive atelectasis versus pneumonia. There is fullness of the left hilar soft tissue concerning for a hilar mass. Evaluation is very limited on this noncontrast CT. Follow-up to resolution or further evaluation with CT with IV contrast recommended. 2. Small left pleural effusion and left lower lobe consolidative changes which may represent atelectasis versus infiltrate. 3. Mild cardiomegaly. 4. Multiple mildly prominent mediastinal lymph nodes  Electronically Signed   By: Anner Crete M.D.   On: 11/05/2016 02:25      Scheduled Meds: . aspirin EC  81 mg Oral Daily  . atorvastatin  20 mg Oral QODAY  . azithromycin  500 mg Oral Daily  . benzonatate  100 mg Oral TID  . dextromethorphan  15 mg Oral BID  . fluticasone furoate-vilanterol  1 puff Inhalation Daily  . furosemide  40 mg Intravenous BID  . heparin  5,000 Units Subcutaneous Q8H  . hydrALAZINE  10 mg Oral Q8H  . methylPREDNISolone (SOLU-MEDROL) injection  80 mg Intravenous Q12H  . metoprolol tartrate  25 mg Oral BID  . pantoprazole  40 mg Oral Daily  . tiotropium  18 mcg Inhalation Daily   Continuous Infusions: . cefTRIAXone (ROCEPHIN)  IV 1 g (11/06/16 0523)     LOS: 1 day    Time spent in minutes: 35    Debbe Odea, MD [facility] Pager: [email or web address] Password [email or web address] 11/06/2016, 2:13 PM

## 2016-11-06 NOTE — Procedures (Signed)
Thoracentesis Procedure Note  Pre-operative Diagnosis: left pleural effusion   Post-operative Diagnosis: same  Indications: evaluation and drainage of left pleural fluid   Procedure Details  Consent: Informed consent was obtained. Risks of the procedure were discussed including: infection, bleeding, pain, pneumothorax.  Under sterile conditions the patient was positioned. Betadine solution and sterile drapes were utilized.  1% buffered lidocaine was used to anesthetize the  rib space posteriorly which was evaluated using real time Ultra sound. Fluid was obtained without any difficulties and minimal blood loss.  A dressing was applied to the wound and wound care instructions were provided.   Findings 800  ml of cloudy pleural fluid was obtained. A sample was sent to Pathology for cytogenetics, flow, and cell counts, as well as for infection analysis.  Complications:  None; patient tolerated the procedure well.          Condition: stable  Plan A follow up chest x-ray was ordered.  Erick Colace ACNP-BC [place] Pager # [phone] RF # [phone] if no answer

## 2016-11-07 ENCOUNTER — Encounter (HOSPITAL_COMMUNITY)
Admission: EM | Disposition: A | Discharge status: Home / Self Care | Payer: Self-pay | Source: Home / Self Care | Attending: Internal Medicine

## 2016-11-07 ENCOUNTER — Inpatient Hospital Stay (HOSPITAL_COMMUNITY): Payer: PPO

## 2016-11-07 DIAGNOSIS — I25118 Atherosclerotic heart disease of native coronary artery with other forms of angina pectoris: Secondary | ICD-10-CM

## 2016-11-07 DIAGNOSIS — I2510 Atherosclerotic heart disease of native coronary artery without angina pectoris: Secondary | ICD-10-CM

## 2016-11-07 DIAGNOSIS — I509 Heart failure, unspecified: Secondary | ICD-10-CM

## 2016-11-07 DIAGNOSIS — J9611 Chronic respiratory failure with hypoxia: Secondary | ICD-10-CM

## 2016-11-07 DIAGNOSIS — I5021 Acute systolic (congestive) heart failure: Secondary | ICD-10-CM

## 2016-11-07 HISTORY — PX: LEFT HEART CATH AND CORONARY ANGIOGRAPHY: CATH118249

## 2016-11-07 LAB — BASIC METABOLIC PANEL
ANION GAP: 9 (ref 5–15)
BUN: 29 mg/dL — ABNORMAL HIGH (ref 6–20)
CHLORIDE: 99 mmol/L — AB (ref 101–111)
CO2: 28 mmol/L (ref 22–32)
Calcium: 8.8 mg/dL — ABNORMAL LOW (ref 8.9–10.3)
Creatinine, Ser: 1.41 mg/dL — ABNORMAL HIGH (ref 0.61–1.24)
GFR calc non Af Amer: 50 mL/min — ABNORMAL LOW (ref >60)
GFR, EST AFRICAN AMERICAN: 58 mL/min — AB (ref >60)
Glucose, Bld: 230 mg/dL — ABNORMAL HIGH (ref 65–99)
Potassium: 4.5 mmol/L (ref 3.5–5.1)
Sodium: 136 mmol/L (ref 135–145)

## 2016-11-07 LAB — GLUCOSE, CAPILLARY
Glucose-Capillary: 188 mg/dL — ABNORMAL HIGH (ref 65–99)
Glucose-Capillary: 196 mg/dL — ABNORMAL HIGH (ref 65–99)
Glucose-Capillary: 150 mg/dL — ABNORMAL HIGH (ref 65–99)

## 2016-11-07 LAB — HEMOGLOBIN A1C
HEMOGLOBIN A1C: 7.1 % — AB (ref 4.8–5.6)
Mean Plasma Glucose: 157 mg/dL

## 2016-11-07 LAB — CBC
HCT: 45.3 % (ref 39.0–52.0)
HEMOGLOBIN: 13.9 g/dL (ref 13.0–17.0)
MCH: 31 pg (ref 26.0–34.0)
MCHC: 30.7 g/dL (ref 30.0–36.0)
MCV: 100.9 fL — AB (ref 78.0–100.0)
Platelets: 205 10*3/uL (ref 150–400)
RBC: 4.49 MIL/uL (ref 4.22–5.81)
RDW: 16.7 % — ABNORMAL HIGH (ref 11.5–15.5)
WBC: 17.1 10*3/uL — ABNORMAL HIGH (ref 4.0–10.5)

## 2016-11-07 SURGERY — LEFT HEART CATH AND CORONARY ANGIOGRAPHY
Anesthesia: LOCAL

## 2016-11-07 SURGERY — CANCELLED PROCEDURE
Laterality: Bilateral

## 2016-11-07 MED ORDER — CLONAZEPAM 0.5 MG PO TABS: 0.2500 mg | ORAL_TABLET | Freq: Two times a day (BID) | ORAL | Status: DC | PRN

## 2016-11-07 MED ORDER — FENTANYL CITRATE (PF) 100 MCG/2ML IJ SOLN
INTRAMUSCULAR | Status: AC
  Filled 2016-11-07: qty 2

## 2016-11-07 MED ORDER — MIDAZOLAM HCL 2 MG/2ML IJ SOLN
INTRAMUSCULAR | Status: DC | PRN
  Administered 2016-11-07: 1 mg via INTRAVENOUS

## 2016-11-07 MED ORDER — PHENYLEPHRINE HCL 0.25 % NA SOLN
NASAL | Status: DC | PRN
  Administered 2016-11-07: 2 via NASAL

## 2016-11-07 MED ORDER — VERAPAMIL HCL 2.5 MG/ML IV SOLN
INTRAVENOUS | Status: AC
  Filled 2016-11-07: qty 2

## 2016-11-07 MED ORDER — SODIUM CHLORIDE 0.9 % WEIGHT BASED INFUSION
1.0000 mL/kg/h | INTRAVENOUS | Status: AC
  Administered 2016-11-07: 19:00:00 1 mL/kg/h via INTRAVENOUS

## 2016-11-07 MED ORDER — HEPARIN SODIUM (PORCINE) 1000 UNIT/ML IJ SOLN
INTRAMUSCULAR | Status: AC
  Filled 2016-11-07: qty 1

## 2016-11-07 MED ORDER — LORAZEPAM 2 MG/ML IJ SOLN
0.5000 mg | Freq: Four times a day (QID) | INTRAMUSCULAR | Status: DC | PRN
  Administered 2016-11-07: 1 mg via INTRAVENOUS
  Filled 2016-11-07: qty 1

## 2016-11-07 MED ORDER — SODIUM CHLORIDE 0.9 % IV SOLN: 250.0000 mL | INTRAVENOUS | Status: DC | PRN

## 2016-11-07 MED ORDER — IOPAMIDOL (ISOVUE-370) INJECTION 76%
INTRAVENOUS | Status: AC
  Filled 2016-11-07: qty 100

## 2016-11-07 MED ORDER — HEPARIN SODIUM (PORCINE) 1000 UNIT/ML IJ SOLN
INTRAMUSCULAR | Status: DC | PRN
  Administered 2016-11-07: 4800 [IU] via INTRAVENOUS

## 2016-11-07 MED ORDER — HEPARIN (PORCINE) IN NACL 2-0.9 UNIT/ML-% IJ SOLN
INTRAMUSCULAR | Status: AC | PRN
  Administered 2016-11-07: 1000 mL via INTRA_ARTERIAL

## 2016-11-07 MED ORDER — ACETAMINOPHEN 325 MG PO TABS: 650.0000 mg | ORAL_TABLET | ORAL | Status: DC | PRN

## 2016-11-07 MED ORDER — FENTANYL CITRATE (PF) 100 MCG/2ML IJ SOLN
INTRAMUSCULAR | Status: DC | PRN
  Administered 2016-11-07: 25 ug via INTRAVENOUS

## 2016-11-07 MED ORDER — HEPARIN (PORCINE) IN NACL 2-0.9 UNIT/ML-% IJ SOLN
INTRAMUSCULAR | Status: AC
  Filled 2016-11-07: qty 500

## 2016-11-07 MED ORDER — INSULIN ASPART 100 UNIT/ML ~~LOC~~ SOLN
0.0000 [IU] | Freq: Every day | SUBCUTANEOUS | Status: DC
  Administered 2016-11-10: 2 [IU] via SUBCUTANEOUS

## 2016-11-07 MED ORDER — METHYLPREDNISOLONE SODIUM SUCC 40 MG IJ SOLR
40.0000 mg | Freq: Two times a day (BID) | INTRAMUSCULAR | Status: DC
  Administered 2016-11-07 – 2016-11-10 (×6): 40 mg via INTRAVENOUS
  Filled 2016-11-07 (×7): qty 1

## 2016-11-07 MED ORDER — INSULIN ASPART 100 UNIT/ML ~~LOC~~ SOLN: 0.0000 [IU] | Freq: Three times a day (TID) | SUBCUTANEOUS | Status: DC

## 2016-11-07 MED ORDER — HEPARIN SODIUM (PORCINE) 5000 UNIT/ML IJ SOLN
5000.0000 [IU] | Freq: Three times a day (TID) | INTRAMUSCULAR | Status: DC
  Administered 2016-11-09 (×2): 5000 [IU] via SUBCUTANEOUS
  Filled 2016-11-07 (×5): qty 1

## 2016-11-07 MED ORDER — ONDANSETRON HCL 4 MG/2ML IJ SOLN: 4.0000 mg | Freq: Four times a day (QID) | INTRAMUSCULAR | Status: DC | PRN

## 2016-11-07 MED ORDER — FENTANYL CITRATE (PF) 100 MCG/2ML IJ SOLN
INTRAMUSCULAR | Status: AC
  Filled 2016-11-07: qty 4

## 2016-11-07 MED ORDER — MIDAZOLAM HCL 2 MG/2ML IJ SOLN
INTRAMUSCULAR | Status: AC
  Filled 2016-11-07: qty 2

## 2016-11-07 MED ORDER — VERAPAMIL HCL 2.5 MG/ML IV SOLN
INTRAVENOUS | Status: DC | PRN
  Administered 2016-11-07: 17:00:00 via INTRA_ARTERIAL

## 2016-11-07 MED ORDER — LIDOCAINE HCL (PF) 1 % IJ SOLN
INTRAMUSCULAR | Status: DC | PRN
  Administered 2016-11-07: 6 mL

## 2016-11-07 MED ORDER — ISOSORBIDE MONONITRATE ER 30 MG PO TB24
30.0000 mg | ORAL_TABLET | Freq: Every day | ORAL | Status: DC
  Administered 2016-11-07 – 2016-11-09 (×2): 30 mg via ORAL
  Filled 2016-11-07 (×2): qty 1

## 2016-11-07 MED ORDER — DIAZEPAM 5 MG PO TABS: 5.0000 mg | ORAL_TABLET | Freq: Four times a day (QID) | ORAL | Status: DC | PRN

## 2016-11-07 MED ORDER — SODIUM CHLORIDE 0.9% FLUSH: 3.0000 mL | INTRAVENOUS | Status: DC | PRN

## 2016-11-07 MED ORDER — LIDOCAINE HCL 2 % EX GEL
CUTANEOUS | Status: DC | PRN
  Administered 2016-11-07: 1

## 2016-11-07 MED ORDER — MIDAZOLAM HCL 5 MG/ML IJ SOLN
INTRAMUSCULAR | Status: AC
  Filled 2016-11-07: qty 2

## 2016-11-07 MED ORDER — LIDOCAINE HCL (PF) 1 % IJ SOLN
INTRAMUSCULAR | Status: DC | PRN
  Administered 2016-11-07: 2 mL

## 2016-11-07 MED ORDER — SODIUM CHLORIDE 0.9% FLUSH
3.0000 mL | Freq: Two times a day (BID) | INTRAVENOUS | Status: DC
  Administered 2016-11-09 – 2016-11-11 (×4): 3 mL via INTRAVENOUS

## 2016-11-07 MED ORDER — INSULIN ASPART 100 UNIT/ML ~~LOC~~ SOLN
0.0000 [IU] | Freq: Three times a day (TID) | SUBCUTANEOUS | Status: DC
  Administered 2016-11-07: 20:00:00 1 [IU] via SUBCUTANEOUS
  Administered 2016-11-07 – 2016-11-08 (×3): 2 [IU] via SUBCUTANEOUS
  Administered 2016-11-08: 3 [IU] via SUBCUTANEOUS
  Administered 2016-11-09 (×3): 2 [IU] via SUBCUTANEOUS
  Administered 2016-11-10: 14:00:00 7 [IU] via SUBCUTANEOUS
  Administered 2016-11-10: 9 [IU] via SUBCUTANEOUS
  Administered 2016-11-11 (×2): 3 [IU] via SUBCUTANEOUS

## 2016-11-07 MED ORDER — IOPAMIDOL (ISOVUE-370) INJECTION 76%
INTRAVENOUS | Status: DC | PRN
  Administered 2016-11-07: 85 mL via INTRA_ARTERIAL

## 2016-11-07 SURGICAL SUPPLY — 11 items

## 2016-11-07 NOTE — Interval H&P Note (Signed)
History and Physical Interval Note:  11/07/2016 5:08 PM  [name]  has presented today for surgery, with the diagnosis of cm  The various methods of treatment have been discussed with the patient and family. After consideration of risks, benefits and other options for treatment, the patient has consented to  Procedure(s): Left Heart Cath and Coronary Angiography (N/A) as a surgical intervention .  The patient's history has been reviewed, patient examined, no change in status, stable for surgery.  I have reviewed the patient's chart and labs.  Questions were answered to the patient's satisfaction.     Shelva Majestic

## 2016-11-07 NOTE — H&P (View-Only) (Signed)
Progress Note  Patient Name: Kevin Gutierrez Date of Encounter: 11/07/2016  Primary Cardiologist: [facility] - Dr. Sallyanne Kuster  Subjective   No chest pain. Main complaint is cough. Had blood tinged sputum earlier today.   Inpatient Medications    Scheduled Meds: . aspirin EC  81 mg Oral Daily  . atorvastatin  20 mg Oral QODAY  . azithromycin  500 mg Oral Daily  . benzonatate  100 mg Oral TID  . dextromethorphan  15 mg Oral BID  . fluticasone furoate-vilanterol  1 puff Inhalation Daily  . heparin  5,000 Units Subcutaneous Q8H  . insulin aspart  0-5 Units Subcutaneous QHS  . insulin aspart  0-9 Units Subcutaneous TID WC  . methylPREDNISolone (SOLU-MEDROL) injection  80 mg Intravenous Q12H  . metoprolol tartrate  25 mg Oral BID  . pantoprazole  40 mg Oral Daily  . sodium chloride flush  3 mL Intravenous Q12H  . tiotropium  18 mcg Inhalation Daily   Continuous Infusions: . sodium chloride    . sodium chloride 50 mL/hr (11/07/16 0601)  . cefTRIAXone (ROCEPHIN)  IV 1 g (11/07/16 0612)   PRN Meds: sodium chloride, albuterol, clonazePAM, HYDROcodone-acetaminophen, sodium chloride flush   Vital Signs    Vitals:   11/07/16 0733 11/07/16 0740 11/07/16 0850 11/07/16 0903  BP:   110/74   Pulse:   83 81  Resp:      Temp:   97.9 F (36.6 C)   TempSrc:   Oral   SpO2: 90%  92%   Weight:  190 lb 9.6 oz (86.5 kg)    Height:        Intake/Output Summary (Last 24 hours) at 11/07/16 0938 Last data filed at 11/07/16 0612  Gross per 24 hour  Intake                0 ml  Output             1295 ml  Net            -1295 ml   Filed Weights   11/05/16 0535 11/06/16 0451 11/07/16 0740  Weight: 195 lb 12.8 oz (88.8 kg) 200 lb 8 oz (90.9 kg) 190 lb 9.6 oz (86.5 kg)    Telemetry    NSR - Personally Reviewed  ECG    NSR - Personally Reviewed  Physical Exam   GEN: No acute distress.   Neck: No JVD Cardiac: RRR, no murmurs, rubs, or gallops.  Respiratory: decraesed BS at the  bases with slight crackles GI: Soft, nontender, non-distended  MS: No edema; No deformity. Neuro:  Nonfocal  Psych: Normal affect   Labs    Chemistry Recent Labs Lab 11/05/16 0838 11/06/16 0554 11/06/16 1703 11/07/16 0551  NA 140 136  --  136  K 4.1 4.3  --  4.5  CL 103 99*  --  99*  CO2 28 30  --  28  GLUCOSE 126* 399*  --  230*  BUN 12 18  --  29*  CREATININE 1.40* 1.41*  --  1.41*  CALCIUM 8.7* 8.2*  --  8.8*  PROT  --  6.3* 6.6  --   ALBUMIN  --  2.9*  --   --   AST  --  28  --   --   ALT  --  31  --   --   ALKPHOS  --  103  --   --   BILITOT  --  0.6  --   --   GFRNONAA 50* 50*  --  50*  GFRAA 59* 58*  --  58*  ANIONGAP 9 7  --  9     Hematology Recent Labs Lab 11/04/16 2357 11/06/16 0554 11/07/16 0551  WBC 8.8 12.0* 17.1*  RBC 4.48 4.35 4.49  HGB 14.0 13.5 13.9  HCT 44.8 44.1 45.3  MCV 100.0 101.4* 100.9*  MCH 31.3 31.0 31.0  MCHC 31.3 30.6 30.7  RDW 16.8* 16.3* 16.7*  PLT 181 159 205    Cardiac Enzymes Recent Labs Lab 11/05/16 0613 11/05/16 1156 11/05/16 1807  TROPONINI 0.03* 0.04* 0.03*    Recent Labs Lab 11/05/16 0007  TROPIPOC 0.03     BNP Recent Labs Lab 11/04/16 2357  BNP 370.4*     DDimer No results for input(s): DDIMER in the last 168 hours.   Radiology    Ct Chest Wo Contrast  Result Date: 11/07/2016 CLINICAL DATA:  66 year old male with progressive dyspnea. Abnormal chest x-ray and CT. Subsequent encounter. EXAM: CT CHEST WITHOUT CONTRAST TECHNIQUE: Multidetector CT imaging of the chest was performed following the standard protocol without IV contrast. COMPARISON:  11/06/2016 chest x-ray. 11/05/2016 and 06/07/2015 chest CT. FINDINGS: Cardiovascular: Pectus deformity with cardiomegaly. Coronary artery calcifications. Atherosclerotic changes thoracic aorta. Ascending thoracic aorta measures up to 4 Cm. Mediastinum/Nodes: Mediastinal adenopathy. Lungs/Pleura: Obstruction left upper lobe bronchus with postobstructive  atelectasis/ infiltrate. Rounded fullness left hilar region raises possibly of obstructing mass (not well delineated on present noncontrast exam). The presence of adjacent mediastinal raises possibility of tumor. Atelectasis with bronchial thickening involving portions of the lower lobes bilaterally and medial aspect of the right middle lobe. Very small left-sided pleural effusion. Upper Abdomen: Slightly lobulated contour of the liver without other findings of cirrhosis. Mild nodularity left adrenal gland. Musculoskeletal: Sclerotic appearance left humeral head suggestive of avascular necrosis. Pectus deformity. Mild degenerative changes thoracic spine with destructive lesion. IMPRESSION: Obstruction left upper lobe bronchus with postobstructive atelectasis/ infiltrate. Rounded fullness left hilar region raises possibly of obstructing mass (not well delineated on present noncontrast exam). The presence of adjacent mediastinal raises possibility of tumor. Atelectasis with bronchial thickening involving portions of the lower lobes bilaterally and medial aspect of the right middle lobe. Very small left-sided pleural effusion. Mild nodularity left adrenal gland. Ascending thoracic aorta measures up to 4 cm. Recommend annual imaging followup by CTA or MRA. This recommendation follows 2010 ACCF/AHA/AATS/ACR/ASA/SCA/SCAI/SIR/STS/SVM Guidelines for the Diagnosis and Management of Patients with Thoracic Aortic Disease. Circulation. 2010; 121: Z610-R604 Sclerotic appearance left humeral head suggestive of avascular necrosis. Pectus deformity with mild cardiomegaly. Aortic Atherosclerosis (ICD10-I70.0). Electronically Signed   By: Genia Del M.D.   On: 11/07/2016 07:18   Dg Chest Port 1 View  Result Date: 11/06/2016 CLINICAL DATA:  Left-sided thoracentesis EXAM: PORTABLE CHEST 1 VIEW COMPARISON:  Yesterday FINDINGS: No visible residual fluid. No visible pneumothorax. There is a known left hilar mass with extensive  upper lobe opacification. Cardiomegaly. Mild atelectasis at the medial right base. IMPRESSION: No acute finding after thoracentesis. Known left upper lobe mass and collapse. Electronically Signed   By: Monte Fantasia M.D.   On: 11/06/2016 14:23    Cardiac Studies  2D Echo 11/02/16 Study Conclusions  - Left ventricle: The cavity size was normal. There was mild   concentric hypertrophy. Systolic function was moderately reduced.   The estimated ejection fraction was in the range of 35% to 40%.   Diffuse hypokinesis. Doppler parameters are consistent with  abnormal left ventricular relaxation (grade 1 diastolic   dysfunction). - Right atrium: The atrium was mildly dilated. - Pulmonary arteries: Systolic pressure could not be accurately   estimated. - Inferior vena cava: The vessel was dilated. Respirophasic changes   in dimension were absent, consistent with elevated central venous   pressure. - Pericardium, extracardiac: Possible left pleural effusion.  Diagnostic LHC - pending    Patient Profile     66 y.o. male smoker with h/o COPD (on home O2), stage III CKD, HTN and HLD, who presented to [facility]/06/14 with worsening dyspnea, found to be in acute on chronic hypoxic respiratory failure with chest CT demonstrating findings concerning for LUL PNA, left pleural effusion and obstruction in the LUL bronchus, concerning for malignancy (bronchoscopy pending). Cardiology consulted for newly reduced EF, down to 35-40%.    Assessment & Plan    1. Cardiomyopathy/ LV Systolic Dysfunction: EF 10-21% by echo. Chest CT with coronary artery calcifications. Given low EF and long h/o tobacco abuse, plan is for LHC this afternoon to exclude underlying CAD as the etiology of his cardiomyopathy. Plan is for diagnostic cath only, given his CAD and also to allow time for a diagnosis of possible lung malignancy/ completion of invasive procedures for lung w/u. Continue BB. No ACE/ARB given CKD. Monitor volume  closely.   2. Lung Mass: Pulmonology following. Bronchoscopy pending.   3. Pleural Effusion: s/p thoracentesis. Per IM and pulmonology.  4. Acute Hypoxic Respiratory Failure: multifactorial. 2/2 to medical problems outlined above.   5. CKD:  SCr 1.41. Plan is for diagnostic cath only for today. Limit contrast exposure. Monitor post cath.   6. COPD: per IM and pulmonology.   Signed, Lyda Jester, PA-C  11/07/2016, 9:38 AM    I have seen and examined the patient along with Lyda Jester, PA-C .  I have reviewed the chart, notes and new data.  I agree with PA/NP's note.  Key new complaints: seen just before bronchoscopy. Lying almost fully flat without distress. Reported hemoptysis. Key examination changes: dullness to percussion and absent breath sounds left base Key new findings / data: stable creat 1.4, baseline (GFR 50)  PLAN: Bronchoscopy this AM. Diagnostic cardiac cath this afternoon. Delay any revascularization until after tissue diagnosis has been confirmed.  Sanda Klein, MD, [place] [phone] 11/07/2016, 10:14 AM

## 2016-11-07 NOTE — Progress Notes (Signed)
Patient brought to bronch suite for bronchoscopy.  Patient prepped per protocol.  Patient then felt that he couldn't breathe, and did not want to proceed with the procedure.  MD called, orders given to cancel the bronchoscopy.  Report called to [place], RN.  Patient NPO until 1230.

## 2016-11-07 NOTE — Progress Notes (Signed)
PCCM Interval Note  Kevin Gutierrez did not go for his planned FOB today. He had relative hypoxemia on 4L/min, also began to become anxious and dyspneic when his throat was anesthetized w lidocaine. We cancelled the procedure. 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 His pleural fluid cytology is pending.   We will arrange for FOB under general anesthesia on 7/18 so that he can be monitored more closely. If his pleural fluid gives a dx or if there is a contraindication for anesthesia based on his L cath this pm then we will cancel the case.   Baltazar Apo, MD, PhD 11/07/2016, 3:16 PM [facility] [phone] or if no answer [phone]

## 2016-11-07 NOTE — Progress Notes (Signed)
Offered to show cardiac cath video in am patient refused. Arthor Captain LPN

## 2016-11-07 NOTE — Anesthesia Preprocedure Evaluation (Addendum)
Anesthesia Evaluation  Patient identified by MRN, date of birth, ID band Patient awake    Reviewed: Allergy & Precautions, H&P , NPO status , Patient's Chart, lab work & pertinent test results  Airway Mallampati: II  TM Distance: >3 FB Neck ROM: Full    Dental no notable dental hx. (+) Edentulous Upper, Edentulous Lower, Dental Advisory Given   Pulmonary COPD,  COPD inhaler, Current Smoker,  Lung mass   Pulmonary exam normal breath sounds clear to auscultation       Cardiovascular Exercise Tolerance: Good hypertension, Pt. on medications + CAD and +CHF   Rhythm:Regular Rate:Normal     Neuro/Psych Anxiety negative neurological ROS  negative psych ROS   GI/Hepatic Neg liver ROS, PUD,   Endo/Other  negative endocrine ROS  Renal/GU Renal InsufficiencyRenal disease  negative genitourinary   Musculoskeletal  (+) Arthritis , Osteoarthritis,    Abdominal   Peds  Hematology negative hematology ROS (+)   Anesthesia Other Findings   Reproductive/Obstetrics negative OB ROS                            Anesthesia Physical Anesthesia Plan  ASA: III  Anesthesia Plan: General   Post-op Pain Management:    Induction: Intravenous  PONV Risk Score and Plan: 1 and Ondansetron and Dexamethasone  Airway Management Planned: Oral ETT  Additional Equipment:   Intra-op Plan:   Post-operative Plan: Extubation in OR and Possible Post-op intubation/ventilation  Informed Consent: I have reviewed the patients History and Physical, chart, labs and discussed the procedure including the risks, benefits and alternatives for the proposed anesthesia with the patient or authorized representative who has indicated his/her understanding and acceptance.   Dental advisory given  Plan Discussed with: CRNA  Anesthesia Plan Comments:        Anesthesia Quick Evaluation

## 2016-11-07 NOTE — Progress Notes (Addendum)
PROGRESS NOTE    CROIX PRESLEY   JQB:[id number]  DOB: Oct 14, 1950  DOA: 11/05/2016 PCP: Coral Spikes, DO   Brief Narrative:  EWING FANDINO is a 66 y/o with COPD on 2 L O2 at home, ongoing smoking, found to have EF of 35-40% on ECHO on 7/12, CKD 3, HTN who presented to the ER for progressive dyspnea over the past few months.  In ER, CXR reveals possible L perihilar mass. CT chest reveals abrupt termination of LUL bronch into area of consolidation in LUL and lingula, ? Hilar mass, small L pleural effusion with LLL consolidation, mildly preminent mediastinal nodes and mild cardiomegaly. Pulse ox in ER 67% on 2 L The patient has also been dealing with lower extremity edema for a few weeks and his Norvasc was changed to Hydralazine. This did not improve his pedal edema and an ECHO was performed. He was told he may have some heart blockages.   Subjective: Evaluated this AM. Has dry cough which is better than yesterday. No dyspnea on exertion. No chest pain.  ROS: no complaints of nausea, vomiting, constipation, diarrhea,  dysuria. No other complaints.   Assessment & Plan:    Principal Problem: Acute on chronic respiratory failure - COPD on 2 L O2 Hilar mass L pleural effusion - pulmonary consulted  --therapeutic and diagnostic thoracentesis done 7/18 - 800 cc of cloudy fluid removed- - pleural fluid >> 1950 WBC- 75% neutrophils- Light's criteria negative- Cytology: atypical cells- "tissue studies" recommended  - covering for pneumonia  - started Tessalon and Delsym for cough - dyspnea has improved- suspect he had COPD exacerbation- wean steroids and cont nebs - was taken for Bronch today and, per notes, the patient stated he could not breath and refused the procedure - RN then called me stating he was very anxious and requested IV medication as his throat was numb and he could not swallow pills - have started IV Ativan PRN  Active Problems:  Mild troponin elevation - following-  likely cardiac strain from respiratory distress  Cardiomyopathy with EF of 35-40 % and grade 1dCHF 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 - started cardioselective B Blockade with Metoprolol and IV Lasix   - I and O: - 1830 -  daily weights - 90 >> 86 kg - start ACE I later - appreciate cardiac eval - Cath planned for today  Hyperglycemia - in setting of steroids but may have underlying DM - check A1c    Essential hypertension  - hold Norvasc- cont Hydralazine    CKD (chronic kidney disease) stage 3, GFR 30-59 ml/min  - stable  DVT prophylaxis: Heparin Code Status: full code Family Communication: wife Disposition Plan: home when stable Consultants:   PCCM  Cardiology Procedures:   7/16- thoracentesis- left sided  Antimicrobials:  Anti-infectives    Start     Dose/Rate Route Frequency Ordered Stop   11/06/16 1000  azithromycin (ZITHROMAX) tablet 500 mg     500 mg Oral Daily 11/06/16 0912     11/05/16 1830  vancomycin (VANCOCIN) IVPB 750 mg/150 ml premix  Status:  Discontinued     750 mg 150 mL/hr over 60 Minutes Intravenous Every 12 hours 11/05/16 0536 11/05/16 1220   11/05/16 0800  azithromycin (ZITHROMAX) 500 mg in dextrose 5 % 250 mL IVPB  Status:  Discontinued     500 mg 250 mL/hr over 60 Minutes Intravenous Every 24 hours 11/05/16 0522 11/06/16 0912   11/05/16 0600  cefTRIAXone (ROCEPHIN) 1 g in dextrose 5 % 50 mL IVPB     1 g 100 mL/hr over 30 Minutes Intravenous Every 24 hours 11/05/16 0522     11/05/16 0600  vancomycin (VANCOCIN) 1,500 mg in sodium chloride 0.9 % 500 mL IVPB     1,500 mg 250 mL/hr over 120 Minutes Intravenous  Once 11/05/16 0534 11/05/16 0926       Objective: Vitals:   11/07/16 1018 11/07/16 1020 11/07/16 1025 11/07/16 1111  BP: 129/83 129/83 121/87 114/74  Pulse: 77 71 77 76  Resp: (!) 21 18 (!) 9   Temp: 98 F (36.7 C)   98 F (36.7 C)  TempSrc: Oral   Oral  SpO2: 93% 92% 95% 98%  Weight:        Height:        Intake/Output Summary (Last 24 hours) at 11/07/16 1503 Last data filed at 11/07/16 1000  Gross per 24 hour  Intake                0 ml  Output              425 ml  Net             -425 ml   Filed Weights   11/05/16 0535 11/06/16 0451 11/07/16 0740  Weight: 88.8 kg (195 lb 12.8 oz) 90.9 kg (200 lb 8 oz) 86.5 kg (190 lb 9.6 oz)    Examination: General exam: Appears comfortable  HEENT: PERRLA, oral mucosa moist, no sclera icterus or thrush Respiratory system: CTA b/l  No wheezing. Respiratory effort normal. Cardiovascular system: S1 & S2 heard, RRR.  No murmurs  Gastrointestinal system: Abdomen soft, non-tender, nondistended. Normal bowel sound. No organomegaly Central nervous system: Alert and oriented. No focal neurological deficits. Extremities: No cyanosis, clubbing - 1 + pitting edema Skin: No rashes or ulcers Psychiatry:  Mood & affect appropriate.     Data Reviewed: I have personally reviewed following labs and imaging studies  CBC:  Recent Labs Lab 11/04/16 2357 11/06/16 0554 11/07/16 0551  WBC 8.8 12.0* 17.1*  NEUTROABS 6.2  --   --   HGB 14.0 13.5 13.9  HCT 44.8 44.1 45.3  MCV 100.0 101.4* 100.9*  PLT 181 159 841   Basic Metabolic Panel:  Recent Labs Lab 11/04/16 2357 11/05/16 0838 11/06/16 0554 11/07/16 0551  NA 137 140 136 136  K 3.9 4.1 4.3 4.5  CL 103 103 99* 99*  CO2 28 28 30 28   GLUCOSE 178* 126* 399* 230*  BUN 11 12 18  29*  CREATININE 1.42* 1.40* 1.41* 1.41*  CALCIUM 8.6* 8.7* 8.2* 8.8*   GFR: Estimated Creatinine Clearance: 55.8 mL/min (A) (by C-G formula based on SCr of 1.41 mg/dL (H)). Liver Function Tests:  Recent Labs Lab 11/06/16 0554 11/06/16 1703  AST 28  --   ALT 31  --   ALKPHOS 103  --   BILITOT 0.6  --   PROT 6.3* 6.6  ALBUMIN 2.9*  --    No results for input(s): LIPASE, AMYLASE in the last 168 hours. No results for input(s): AMMONIA in the last 168 hours. Coagulation Profile: No results for  input(s): INR, PROTIME in the last 168 hours. Cardiac Enzymes:  Recent Labs Lab 11/05/16 0613 11/05/16 1156 11/05/16 1807  TROPONINI 0.03* 0.04* 0.03*   BNP (last 3 results) No results for input(s): PROBNP in the last 8760 hours. HbA1C:  Recent Labs  11/05/16 0613  HGBA1C 7.1*  CBG:  Recent Labs Lab 11/07/16 0900 11/07/16 1158  GLUCAP 188* 196*   Lipid Profile:  Recent Labs  11/06/16 1703  CHOL 122   Thyroid Function Tests: No results for input(s): TSH, T4TOTAL, FREET4, T3FREE, THYROIDAB in the last 72 hours. Anemia Panel: No results for input(s): VITAMINB12, FOLATE, FERRITIN, TIBC, IRON, RETICCTPCT in the last 72 hours. Urine analysis:    Component Value Date/Time   COLORURINE YELLOW (A) 07/22/2016 0748   APPEARANCEUR CLEAR (A) 07/22/2016 0748   APPEARANCEUR Clear 03/15/2016 0853   LABSPEC 1.009 07/22/2016 0748   PHURINE 6.0 07/22/2016 0748   GLUCOSEU NEGATIVE 07/22/2016 0748   HGBUR MODERATE (A) 07/22/2016 0748   BILIRUBINUR NEGATIVE 07/22/2016 0748   BILIRUBINUR Negative 03/15/2016 0853   KETONESUR NEGATIVE 07/22/2016 0748   PROTEINUR 30 (A) 07/22/2016 0748   NITRITE NEGATIVE 07/22/2016 0748   LEUKOCYTESUR NEGATIVE 07/22/2016 0748   LEUKOCYTESUR Negative 03/15/2016 0853   Sepsis Labs: @LABRCNTIP (procalcitonin:4,lacticidven:4) ) Recent Results (from the past 240 hour(s))  Body fluid culture     Status: None (Preliminary result)   Collection Time: 11/06/16  2:37 PM  Result Value Ref Range Status   Specimen Description FLUID THORACENTESIS  Final   Special Requests NONE  Final   Gram Stain   Final    MODERATE WBC PRESENT, PREDOMINANTLY PMN NO ORGANISMS SEEN    Culture NO GROWTH < 24 HOURS  Final   Report Status PENDING  Incomplete         Radiology Studies: Ct Chest Wo Contrast  Result Date: 11/07/2016 CLINICAL DATA:  66 year old male with progressive dyspnea. Abnormal chest x-ray and CT. Subsequent encounter. EXAM: CT CHEST WITHOUT  CONTRAST TECHNIQUE: Multidetector CT imaging of the chest was performed following the standard protocol without IV contrast. COMPARISON:  11/06/2016 chest x-ray. 11/05/2016 and 06/07/2015 chest CT. FINDINGS: Cardiovascular: Pectus deformity with cardiomegaly. Coronary artery calcifications. Atherosclerotic changes thoracic aorta. Ascending thoracic aorta measures up to 4 Cm. Mediastinum/Nodes: Mediastinal adenopathy. Lungs/Pleura: Obstruction left upper lobe bronchus with postobstructive atelectasis/ infiltrate. Rounded fullness left hilar region raises possibly of obstructing mass (not well delineated on present noncontrast exam). The presence of adjacent mediastinal raises possibility of tumor. Atelectasis with bronchial thickening involving portions of the lower lobes bilaterally and medial aspect of the right middle lobe. Very small left-sided pleural effusion. Upper Abdomen: Slightly lobulated contour of the liver without other findings of cirrhosis. Mild nodularity left adrenal gland. Musculoskeletal: Sclerotic appearance left humeral head suggestive of avascular necrosis. Pectus deformity. Mild degenerative changes thoracic spine with destructive lesion. IMPRESSION: Obstruction left upper lobe bronchus with postobstructive atelectasis/ infiltrate. Rounded fullness left hilar region raises possibly of obstructing mass (not well delineated on present noncontrast exam). The presence of adjacent mediastinal raises possibility of tumor. Atelectasis with bronchial thickening involving portions of the lower lobes bilaterally and medial aspect of the right middle lobe. Very small left-sided pleural effusion. Mild nodularity left adrenal gland. Ascending thoracic aorta measures up to 4 cm. Recommend annual imaging followup by CTA or MRA. This recommendation follows 2010 ACCF/AHA/AATS/ACR/ASA/SCA/SCAI/SIR/STS/SVM Guidelines for the Diagnosis and Management of Patients with Thoracic Aortic Disease. Circulation. 2010;  121: O973-Z329 Sclerotic appearance left humeral head suggestive of avascular necrosis. Pectus deformity with mild cardiomegaly. Aortic Atherosclerosis (ICD10-I70.0). Electronically Signed   By: Genia Del M.D.   On: 11/07/2016 07:18   Dg Chest Port 1 View  Result Date: 11/06/2016 CLINICAL DATA:  Left-sided thoracentesis EXAM: PORTABLE CHEST 1 VIEW COMPARISON:  Yesterday FINDINGS: No visible residual fluid. No visible  pneumothorax. There is a known left hilar mass with extensive upper lobe opacification. Cardiomegaly. Mild atelectasis at the medial right base. IMPRESSION: No acute finding after thoracentesis. Known left upper lobe mass and collapse. Electronically Signed   By: Monte Fantasia M.D.   On: 11/06/2016 14:23      Scheduled Meds: . aspirin EC  81 mg Oral Daily  . atorvastatin  20 mg Oral QODAY  . azithromycin  500 mg Oral Daily  . benzonatate  100 mg Oral TID  . dextromethorphan  15 mg Oral BID  . fluticasone furoate-vilanterol  1 puff Inhalation Daily  . heparin  5,000 Units Subcutaneous Q8H  . insulin aspart  0-5 Units Subcutaneous QHS  . insulin aspart  0-9 Units Subcutaneous TID WC  . methylPREDNISolone (SOLU-MEDROL) injection  80 mg Intravenous Q12H  . metoprolol tartrate  25 mg Oral BID  . pantoprazole  40 mg Oral Daily  . tiotropium  18 mcg Inhalation Daily   Continuous Infusions: . cefTRIAXone (ROCEPHIN)  IV 1 g (11/07/16 0612)     LOS: 2 days    Time spent in minutes: 35    Debbe Odea, MD [facility] Pager: [email or web address] Password [email or web address] 11/07/2016, 3:03 PM

## 2016-11-07 NOTE — Progress Notes (Signed)
Progress Note  Patient Name: Kevin Gutierrez Date of Encounter: 11/07/2016  Primary Cardiologist: New to [facility] - Dr. Sallyanne Kuster  Subjective   No chest pain. Main complaint is cough. Had blood tinged sputum earlier today.   Inpatient Medications    Scheduled Meds: . aspirin EC  81 mg Oral Daily  . atorvastatin  20 mg Oral QODAY  . azithromycin  500 mg Oral Daily  . benzonatate  100 mg Oral TID  . dextromethorphan  15 mg Oral BID  . fluticasone furoate-vilanterol  1 puff Inhalation Daily  . heparin  5,000 Units Subcutaneous Q8H  . insulin aspart  0-5 Units Subcutaneous QHS  . insulin aspart  0-9 Units Subcutaneous TID WC  . methylPREDNISolone (SOLU-MEDROL) injection  80 mg Intravenous Q12H  . metoprolol tartrate  25 mg Oral BID  . pantoprazole  40 mg Oral Daily  . sodium chloride flush  3 mL Intravenous Q12H  . tiotropium  18 mcg Inhalation Daily   Continuous Infusions: . sodium chloride    . sodium chloride 50 mL/hr (11/07/16 0601)  . cefTRIAXone (ROCEPHIN)  IV 1 g (11/07/16 0612)   PRN Meds: sodium chloride, albuterol, clonazePAM, HYDROcodone-acetaminophen, sodium chloride flush   Vital Signs    Vitals:   11/07/16 0733 11/07/16 0740 11/07/16 0850 11/07/16 0903  BP:   110/74   Pulse:   83 81  Resp:      Temp:   97.9 F (36.6 C)   TempSrc:   Oral   SpO2: 90%  92%   Weight:  190 lb 9.6 oz (86.5 kg)    Height:        Intake/Output Summary (Last 24 hours) at 11/07/16 0938 Last data filed at 11/07/16 0612  Gross per 24 hour  Intake                0 ml  Output             1295 ml  Net            -1295 ml   Filed Weights   11/05/16 0535 11/06/16 0451 11/07/16 0740  Weight: 195 lb 12.8 oz (88.8 kg) 200 lb 8 oz (90.9 kg) 190 lb 9.6 oz (86.5 kg)    Telemetry    NSR - Personally Reviewed  ECG    NSR - Personally Reviewed  Physical Exam   GEN: No acute distress.   Neck: No JVD Cardiac: RRR, no murmurs, rubs, or gallops.  Respiratory: decraesed BS at the  bases with slight crackles GI: Soft, nontender, non-distended  MS: No edema; No deformity. Neuro:  Nonfocal  Psych: Normal affect   Labs    Chemistry Recent Labs Lab 11/05/16 0838 11/06/16 0554 11/06/16 1703 11/07/16 0551  NA 140 136  --  136  K 4.1 4.3  --  4.5  CL 103 99*  --  99*  CO2 28 30  --  28  GLUCOSE 126* 399*  --  230*  BUN 12 18  --  29*  CREATININE 1.40* 1.41*  --  1.41*  CALCIUM 8.7* 8.2*  --  8.8*  PROT  --  6.3* 6.6  --   ALBUMIN  --  2.9*  --   --   AST  --  28  --   --   ALT  --  31  --   --   ALKPHOS  --  103  --   --   BILITOT  --  0.6  --   --   GFRNONAA 50* 50*  --  50*  GFRAA 59* 58*  --  58*  ANIONGAP 9 7  --  9     Hematology Recent Labs Lab 11/04/16 2357 11/06/16 0554 11/07/16 0551  WBC 8.8 12.0* 17.1*  RBC 4.48 4.35 4.49  HGB 14.0 13.5 13.9  HCT 44.8 44.1 45.3  MCV 100.0 101.4* 100.9*  MCH 31.3 31.0 31.0  MCHC 31.3 30.6 30.7  RDW 16.8* 16.3* 16.7*  PLT 181 159 205    Cardiac Enzymes Recent Labs Lab 11/05/16 0613 11/05/16 1156 11/05/16 1807  TROPONINI 0.03* 0.04* 0.03*    Recent Labs Lab 11/05/16 0007  TROPIPOC 0.03     BNP Recent Labs Lab 11/04/16 2357  BNP 370.4*     DDimer No results for input(s): DDIMER in the last 168 hours.   Radiology    Ct Chest Wo Contrast  Result Date: 11/07/2016 CLINICAL DATA:  66 year old male with progressive dyspnea. Abnormal chest x-ray and CT. Subsequent encounter. EXAM: CT CHEST WITHOUT CONTRAST TECHNIQUE: Multidetector CT imaging of the chest was performed following the standard protocol without IV contrast. COMPARISON:  11/06/2016 chest x-ray. 11/05/2016 and 06/07/2015 chest CT. FINDINGS: Cardiovascular: Pectus deformity with cardiomegaly. Coronary artery calcifications. Atherosclerotic changes thoracic aorta. Ascending thoracic aorta measures up to 4 Cm. Mediastinum/Nodes: Mediastinal adenopathy. Lungs/Pleura: Obstruction left upper lobe bronchus with postobstructive  atelectasis/ infiltrate. Rounded fullness left hilar region raises possibly of obstructing mass (not well delineated on present noncontrast exam). The presence of adjacent mediastinal raises possibility of tumor. Atelectasis with bronchial thickening involving portions of the lower lobes bilaterally and medial aspect of the right middle lobe. Very small left-sided pleural effusion. Upper Abdomen: Slightly lobulated contour of the liver without other findings of cirrhosis. Mild nodularity left adrenal gland. Musculoskeletal: Sclerotic appearance left humeral head suggestive of avascular necrosis. Pectus deformity. Mild degenerative changes thoracic spine with destructive lesion. IMPRESSION: Obstruction left upper lobe bronchus with postobstructive atelectasis/ infiltrate. Rounded fullness left hilar region raises possibly of obstructing mass (not well delineated on present noncontrast exam). The presence of adjacent mediastinal raises possibility of tumor. Atelectasis with bronchial thickening involving portions of the lower lobes bilaterally and medial aspect of the right middle lobe. Very small left-sided pleural effusion. Mild nodularity left adrenal gland. Ascending thoracic aorta measures up to 4 cm. Recommend annual imaging followup by CTA or MRA. This recommendation follows 2010 ACCF/AHA/AATS/ACR/ASA/SCA/SCAI/SIR/STS/SVM Guidelines for the Diagnosis and Management of Patients with Thoracic Aortic Disease. Circulation. 2010; 121: S568-L275 Sclerotic appearance left humeral head suggestive of avascular necrosis. Pectus deformity with mild cardiomegaly. Aortic Atherosclerosis (ICD10-I70.0). Electronically Signed   By: Genia Del M.D.   On: 11/07/2016 07:18   Dg Chest Port 1 View  Result Date: 11/06/2016 CLINICAL DATA:  Left-sided thoracentesis EXAM: PORTABLE CHEST 1 VIEW COMPARISON:  Yesterday FINDINGS: No visible residual fluid. No visible pneumothorax. There is a known left hilar mass with extensive  upper lobe opacification. Cardiomegaly. Mild atelectasis at the medial right base. IMPRESSION: No acute finding after thoracentesis. Known left upper lobe mass and collapse. Electronically Signed   By: Monte Fantasia M.D.   On: 11/06/2016 14:23    Cardiac Studies  2D Echo 11/02/16 Study Conclusions  - Left ventricle: The cavity size was normal. There was mild   concentric hypertrophy. Systolic function was moderately reduced.   The estimated ejection fraction was in the range of 35% to 40%.   Diffuse hypokinesis. Doppler parameters are consistent with  abnormal left ventricular relaxation (grade 1 diastolic   dysfunction). - Right atrium: The atrium was mildly dilated. - Pulmonary arteries: Systolic pressure could not be accurately   estimated. - Inferior vena cava: The vessel was dilated. Respirophasic changes   in dimension were absent, consistent with elevated central venous   pressure. - Pericardium, extracardiac: Possible left pleural effusion.  Diagnostic LHC - pending    Patient Profile     66 y.o. male smoker with h/o COPD (on home O2), stage III CKD, HTN and HLD, who presented to [facility] on 11/06/14 with worsening dyspnea, found to be in acute on chronic hypoxic respiratory failure with chest CT demonstrating findings concerning for LUL PNA, left pleural effusion and obstruction in the LUL bronchus, concerning for malignancy (bronchoscopy pending). Cardiology consulted for newly reduced EF, down to 35-40%.    Assessment & Plan    1. Cardiomyopathy/ LV Systolic Dysfunction: EF 29-93% by echo. Chest CT with coronary artery calcifications. Given low EF and long h/o tobacco abuse, plan is for LHC this afternoon to exclude underlying CAD as the etiology of his cardiomyopathy. Plan is for diagnostic cath only, given his CAD and also to allow time for a diagnosis of possible lung malignancy/ completion of invasive procedures for lung w/u. Continue BB. No ACE/ARB given CKD. Monitor volume  closely.   2. Lung Mass: Pulmonology following. Bronchoscopy pending.   3. Pleural Effusion: s/p thoracentesis. Per IM and pulmonology.  4. Acute Hypoxic Respiratory Failure: multifactorial. 2/2 to medical problems outlined above.   5. CKD:  SCr 1.41. Plan is for diagnostic cath only for today. Limit contrast exposure. Monitor post cath.   6. COPD: per IM and pulmonology.   Signed, Lyda Jester, PA-C  11/07/2016, 9:38 AM    I have seen and examined the patient along with Lyda Jester, PA-C .  I have reviewed the chart, notes and new data.  I agree with PA/NP's note.  Key new complaints: seen just before bronchoscopy. Lying almost fully flat without distress. Reported hemoptysis. Key examination changes: dullness to percussion and absent breath sounds left base Key new findings / data: stable creat 1.4, baseline (GFR 50)  PLAN: Bronchoscopy this AM. Diagnostic cardiac cath this afternoon. Delay any revascularization until after tissue diagnosis has been confirmed.  Sanda Klein, MD, [place] [phone] 11/07/2016, 10:14 AM

## 2016-11-08 ENCOUNTER — Encounter (HOSPITAL_COMMUNITY): Payer: Self-pay | Admitting: Cardiovascular Disease

## 2016-11-08 ENCOUNTER — Inpatient Hospital Stay (HOSPITAL_COMMUNITY): Payer: PPO | Admitting: Anesthesiology

## 2016-11-08 ENCOUNTER — Encounter (HOSPITAL_COMMUNITY)
Admission: EM | Disposition: A | Discharge status: Home / Self Care | Payer: Self-pay | Source: Home / Self Care | Attending: Internal Medicine

## 2016-11-08 ENCOUNTER — Encounter: Payer: Self-pay | Admitting: Cardiovascular Disease

## 2016-11-08 DIAGNOSIS — J90 Pleural effusion, not elsewhere classified: Secondary | ICD-10-CM

## 2016-11-08 DIAGNOSIS — R918 Other nonspecific abnormal finding of lung field: Secondary | ICD-10-CM

## 2016-11-08 DIAGNOSIS — I2510 Atherosclerotic heart disease of native coronary artery without angina pectoris: Secondary | ICD-10-CM

## 2016-11-08 HISTORY — PX: VIDEO BRONCHOSCOPY: SHX5072

## 2016-11-08 LAB — GLUCOSE, CAPILLARY
GLUCOSE-CAPILLARY: 136 mg/dL — AB (ref 65–99)
Glucose-Capillary: 221 mg/dL — ABNORMAL HIGH (ref 65–99)
Glucose-Capillary: 189 mg/dL — ABNORMAL HIGH (ref 65–99)
Glucose-Capillary: 159 mg/dL — ABNORMAL HIGH (ref 65–99)

## 2016-11-08 LAB — HEMOGLOBIN A1C
Hgb A1c MFr Bld: 7.1 % — ABNORMAL HIGH (ref 4.8–5.6)
MEAN PLASMA GLUCOSE: 157 mg/dL

## 2016-11-08 SURGERY — VIDEO BRONCHOSCOPY WITHOUT FLUORO
Anesthesia: General

## 2016-11-08 MED ORDER — 0.9 % SODIUM CHLORIDE (POUR BTL) OPTIME
TOPICAL | Status: DC | PRN
  Administered 2016-11-08: 1000 mL

## 2016-11-08 MED ORDER — LIDOCAINE HCL (CARDIAC) 20 MG/ML IV SOLN
INTRAVENOUS | Status: DC | PRN
  Administered 2016-11-08: 50 mg via INTRATRACHEAL

## 2016-11-08 MED ORDER — ONDANSETRON HCL 4 MG/2ML IJ SOLN
INTRAMUSCULAR | Status: DC | PRN
  Administered 2016-11-08: 4 mg via INTRAVENOUS

## 2016-11-08 MED ORDER — EPINEPHRINE PF 1 MG/ML IJ SOLN
INTRAMUSCULAR | Status: AC
  Filled 2016-11-08: qty 1

## 2016-11-08 MED ORDER — LACTATED RINGERS IV SOLN
INTRAVENOUS | Status: DC | PRN
  Administered 2016-11-08: 08:00:00 via INTRAVENOUS

## 2016-11-08 MED ORDER — SUCCINYLCHOLINE CHLORIDE 20 MG/ML IJ SOLN
INTRAMUSCULAR | Status: DC | PRN
  Administered 2016-11-08: 100 mg via INTRAVENOUS

## 2016-11-08 MED ORDER — VANCOMYCIN HCL IN DEXTROSE 1-5 GM/200ML-% IV SOLN
1000.0000 mg | INTRAVENOUS | Status: DC
  Filled 2016-11-08: qty 200

## 2016-11-08 MED ORDER — FENTANYL CITRATE (PF) 250 MCG/5ML IJ SOLN
INTRAMUSCULAR | Status: AC
  Filled 2016-11-08: qty 5

## 2016-11-08 MED ORDER — MIDAZOLAM HCL 2 MG/2ML IJ SOLN
INTRAMUSCULAR | Status: AC
  Filled 2016-11-08: qty 2

## 2016-11-08 MED ORDER — FENTANYL CITRATE (PF) 100 MCG/2ML IJ SOLN
INTRAMUSCULAR | Status: DC | PRN
  Administered 2016-11-08: 50 ug via INTRAVENOUS
  Administered 2016-11-08: 25 ug via INTRAVENOUS
  Administered 2016-11-08: 50 ug via INTRAVENOUS
  Administered 2016-11-08: 25 ug via INTRAVENOUS

## 2016-11-08 MED ORDER — PROPOFOL 10 MG/ML IV BOLUS
INTRAVENOUS | Status: AC
  Filled 2016-11-08: qty 20

## 2016-11-08 MED ORDER — PROPOFOL 10 MG/ML IV BOLUS
INTRAVENOUS | Status: DC | PRN
  Administered 2016-11-08: 100 mg via INTRAVENOUS
  Administered 2016-11-08: 30 mg via INTRAVENOUS
  Administered 2016-11-08: 20 mg via INTRAVENOUS

## 2016-11-08 MED ORDER — VANCOMYCIN HCL IN DEXTROSE 750-5 MG/150ML-% IV SOLN
750.0000 mg | Freq: Two times a day (BID) | INTRAVENOUS | Status: DC
  Administered 2016-11-08 – 2016-11-10 (×4): 750 mg via INTRAVENOUS
  Filled 2016-11-08 (×6): qty 150

## 2016-11-08 MED ORDER — EPINEPHRINE PF 1 MG/ML IJ SOLN
INTRAMUSCULAR | Status: DC | PRN
  Administered 2016-11-08: 1 mg via ENDOTRACHEOPULMONARY

## 2016-11-08 MED ORDER — PIPERACILLIN-TAZOBACTAM 3.375 G IVPB
3.3750 g | Freq: Three times a day (TID) | INTRAVENOUS | Status: DC
  Administered 2016-11-08 – 2016-11-10 (×7): 3.375 g via INTRAVENOUS
  Filled 2016-11-08 (×10): qty 50

## 2016-11-08 MED ORDER — FENTANYL CITRATE (PF) 100 MCG/2ML IJ SOLN: 25.0000 ug | INTRAMUSCULAR | Status: DC | PRN

## 2016-11-08 MED ORDER — PHENYLEPHRINE HCL 10 MG/ML IJ SOLN
INTRAMUSCULAR | Status: DC | PRN
  Administered 2016-11-08 (×2): 80 ug via INTRAVENOUS
  Administered 2016-11-08: 40 ug via INTRAVENOUS
  Administered 2016-11-08: 80 ug via INTRAVENOUS
  Administered 2016-11-08: 40 ug via INTRAVENOUS
  Administered 2016-11-08 (×4): 80 ug via INTRAVENOUS

## 2016-11-08 SURGICAL SUPPLY — 23 items
BRUSH CYTOL CELLEBRITY 1.5X140 (MISCELLANEOUS) ×3 IMPLANT
CANISTER SUCT 3000ML PPV (MISCELLANEOUS) ×3 IMPLANT
CONT SPEC 4OZ CLIKSEAL STRL BL (MISCELLANEOUS) ×6 IMPLANT
COVER BACK TABLE 60X90IN (DRAPES) ×3 IMPLANT
FILTER STRAW FLUID ASPIR (MISCELLANEOUS) IMPLANT
FORCEPS BIOP RJ4 1.8 (CUTTING FORCEPS) IMPLANT
FORCEPS RADIAL JAW LRG 4 PULM (INSTRUMENTS) ×1 IMPLANT
GAUZE SPONGE 4X4 12PLY STRL (GAUZE/BANDAGES/DRESSINGS) ×3 IMPLANT
GLOVE SURG SIGNA 7.5 PF LTX (GLOVE) ×3 IMPLANT
KIT CLEAN ENDO COMPLIANCE (KITS) ×3 IMPLANT
KIT ROOM TURNOVER OR (KITS) ×3 IMPLANT
MARKER SKIN DUAL TIP RULER LAB (MISCELLANEOUS) ×3 IMPLANT
NS IRRIG 1000ML POUR BTL (IV SOLUTION) ×3 IMPLANT
OIL SILICONE PENTAX (PARTS (SERVICE/REPAIRS)) ×3 IMPLANT
PAD ARMBOARD 7.5X6 YLW CONV (MISCELLANEOUS) ×6 IMPLANT
RADIAL JAW LRG 4 PULMONARY (INSTRUMENTS) ×2
SYR 20ML ECCENTRIC (SYRINGE) ×3 IMPLANT
SYR 5ML LL (SYRINGE) ×3 IMPLANT
SYR 5ML LUER SLIP (SYRINGE) ×3 IMPLANT
TOWEL OR 17X24 6PK STRL BLUE (TOWEL DISPOSABLE) ×3 IMPLANT
TRAP SPECIMEN MUCOUS 40CC (MISCELLANEOUS) ×6 IMPLANT
TUBE CONNECTING 20'X1/4 (TUBING) ×1
TUBE CONNECTING 20X1/4 (TUBING) ×2 IMPLANT

## 2016-11-08 NOTE — Consult Note (Signed)
           [facility] CM Primary Care Navigator  11/08/2016  JAXZEN VANHORN 08-17-1950 [id number]   Went to see patient earlier at the bedside (5W 24) to identify possible discharge needs but staffreports that patient was transferred to [facility] 08 after procedure.  Will attempt to meet patient at another time at hisnew location.   For questions, please contact:  Dannielle Huh, BSN, RN- [facility] Primary Care Navigator  Telephone: [phone] [place]

## 2016-11-08 NOTE — Op Note (Signed)
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  Estimated Blood Loss: 01UX  Complications: none noted  Indications and History: Kevin Gutierrez is 65 y.o. with history of tobacco use CAD, cardiomyopathy. Admitted with dyspnea and an apparent LUL post-obstructive PNA, L effusion.  Recommendation was to perform video fiberoptic bronchoscopy with biopsies. The risks, benefits, complications, treatment options and expected outcomes were discussed with the patient.  The possibilities of pneumothorax, pneumonia, reaction to medication, pulmonary aspiration, perforation of a viscus, bleeding, failure to diagnose a condition and creating a complication requiring transfusion or operation were discussed with the patient who freely signed the consent.    Description of Procedure: The patient was seen in the Preoperative Area, was examined and was deemed appropriate to proceed.  The patient was taken to OR11, identified as Kevin Gutierrez and the procedure verified as Flexible Video Fiberoptic Bronchoscopy.  A Time Out was held and the above information confirmed.   General anesthesia was initiated and he was intubated. The video fiberoptic bronchoscope was introduced via ETT and a general inspection was performed which showed normal trachea, splayed somewhat irregular main carina. The R sided airways were inspected and showed normal RUL, BI, RML and RLL with the exception of some thick yellow secretions that were suctioned away. The L side was then inspected. There was a fungating white / gray mass eminating from the lingular and LUL airways occluding almost all of the distal LUL bronchus. A brush would pass the mass into the lingula and into the more distal LUL. The scope  itself would not pass.   1:10000 epi was injected onto the lesion. Endobronchial brushings, endobronchial forceps biopsies were performed in multiple areas of the LUL mass. There was some initial moderate bleeding that stopped quickly. Finally endobronchial washings were performed in the LUL to be sent for cytology and bacterial culture. The patient tolerated the procedure well. The bronchoscope was removed. There were no obvious complications. He will transfer back to his hospital room once he is recovered from anesthesia.   Samples: 1. Endobronchial brushings from LUL mass 2. Endobronchial forceps biopsies from LUL mass 3. Bronchial washings from LUL  Plans:  We will review the cytology, pathology and microbiology results with the patient when they become available.     Baltazar Apo, MD, PhD 11/08/2016, 9:34 AM [facility] [phone] or if no answer [phone]

## 2016-11-08 NOTE — Progress Notes (Signed)
Patient arousable but drowsy, follows simple commands. Dr Lamonte Sakai updated on patient's status. Monitored patiet closely,

## 2016-11-08 NOTE — Transfer of Care (Signed)
Immediate Anesthesia Transfer of Care Note  Patient: Kevin Gutierrez  Procedure(s) Performed: Procedure(s): VIDEO BRONCHOSCOPY (N/A)  Patient Location: PACU  Anesthesia Type:General   Level of Consciousness: drowsy   Airway & Oxygen Therapy: Patient Spontanous Breathing, Patient connected to face mask oxygen and non-rebreather face mask  Post-op Assessment: Report given to RN and Post -op Vital signs reviewed and stable  Post vital signs: Reviewed and stable  Last Vitals:  Vitals:   11/08/16 0700 11/08/16 1015  BP: 110/65 (!) 172/78  Pulse: 95 (!) 105  Resp: (!) 21 19  Temp: 36.7 C 36.5 C    Last Pain:  Vitals:   11/08/16 1015  TempSrc:   PainSc: Asleep      Patients Stated Pain Goal: 3 (24/49/75 3005)  Complications: No apparent anesthesia complications

## 2016-11-08 NOTE — Progress Notes (Signed)
PROGRESS NOTE    Kevin Gutierrez   HFW:[id number]  DOB: 07-02-1950  DOA: 11/05/2016 PCP: Coral Spikes, DO   Brief Narrative:  Kevin Gutierrez is a 66 y/o with COPD on 2 L O2 at home, ongoing smoking, found to have EF of 35-40% on ECHO on 7/12, CKD 3, HTN who presented to the ER for progressive dyspnea over the past few months.  In ER, CXR reveals possible L perihilar mass. CT chest reveals abrupt termination of LUL bronch into area of consolidation in LUL and lingula,  Hilar mass, small L pleural effusion with LLL consolidation, mildly preminent mediastinal nodes and mild cardiomegaly. Pulse ox in ER 67% on 2 L    Subjective: Status post bronchoscopy , back to the room, drowsy but easily awakened, he states that he denies any complaints currently, he is denying any shortness of breath nausea vomiting fevers or chills he looks ill and weak .  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   2-ischemic cardiomyopathy ejection fraction of 35% with multi-vessel coronary artery disease :overall  poor status for any surgical intervention cardiology is planning for possible staging PCI they're waiting for the final diagnoses of the mass , we will sit down with the  family and discuss the goals of care.  3-Acute on chronic systolic and diastolic CHF : - new diagnosis - DIURESED well with lasix . -Continue medical management per  cards for the CHF .  4-Hyperglycemia - in setting of steroids .    5- Essential hypertension  - hold Norvasc- cont Hydralazine    6-CKD (chronic kidney disease) stage 3, GFR 30-59 ml/min  - stable  DVT prophylaxis: Heparin Code Status: full code Family Communication: wife Disposition Plan: NOT CLEAR.   Consultants:   PCCM  Cardiology Procedures:   7/16- thoracentesis- left sided  Antimicrobials:  Anti-infectives    Start     Dose/Rate Route Frequency Ordered Stop   11/06/16 1000  azithromycin (ZITHROMAX) tablet 500 mg     500 mg Oral Daily 11/06/16 0912     11/05/16 1830  vancomycin (VANCOCIN) IVPB 750 mg/150 ml premix  Status:  Discontinued     750 mg 150 mL/hr over 60 Minutes Intravenous Every 12 hours 11/05/16 0536 11/05/16 1220   11/05/16 0800  azithromycin (ZITHROMAX) 500 mg in dextrose 5 % 250 mL IVPB  Status:  Discontinued     500 mg 250 mL/hr over 60 Minutes Intravenous Every 24 hours 11/05/16 0522 11/06/16 0912   11/05/16 0600  cefTRIAXone (ROCEPHIN) 1 g in dextrose 5 % 50 mL IVPB     1 g 100 mL/hr over 30 Minutes Intravenous Every 24 hours 11/05/16 0522     11/05/16 0600  vancomycin (VANCOCIN) 1,500 mg in sodium chloride 0.9 % 500 mL IVPB     1,500 mg 250 mL/hr over 120 Minutes Intravenous  Once 11/05/16 0534 11/05/16 0926  Objective: Vitals:   11/08/16 1257 11/08/16 1400 11/08/16 1415 11/08/16 1430  BP: (!) 141/73  (!) 150/69 (!) 153/68  Pulse: 96  95 96  Resp: 20  19 19   Temp: 97.6 F (36.4 C) 98.1 F (36.7 C)    TempSrc:  Oral    SpO2: 92%  95% 94%  Weight:      Height:        Intake/Output Summary (Last 24 hours) at 11/08/16 1500 Last data filed at 11/08/16 1015  Gross per 24 hour  Intake           1302.5 ml  Output              825 ml  Net            477.5 ml   Filed Weights   11/06/16 0451 11/07/16 0740 11/08/16 0611  Weight: 90.9 kg (200 lb 8 oz) 86.5 kg (190 lb 9.6 oz) 86.5 kg (190 lb 11.2 oz)    Examination: General  exam: NAD. HEENT: PERRLA, oral mucosa moist, no sclera icterus or thrush Respiratory system: CTA b/l , DECREASE air entry at the bases. Cardiovascular system: S1 & S2 heard, RRR.  No murmurs  Gastrointestinal system: Abdomen soft, non-tender, nondistended. Normal bowel sound. No organomegaly Central nervous system: Alert and oriented. No focal neurological deficits. Extremities: No cyanosis, clubbing - 1 + pitting edema Skin: No rashes or ulcers Psychiatry:  Mood & affect appropriate.     Data Reviewed: I have personally reviewed following labs and imaging studies  CBC:  Recent Labs Lab 11/04/16 2357 11/06/16 0554 11/07/16 0551  WBC 8.8 12.0* 17.1*  NEUTROABS 6.2  --   --   HGB 14.0 13.5 13.9  HCT 44.8 44.1 45.3  MCV 100.0 101.4* 100.9*  PLT 181 159 638   Basic Metabolic Panel:  Recent Labs Lab 11/04/16 2357 11/05/16 0838 11/06/16 0554 11/07/16 0551  NA 137 140 136 136  K 3.9 4.1 4.3 4.5  CL 103 103 99* 99*  CO2 28 28 30 28   GLUCOSE 178* 126* 399* 230*  BUN 11 12 18  29*  CREATININE 1.42* 1.40* 1.41* 1.41*  CALCIUM 8.6* 8.7* 8.2* 8.8*   GFR: Estimated Creatinine Clearance: 55.8 mL/min (A) (by C-G formula based on SCr of 1.41 mg/dL (H)). Liver Function Tests:  Recent Labs Lab 11/06/16 0554 11/06/16 1703  AST 28  --   ALT 31  --   ALKPHOS 103  --   BILITOT 0.6  --   PROT 6.3* 6.6  ALBUMIN 2.9*  --    No results for input(s): LIPASE, AMYLASE in the last 168 hours. No results for input(s): AMMONIA in the last 168 hours. Coagulation Profile: No results for input(s): INR, PROTIME in the last 168 hours. Cardiac Enzymes:  Recent Labs Lab 11/05/16 0613 11/05/16 1156 11/05/16 1807  TROPONINI 0.03* 0.04* 0.03*   BNP (last 3 results) No results for input(s): PROBNP in the last 8760 hours. HbA1C:  Recent Labs  11/07/16 0955  HGBA1C 7.1*   CBG:  Recent Labs Lab 11/07/16 0900 11/07/16 1158 11/07/16 1913 11/08/16 0611 11/08/16 1340  GLUCAP  188* 196* 150* 221* 189*   Lipid Profile:  Recent Labs  11/06/16 1703  CHOL 122   Thyroid Function Tests: No results for input(s): TSH, T4TOTAL, FREET4, T3FREE, THYROIDAB in the last 72 hours. Anemia Panel: No results for input(s): VITAMINB12, FOLATE, FERRITIN, TIBC, IRON, RETICCTPCT in the last 72 hours. Urine analysis:    Component Value  Date/Time   COLORURINE YELLOW (A) 07/22/2016 0748   APPEARANCEUR CLEAR (A) 07/22/2016 0748   APPEARANCEUR Clear 03/15/2016 0853   LABSPEC 1.009 07/22/2016 0748   PHURINE 6.0 07/22/2016 0748   GLUCOSEU NEGATIVE 07/22/2016 0748   HGBUR MODERATE (A) 07/22/2016 0748   BILIRUBINUR NEGATIVE 07/22/2016 0748   BILIRUBINUR Negative 03/15/2016 0853   KETONESUR NEGATIVE 07/22/2016 0748   PROTEINUR 30 (A) 07/22/2016 0748   NITRITE NEGATIVE 07/22/2016 0748   LEUKOCYTESUR NEGATIVE 07/22/2016 0748   LEUKOCYTESUR Negative 03/15/2016 0853   Sepsis Labs: @LABRCNTIP (procalcitonin:4,lacticidven:4) ) Recent Results (from the past 240 hour(s))  Body fluid culture     Status: None (Preliminary result)   Collection Time: 11/06/16  2:37 PM  Result Value Ref Range Status   Specimen Description FLUID THORACENTESIS  Final   Special Requests NONE  Final   Gram Stain   Final    MODERATE WBC PRESENT, PREDOMINANTLY PMN NO ORGANISMS SEEN    Culture NO GROWTH 2 DAYS  Final   Report Status PENDING  Incomplete         Radiology Studies: Ct Chest Wo Contrast  Result Date: 11/07/2016 CLINICAL DATA:  66 year old male with progressive dyspnea. Abnormal chest x-ray and CT. Subsequent encounter. EXAM: CT CHEST WITHOUT CONTRAST TECHNIQUE: Multidetector CT imaging of the chest was performed following the standard protocol without IV contrast. COMPARISON:  11/06/2016 chest x-ray. 11/05/2016 and 06/07/2015 chest CT. FINDINGS: Cardiovascular: Pectus deformity with cardiomegaly. Coronary artery calcifications. Atherosclerotic changes thoracic aorta. Ascending thoracic aorta  measures up to 4 Cm. Mediastinum/Nodes: Mediastinal adenopathy. Lungs/Pleura: Obstruction left upper lobe bronchus with postobstructive atelectasis/ infiltrate. Rounded fullness left hilar region raises possibly of obstructing mass (not well delineated on present noncontrast exam). The presence of adjacent mediastinal raises possibility of tumor. Atelectasis with bronchial thickening involving portions of the lower lobes bilaterally and medial aspect of the right middle lobe. Very small left-sided pleural effusion. Upper Abdomen: Slightly lobulated contour of the liver without other findings of cirrhosis. Mild nodularity left adrenal gland. Musculoskeletal: Sclerotic appearance left humeral head suggestive of avascular necrosis. Pectus deformity. Mild degenerative changes thoracic spine with destructive lesion. IMPRESSION: Obstruction left upper lobe bronchus with postobstructive atelectasis/ infiltrate. Rounded fullness left hilar region raises possibly of obstructing mass (not well delineated on present noncontrast exam). The presence of adjacent mediastinal raises possibility of tumor. Atelectasis with bronchial thickening involving portions of the lower lobes bilaterally and medial aspect of the right middle lobe. Very small left-sided pleural effusion. Mild nodularity left adrenal gland. Ascending thoracic aorta measures up to 4 cm. Recommend annual imaging followup by CTA or MRA. This recommendation follows 2010 ACCF/AHA/AATS/ACR/ASA/SCA/SCAI/SIR/STS/SVM Guidelines for the Diagnosis and Management of Patients with Thoracic Aortic Disease. Circulation. 2010; 121: J093-O671 Sclerotic appearance left humeral head suggestive of avascular necrosis. Pectus deformity with mild cardiomegaly. Aortic Atherosclerosis (ICD10-I70.0). Electronically Signed   By: Genia Del M.D.   On: 11/07/2016 07:18      Scheduled Meds: . aspirin EC  81 mg Oral Daily  . atorvastatin  20 mg Oral QODAY  . azithromycin  500 mg Oral  Daily  . benzonatate  100 mg Oral TID  . dextromethorphan  15 mg Oral BID  . fluticasone furoate-vilanterol  1 puff Inhalation Daily  . heparin  5,000 Units Subcutaneous Q8H  . insulin aspart  0-5 Units Subcutaneous QHS  . insulin aspart  0-9 Units Subcutaneous TID WC  . isosorbide mononitrate  30 mg Oral Daily  . methylPREDNISolone (SOLU-MEDROL) injection  40 mg Intravenous  Q12H  . metoprolol tartrate  25 mg Oral BID  . pantoprazole  40 mg Oral Daily  . sodium chloride flush  3 mL Intravenous Q12H  . tiotropium  18 mcg Inhalation Daily   Continuous Infusions: . sodium chloride    . cefTRIAXone (ROCEPHIN)  IV 1 g (11/08/16 4314)     LOS: 3 days    Time spent in minutes: [place], MD [facility] Pager: [email or web address] Password [email or web address] 11/08/2016, 3:00 PM

## 2016-11-08 NOTE — Anesthesia Postprocedure Evaluation (Signed)
Anesthesia Post Note  Patient: Kevin Gutierrez  Procedure(s) Performed: Procedure(s) (LRB): VIDEO BRONCHOSCOPY (N/A)     Patient location during evaluation: PACU Anesthesia Type: General Level of consciousness: awake and alert Pain management: pain level controlled Vital Signs Assessment: post-procedure vital signs reviewed and stable Respiratory status: spontaneous breathing, nonlabored ventilation, respiratory function stable and patient connected to nasal cannula oxygen Cardiovascular status: blood pressure returned to baseline and stable Postop Assessment: no signs of nausea or vomiting Anesthetic complications: no    Last Vitals:  Vitals:   11/08/16 1230 11/08/16 1245  BP: (!) 147/68 137/69  Pulse: 97 96  Resp: 17 17  Temp:      Last Pain:  Vitals:   11/08/16 1045  TempSrc:   PainSc: 0-No pain                 Kevin Gutierrez,W. EDMOND

## 2016-11-08 NOTE — Progress Notes (Signed)
TR BAND REMOVAL  LOCATION:    R radial  DEFLATED PER PROTOCOL:    Yes  TIME BAND OFF / DRESSING APPLIED:    21:45  SITE UPON ARRIVAL:    Level  0  SITE AFTER BAND REMOVAL:    Level 0  CIRCULATION SENSATION AND MOVEMENT:    Within Normal Limits   Yes  COMMENTS:   Pt tolerated procedure well.

## 2016-11-08 NOTE — Interval H&P Note (Signed)
PCCM Interval Note  Kevin Gutierrez underwent L and R heart cath yesterday pm. Showed multivessel disease with a 95% mid-RCA lesion. No PTCI performed. This may be revisited depending on the results of his FOB and depending on his renal fxn / recovery.  He denies any CP, dyspnea at this time. He is nervous about the bronchoscopy. I explained the procedure, that there is some risk associated with general anesthesia, particularly difficulty extubating given his hx tobacco and COPD. He understands.   Vitals:   11/08/16 0100 11/08/16 0200 11/08/16 0611 11/08/16 0700  BP: 95/63 96/63  110/65  Pulse: 93 96  95  Resp: 20 (!) 26  (!) 21  Temp:    98.1 F (36.7 C)  TempSrc:    Oral  SpO2: 92% 91%  90%  Weight:   86.5 kg (190 lb 11.2 oz)   Height:         Gen: Pleasant, well-nourished, in no distress, slightly anxious  ENT: No lesions,  mouth clear,  oropharynx clear, M2 airway  Neck: No JVD, no stridor  Lungs: No use of accessory muscles, no crackles, no wheeze, distant  Cardiovascular: RRR, heart sounds normal, no murmur or gallops, no peripheral edema  Musculoskeletal: No deformities, no cyanosis or clubbing  Neuro: alert, non focal  Skin: Warm, no lesions or rashes     Recent Labs Lab 11/04/16 2357 11/05/16 0838 11/06/16 0554 11/07/16 0551  NA 137 140 136 136  K 3.9 4.1 4.3 4.5  CL 103 103 99* 99*  CO2 28 28 30 28   GLUCOSE 178* 126* 399* 230*  BUN 11 12 18  29*  CREATININE 1.42* 1.40* 1.41* 1.41*  CALCIUM 8.6* 8.7* 8.2* 8.8*    Recent Labs Lab 11/04/16 2357 11/06/16 0554 11/07/16 0551  HGB 14.0 13.5 13.9  HCT 44.8 44.1 45.3  WBC 8.8 12.0* 17.1*  PLT 181 159 205   INR from this am >> pending  Plan:  Standard FOB to eval LUL obstruction, to be done under general anesthesia for additional monitoring. Pt understands and agrees  Baltazar Apo, MD, PhD 11/08/2016, 8:16 AM [facility] [phone] or if no answer [phone]

## 2016-11-08 NOTE — Care Management Note (Addendum)
Case Management Note  Patient Details  Name: Kevin Gutierrez MRN: [id number] Date of Birth: 07/09/1950  Subjective/Objective:    From home with wife,  S/p L and R heart cath, showed multivessel dz, no PTCI performed.  He is for standard FOB to eval LUL obstruction to be done under general anesthesia for additional monitoring.  He has home oxygen, he is on 2 liters at home, he has a walker, a transfer bench for his bathtub.  Wife could not remember company who oxygen is with but she will bring his tank for him whenever he is medically ready for dc. She mentioned she was not sure but they may need a hospital bed, she states she will let NCM know.    The patient is undergoing current evaluation of a lung mass .  He will require biopsying.  Angiograms were reviewed ,  Percutaneous coronary Intervention or CABG revascularization surgery will be considered following pulmonary workup per cath report.     7/20 [place], BSN - NCM reeived called from Melody with Palliative stating they have a consult from MD to see patient and that they would need Hospice.  NCM saw MD on floor speaking with patient and wife, wife is still in denial and she does not want hospice, so NCM did not see patient for hospice referral, notified palliative and Md informed palliative and the palliative consult was canceled.         Action/Plan: NCM will follow for dc needs.   Expected Discharge Date:                  Expected Discharge Plan:     In-House Referral:     Discharge planning Services  CM Consult  Post Acute Care Choice:    Choice offered to:     DME Arranged:    DME Agency:     HH Arranged:    HH Agency:     Status of Service:  In process, will continue to follow  If discussed at Long Length of Stay Meetings, dates discussed:    Additional Comments:  Zenon Mayo, RN 11/08/2016, 4:01 PM

## 2016-11-08 NOTE — Progress Notes (Signed)
Progress Note  Patient Name: Kevin Gutierrez Date of Encounter: 11/08/2016  Primary Cardiologist: New  Subjective   Seen immediately after bronchoscopy. He is still groggy, just cam off BiPAP. A fungating mass was subtotally obstructing the common root of the LUL and lingular bronchus. Cath yesterday shows multivessel CAD, but EF looked better than it did on echo at 45-50%. LVEDP 14-28 mm Hg. Atypical cells were reported in the fluid from his left pleurocentesis. Creatinine unchanged after cath and net 1.5L diuresis/24h. Weight down at least 5 lb (possibly 10??) since admission.  Inpatient Medications    Scheduled Meds: . [MAR Hold] aspirin EC  81 mg Oral Daily  . [MAR Hold] atorvastatin  20 mg Oral QODAY  . [MAR Hold] azithromycin  500 mg Oral Daily  . [MAR Hold] benzonatate  100 mg Oral TID  . [MAR Hold] dextromethorphan  15 mg Oral BID  . [MAR Hold] fluticasone furoate-vilanterol  1 puff Inhalation Daily  . [MAR Hold] heparin  5,000 Units Subcutaneous Q8H  . [MAR Hold] insulin aspart  0-5 Units Subcutaneous QHS  . [MAR Hold] insulin aspart  0-9 Units Subcutaneous TID WC  . [MAR Hold] isosorbide mononitrate  30 mg Oral Daily  . [MAR Hold] methylPREDNISolone (SOLU-MEDROL) injection  40 mg Intravenous Q12H  . [MAR Hold] metoprolol tartrate  25 mg Oral BID  . [MAR Hold] pantoprazole  40 mg Oral Daily  . [MAR Hold] sodium chloride flush  3 mL Intravenous Q12H  . [MAR Hold] tiotropium  18 mcg Inhalation Daily   Continuous Infusions: . [MAR Hold] sodium chloride    . [MAR Hold] cefTRIAXone (ROCEPHIN)  IV 1 g (11/08/16 2836)   PRN Meds: [OQH Hold] sodium chloride, [MAR Hold] acetaminophen, [MAR Hold] albuterol, [MAR Hold] diazepam, fentaNYL (SUBLIMAZE) injection, [MAR Hold] HYDROcodone-acetaminophen, [MAR Hold] LORazepam, [MAR Hold] ondansetron (ZOFRAN) IV, [MAR Hold] sodium chloride flush   Vital Signs    Vitals:   11/08/16 1130 11/08/16 1145 11/08/16 1200 11/08/16 1215    BP: (!) 157/75 (!) 159/81 (!) 161/77 (!) 148/71  Pulse: 92 95 96 95  Resp: 17 18 18 15   Temp:      TempSrc:      SpO2: 97% 97% 94% 94%  Weight:      Height:        Intake/Output Summary (Last 24 hours) at 11/08/16 1227 Last data filed at 11/08/16 1015  Gross per 24 hour  Intake           1302.5 ml  Output             1025 ml  Net            277.5 ml   Filed Weights   11/06/16 0451 11/07/16 0740 11/08/16 0611  Weight: 200 lb 8 oz (90.9 kg) 190 lb 9.6 oz (86.5 kg) 190 lb 11.2 oz (86.5 kg)    Telemetry    NSR - Personally Reviewed  ECG    No new tracing. - Personally Reviewed  Physical Exam  sleepy GEN: No acute distress.   Neck: No JVD Cardiac: RRR, no murmurs, rubs, or gallops.  Respiratory: reduced BS left base. Clear to auscultation bilaterally. GI: Soft, nontender, non-distended  MS: No edema; No deformity. Neuro:  Nonfocal  Psych: Normal affect   Labs    Chemistry Recent Labs Lab 11/05/16 0838 11/06/16 0554 11/06/16 1703 11/07/16 0551  NA 140 136  --  136  K 4.1 4.3  --  4.5  CL 103 99*  --  99*  CO2 28 30  --  28  GLUCOSE 126* 399*  --  230*  BUN 12 18  --  29*  CREATININE 1.40* 1.41*  --  1.41*  CALCIUM 8.7* 8.2*  --  8.8*  PROT  --  6.3* 6.6  --   ALBUMIN  --  2.9*  --   --   AST  --  28  --   --   ALT  --  31  --   --   ALKPHOS  --  103  --   --   BILITOT  --  0.6  --   --   GFRNONAA 50* 50*  --  50*  GFRAA 59* 58*  --  58*  ANIONGAP 9 7  --  9     Hematology Recent Labs Lab 11/04/16 2357 11/06/16 0554 11/07/16 0551  WBC 8.8 12.0* 17.1*  RBC 4.48 4.35 4.49  HGB 14.0 13.5 13.9  HCT 44.8 44.1 45.3  MCV 100.0 101.4* 100.9*  MCH 31.3 31.0 31.0  MCHC 31.3 30.6 30.7  RDW 16.8* 16.3* 16.7*  PLT 181 159 205    Cardiac Enzymes Recent Labs Lab 11/05/16 0613 11/05/16 1156 11/05/16 1807  TROPONINI 0.03* 0.04* 0.03*    Recent Labs Lab 11/05/16 0007  TROPIPOC 0.03     BNP Recent Labs Lab 11/04/16 2357  BNP 370.4*      DDimer No results for input(s): DDIMER in the last 168 hours.   Radiology    Ct Chest Wo Contrast  Result Date: 11/07/2016 CLINICAL DATA:  66 year old male with progressive dyspnea. Abnormal chest x-ray and CT. Subsequent encounter. EXAM: CT CHEST WITHOUT CONTRAST TECHNIQUE: Multidetector CT imaging of the chest was performed following the standard protocol without IV contrast. COMPARISON:  11/06/2016 chest x-ray. 11/05/2016 and 06/07/2015 chest CT. FINDINGS: Cardiovascular: Pectus deformity with cardiomegaly. Coronary artery calcifications. Atherosclerotic changes thoracic aorta. Ascending thoracic aorta measures up to 4 Cm. Mediastinum/Nodes: Mediastinal adenopathy. Lungs/Pleura: Obstruction left upper lobe bronchus with postobstructive atelectasis/ infiltrate. Rounded fullness left hilar region raises possibly of obstructing mass (not well delineated on present noncontrast exam). The presence of adjacent mediastinal raises possibility of tumor. Atelectasis with bronchial thickening involving portions of the lower lobes bilaterally and medial aspect of the right middle lobe. Very small left-sided pleural effusion. Upper Abdomen: Slightly lobulated contour of the liver without other findings of cirrhosis. Mild nodularity left adrenal gland. Musculoskeletal: Sclerotic appearance left humeral head suggestive of avascular necrosis. Pectus deformity. Mild degenerative changes thoracic spine with destructive lesion. IMPRESSION: Obstruction left upper lobe bronchus with postobstructive atelectasis/ infiltrate. Rounded fullness left hilar region raises possibly of obstructing mass (not well delineated on present noncontrast exam). The presence of adjacent mediastinal raises possibility of tumor. Atelectasis with bronchial thickening involving portions of the lower lobes bilaterally and medial aspect of the right middle lobe. Very small left-sided pleural effusion. Mild nodularity left adrenal gland. Ascending  thoracic aorta measures up to 4 cm. Recommend annual imaging followup by CTA or MRA. This recommendation follows 2010 ACCF/AHA/AATS/ACR/ASA/SCA/SCAI/SIR/STS/SVM Guidelines for the Diagnosis and Management of Patients with Thoracic Aortic Disease. Circulation. 2010; 121: H474-Q595 Sclerotic appearance left humeral head suggestive of avascular necrosis. Pectus deformity with mild cardiomegaly. Aortic Atherosclerosis (ICD10-I70.0). Electronically Signed   By: Genia Del M.D.   On: 11/07/2016 07:18   Dg Chest Port 1 View  Result Date: 11/06/2016 CLINICAL DATA:  Left-sided thoracentesis EXAM: PORTABLE CHEST 1 VIEW COMPARISON:  Yesterday FINDINGS:  No visible residual fluid. No visible pneumothorax. There is a known left hilar mass with extensive upper lobe opacification. Cardiomegaly. Mild atelectasis at the medial right base. IMPRESSION: No acute finding after thoracentesis. Known left upper lobe mass and collapse. Electronically Signed   By: Monte Fantasia M.D.   On: 11/06/2016 14:23    Cardiac Studies   CATH 11/07/16  Mid RCA-2 lesion, 30 %stenosed.  Mid RCA-1 lesion, 95 %stenosed.  Dist RCA lesion, 55 %stenosed.  Mid LAD lesion, 40 %stenosed.  Mid LAD to Dist LAD lesion, 60 %stenosed.  Dist LAD lesion, 70 %stenosed.  Prox Cx to Mid Cx lesion, 70 %stenosed.  Dist Cx lesion, 70 %stenosed.  2nd Mrg lesion, 70 %stenosed.  The left ventricular ejection fraction is 45-50% by visual estimate.  There is mild left ventricular systolic dysfunction.  LV end diastolic pressure is moderately elevated.   Mild LV dysfunction with inferior hypo-kinesis in a global ejection fraction at 45-50%.  Significant multivessel CAD with [proximal] 40% LAD stenosis with 60-70% mid LAD stenosis; segmental 70% stenosis in the proximal circumflex extending into the on 2 vessel; and high-grade 95% focal mid RCA stenosis with 30% followed by 50-60% stenosis prior to the PDA takeoff. Diagnostic Diagram          Patient Profile     66 y.o. 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  Assessment & Plan    1. CHF: fair hemodynamic compensation clinically and moderately elevated LVEDP. Will place on daily oral diuretic. Start ARB if creat stable tomorrow. 2. 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 Will discuss option for PCI (proximal RCA and proximal LAD?) with interventional cardiology, but will wait for tissue diagnosis, treatment plan and prognosis estimation by Pulmonology and Oncology. Aspirin for now, no clopidogrel until we are sure that we have adequate sample for tissue diagnosis. 3. 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 4. 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 Stable. Risk of contrast nephrotoxicity mildly elevated only, so far stable renal function.  Signed, Sanda Klein, MD  11/08/2016, 12:27 PM

## 2016-11-08 NOTE — Anesthesia Procedure Notes (Signed)
Procedure Name: Intubation Date/Time: 11/08/2016 8:43 AM Performed by: Shirlyn Goltz Pre-anesthesia Checklist: Patient identified, Emergency Drugs available, Suction available and Patient being monitored Patient Re-evaluated:Patient Re-evaluated prior to induction Oxygen Delivery Method: Circle system utilized Preoxygenation: Pre-oxygenation with 100% oxygen Induction Type: IV induction Ventilation: Mask ventilation without difficulty and Oral airway inserted - appropriate to patient size Laryngoscope Size: Mac and 4 Grade View: Grade I Tube type: Oral Tube size: 8.5 mm Number of attempts: 1 Airway Equipment and Method: Stylet and LTA kit utilized Placement Confirmation: ETT inserted through vocal cords under direct vision,  positive ETCO2 and breath sounds checked- equal and bilateral Secured at: 22 cm Tube secured with: Tape Dental Injury: Teeth and Oropharynx as per pre-operative assessment

## 2016-11-08 NOTE — H&P (View-Only) (Signed)
PCCM Interval Note  Mr Kevin Gutierrez did not go for his planned FOB today. He had relative hypoxemia on 4L/min, also began to become anxious and dyspneic when his throat was anesthetized w lidocaine. We cancelled the procedure. 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 His pleural fluid cytology is pending.   We will arrange for FOB under general anesthesia on 7/18 so that he can be monitored more closely. If his pleural fluid gives a dx or if there is a contraindication for anesthesia based on his L cath this pm then we will cancel the case.   Baltazar Apo, MD, PhD 11/07/2016, 3:16 PM [facility] [phone] or if no answer [phone]

## 2016-11-08 NOTE — Progress Notes (Signed)
PHARMACY NOTE:  ANTIMICROBIAL RENAL DOSAGE ADJUSTMENT  Current antimicrobial regimen includes a mismatch between antimicrobial dosage and estimated renal function.  As per policy approved by the Pharmacy & Therapeutics and Medical Executive Committees, the antimicrobial dosage will be adjusted accordingly.  Current antimicrobial dosage: Vancomycin 1g IV q24 hours  Indication: HCAP  Renal Function:  Estimated Creatinine Clearance: 55.8 mL/min (A) (by C-G formula based on SCr of 1.41 mg/dL (H)).   []      On intermittent HD, scheduled: []      On CRRT    Antimicrobial dosage has been changed to:  Vancomycin 750mg  IV q12 hours   Additional comments: Pharmacy will continue to follow along, for any needed dose adjustments. Would recommend checking level prior to the weekend if continued. Would also recommend follow Scr closely as patient is post cath.  Thank you for allowing pharmacy to be a part of this patient's care.  Erin Hearing PharmD., BCPS Clinical Pharmacist Pager [phone] 11/08/2016 4:11 PM

## 2016-11-09 ENCOUNTER — Encounter (HOSPITAL_COMMUNITY): Payer: Self-pay | Admitting: Emergency Medicine

## 2016-11-09 DIAGNOSIS — J9602 Acute respiratory failure with hypercapnia: Secondary | ICD-10-CM

## 2016-11-09 DIAGNOSIS — I5023 Acute on chronic systolic (congestive) heart failure: Secondary | ICD-10-CM

## 2016-11-09 DIAGNOSIS — J9612 Chronic respiratory failure with hypercapnia: Secondary | ICD-10-CM

## 2016-11-09 DIAGNOSIS — R042 Hemoptysis: Secondary | ICD-10-CM

## 2016-11-09 DIAGNOSIS — C3490 Malignant neoplasm of unspecified part of unspecified bronchus or lung: Secondary | ICD-10-CM

## 2016-11-09 LAB — GLUCOSE, CAPILLARY
GLUCOSE-CAPILLARY: 179 mg/dL — AB (ref 65–99)
Glucose-Capillary: 179 mg/dL — ABNORMAL HIGH (ref 65–99)
Glucose-Capillary: 182 mg/dL — ABNORMAL HIGH (ref 65–99)

## 2016-11-09 LAB — CHOLESTEROL, BODY FLUID: CHOL FL: 24 mg/dL

## 2016-11-09 LAB — CBC
HEMATOCRIT: 46.7 % (ref 39.0–52.0)
HEMOGLOBIN: 14 g/dL (ref 13.0–17.0)
MCH: 31.5 pg (ref 26.0–34.0)
MCHC: 30 g/dL (ref 30.0–36.0)
MCV: 105.2 fL — AB (ref 78.0–100.0)
Platelets: 155 10*3/uL (ref 150–400)
RBC: 4.44 MIL/uL (ref 4.22–5.81)
RDW: 16.5 % — ABNORMAL HIGH (ref 11.5–15.5)
WBC: 12 10*3/uL — ABNORMAL HIGH (ref 4.0–10.5)

## 2016-11-09 LAB — BASIC METABOLIC PANEL
Anion gap: 9 (ref 5–15)
BUN: 40 mg/dL — AB (ref 6–20)
CHLORIDE: 99 mmol/L — AB (ref 101–111)
CO2: 31 mmol/L (ref 22–32)
Calcium: 8.4 mg/dL — ABNORMAL LOW (ref 8.9–10.3)
Creatinine, Ser: 1.45 mg/dL — ABNORMAL HIGH (ref 0.61–1.24)
GFR calc Af Amer: 56 mL/min — ABNORMAL LOW (ref >60)
GFR calc non Af Amer: 48 mL/min — ABNORMAL LOW (ref >60)
GLUCOSE: 187 mg/dL — AB (ref 65–99)
POTASSIUM: 5.4 mmol/L — AB (ref 3.5–5.1)
Sodium: 139 mmol/L (ref 135–145)

## 2016-11-09 LAB — COMPREHENSIVE METABOLIC PANEL
ALBUMIN: 2.7 g/dL — AB (ref 3.5–5.0)
ALK PHOS: 108 U/L (ref 38–126)
ALT: 39 U/L (ref 17–63)
ANION GAP: 5 (ref 5–15)
AST: 35 U/L (ref 15–41)
BILIRUBIN TOTAL: 1.2 mg/dL (ref 0.3–1.2)
BUN: 38 mg/dL — ABNORMAL HIGH (ref 6–20)
CALCIUM: 8.3 mg/dL — AB (ref 8.9–10.3)
CO2: 34 mmol/L — ABNORMAL HIGH (ref 22–32)
Chloride: 100 mmol/L — ABNORMAL LOW (ref 101–111)
Creatinine, Ser: 1.51 mg/dL — ABNORMAL HIGH (ref 0.61–1.24)
GFR calc Af Amer: 53 mL/min — ABNORMAL LOW (ref >60)
GFR calc non Af Amer: 46 mL/min — ABNORMAL LOW (ref >60)
GLUCOSE: 192 mg/dL — AB (ref 65–99)
Potassium: 5.6 mmol/L — ABNORMAL HIGH (ref 3.5–5.1)
Sodium: 139 mmol/L (ref 135–145)
TOTAL PROTEIN: 5.6 g/dL — AB (ref 6.5–8.1)

## 2016-11-09 LAB — PH, BODY FLUID: PH, BODY FLUID: 7.8

## 2016-11-09 LAB — RHEUMATOID FACTORS, FLUID: RHEUMATOID ARTHRITIS, QN/FLUID: NEGATIVE

## 2016-11-09 MED ORDER — FUROSEMIDE 40 MG PO TABS
40.0000 mg | ORAL_TABLET | Freq: Every day | ORAL | Status: DC
  Administered 2016-11-09 – 2016-11-11 (×3): 40 mg via ORAL
  Filled 2016-11-09 (×3): qty 1

## 2016-11-09 MED ORDER — SODIUM POLYSTYRENE SULFONATE 15 GM/60ML PO SUSP
15.0000 g | Freq: Once | ORAL | Status: AC
  Administered 2016-11-09: 15 g via ORAL
  Filled 2016-11-09: qty 60

## 2016-11-09 MED ORDER — IRBESARTAN 75 MG PO TABS
150.0000 mg | ORAL_TABLET | Freq: Every day | ORAL | Status: DC
  Filled 2016-11-09: qty 2

## 2016-11-09 MED ORDER — ISOSORB DINITRATE-HYDRALAZINE 20-37.5 MG PO TABS
1.0000 | ORAL_TABLET | Freq: Three times a day (TID) | ORAL | Status: DC
  Administered 2016-11-09 – 2016-11-11 (×6): 1 via ORAL
  Filled 2016-11-09 (×6): qty 1

## 2016-11-09 NOTE — Progress Notes (Signed)
Persistent hyperkalemia despite no supplement of K. Stop ARB. Bidil for CHF.  Sanda Klein, MD, [facility] [phone] office [phone] pager

## 2016-11-09 NOTE — Progress Notes (Signed)
RT attempted ABG x 1. Flash of blood was all obtained. Pt and family do not want an additional ABG attempt.  Pt very fearful of needles.

## 2016-11-09 NOTE — Progress Notes (Signed)
Progress Note  Patient Name: Kevin Gutierrez Date of Encounter: 11/09/2016  Primary Cardiologist: New  Subjective   He has been very groggy, lethargic since his general anesthesia/bronchoscopy. Has been on as much as 5L O2, now 3 L. At home on 2L O2 with typical O2 sat 88%. Coughing, no hemoptysis. -3L since admission. Creat essentially unchanged at 1.5. K is 5.6, he is not on KCl supplement. Suspect today's weight is inaccurate.  Inpatient Medications    Scheduled Meds: . aspirin EC  81 mg Oral Daily  . atorvastatin  20 mg Oral QODAY  . benzonatate  100 mg Oral TID  . dextromethorphan  15 mg Oral BID  . fluticasone furoate-vilanterol  1 puff Inhalation Daily  . heparin  5,000 Units Subcutaneous Q8H  . insulin aspart  0-5 Units Subcutaneous QHS  . insulin aspart  0-9 Units Subcutaneous TID WC  . isosorbide mononitrate  30 mg Oral Daily  . methylPREDNISolone (SOLU-MEDROL) injection  40 mg Intravenous Q12H  . metoprolol tartrate  25 mg Oral BID  . pantoprazole  40 mg Oral Daily  . sodium chloride flush  3 mL Intravenous Q12H  . tiotropium  18 mcg Inhalation Daily   Continuous Infusions: . sodium chloride    . piperacillin-tazobactam (ZOSYN)  IV 3.375 g (11/09/16 0800)  . vancomycin 750 mg (11/09/16 0544)   PRN Meds: sodium chloride, acetaminophen, albuterol, diazepam, HYDROcodone-acetaminophen, LORazepam, ondansetron (ZOFRAN) IV, sodium chloride flush   Vital Signs    Vitals:   11/09/16 0500 11/09/16 0600 11/09/16 0700 11/09/16 0926  BP: (!) 163/67 (!) 156/58 (!) 174/70   Pulse: 74 77 77   Resp: 18 17 17    Temp: (!) 97.4 F (36.3 C)  (!) 96.8 F (36 C)   TempSrc: Oral  Axillary   SpO2: 90% 90% 93% 95%  Weight: 200 lb 9.9 oz (91 kg)     Height:        Intake/Output Summary (Last 24 hours) at 11/09/16 0940 Last data filed at 11/09/16 1093  Gross per 24 hour  Intake              700 ml  Output             1350 ml  Net             -650 ml   Filed Weights   11/07/16 0740 11/08/16 0611 11/09/16 0500  Weight: 190 lb 9.6 oz (86.5 kg) 190 lb 11.2 oz (86.5 kg) 200 lb 9.9 oz (91 kg)    Telemetry    NSr - Personally Reviewed  ECG    No new tracing - Personally Reviewed  Physical Exam  Lethargic, a little "loopy", but oriented. Improved with stimulation and reduced O2 conc. GEN: No acute distress.   Neck: No JVD Cardiac: RRR, no murmurs, rubs, or gallops.  Respiratory:  A few wheezes LUL, reduced breath sounds LLL GI: Soft, nontender, non-distended  MS: No edema; No deformity. Neuro:  Nonfocal  Psych: Normal affect   Labs    Chemistry Recent Labs Lab 11/06/16 0554 11/06/16 1703 11/07/16 0551 11/09/16 0612  NA 136  --  136 139  K 4.3  --  4.5 5.6*  CL 99*  --  99* 100*  CO2 30  --  28 34*  GLUCOSE 399*  --  230* 192*  BUN 18  --  29* 38*  CREATININE 1.41*  --  1.41* 1.51*  CALCIUM 8.2*  --  8.8* 8.3*  PROT 6.3* 6.6  --  5.6*  ALBUMIN 2.9*  --   --  2.7*  AST 28  --   --  35  ALT 31  --   --  39  ALKPHOS 103  --   --  108  BILITOT 0.6  --   --  1.2  GFRNONAA 50*  --  50* 46*  GFRAA 58*  --  58* 53*  ANIONGAP 7  --  9 5     Hematology Recent Labs Lab 11/06/16 0554 11/07/16 0551 11/09/16 0612  WBC 12.0* 17.1* 12.0*  RBC 4.35 4.49 4.44  HGB 13.5 13.9 14.0  HCT 44.1 45.3 46.7  MCV 101.4* 100.9* 105.2*  MCH 31.0 31.0 31.5  MCHC 30.6 30.7 30.0  RDW 16.3* 16.7* 16.5*  PLT 159 205 155    Cardiac Enzymes Recent Labs Lab 11/05/16 0613 11/05/16 1156 11/05/16 1807  TROPONINI 0.03* 0.04* 0.03*    Recent Labs Lab 11/05/16 0007  TROPIPOC 0.03     BNP Recent Labs Lab 11/04/16 2357  BNP 370.4*     DDimer No results for input(s): DDIMER in the last 168 hours.   Radiology    No results found.  Cardiac Studies   CATH 11/07/16  Mid RCA-2 lesion, 30 %stenosed.  Mid RCA-1 lesion, 95 %stenosed.  Dist RCA lesion, 55 %stenosed.  Mid LAD lesion, 40 %stenosed.  Mid LAD to Dist LAD lesion, 60  %stenosed.  Dist LAD lesion, 70 %stenosed.  Prox Cx to Mid Cx lesion, 70 %stenosed.  Dist Cx lesion, 70 %stenosed.  2nd Mrg lesion, 70 %stenosed.  The left ventricular ejection fraction is 45-50% by visual estimate.  There is mild left ventricular systolic dysfunction.  LV end diastolic pressure is moderately elevated.  Mild LV dysfunction with inferior hypo-kinesis in a global ejection fraction at 45-50%.  Significant multivessel CAD with [proximal] 40% LAD stenosis with 60-70% mid LAD stenosis; segmental 70% stenosis in the proximal circumflex extending into the on 2 vessel; and high-grade 95% focal mid RCA stenosis with 30% followed by 50-60% stenosis prior to the PDA takeoff. Diagnostic Diagram          Patient Profile     66 y.o. 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  Assessment & Plan    1. CHF: fair hemodynamic compensation clinically and moderately elevated LVEDP. Will place on daily oral diuretic. Start ARB. Recheck K. 2. 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 Will discuss option for PCI (proximal RCA and proximal LAD?) with interventional cardiology, but will wait for tissue diagnosis, treatment plan and prognosis estimation by Pulmonology and Oncology. Aspirin for now, no clopidogrel until we are sure that we have adequate sample for tissue diagnosis. 3. 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 4. 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 Stable creat after cath. 5. COPD/chronic resp insuff: he is probably a CO2 retainer. Avoid O2 at >2L/min. He refused ABG. Check CXR post bronch.   Signed, Sanda Klein, MD  11/09/2016, 9:40 AM

## 2016-11-09 NOTE — Care Management Important Message (Signed)
Important Message  Patient Details  Name: Kevin Gutierrez MRN: [id number] Date of Birth: [birth year]   Medicare Important Message Given:  Yes    Orbie Pyo 11/09/2016, 12:23 PM

## 2016-11-09 NOTE — Progress Notes (Signed)
PROGRESS NOTE    Kevin Gutierrez   CBJ:[id number]  DOB: 1950/10/09  DOA: 11/05/2016 PCP: Coral Spikes, DO   Brief Narrative:  Kevin Gutierrez is a 66 y/o with COPD on 2 L O2 at home, ongoing smoking, found to have EF of 35-40% on ECHO on 7/12, CKD 3, HTN who presented to the ER for progressive dyspnea over the past few months.  In ER, CXR reveals possible L perihilar mass. CT chest reveals abrupt termination of LUL bronch into area of consolidation in LUL and lingula,  Hilar mass, small L pleural effusion with LLL consolidation, mildly preminent mediastinal nodes and mild cardiomegaly. Pulse ox in ER 67% on 2 L    Subjective: Still sleepy , aroused easily and responds to the commands , denies any complaints , looks ill and tired.  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 Continue Tx for post obstructive PNA with Zosyn/Vanc , Nubs prn . 2-ischemic cardiomyopathy ejection fraction of 35% with multi-vessel coronary artery disease :overall  poor status for any surgical intervention cardiology is planning for possible staging PCI they're waiting for the final diagnoses of the mass , again the wife prefers comfort measures for now . 3-Acute on chronic  systolic and diastolic CHF : - new diagnosis - DIURESED well with lasix , continue oral lasix . -Continue medical management per cards, hold ARB with his hyperkalemia  .  4-Hyperglycemia - in setting of steroids .    5- Essential hypertension Controlled.    6-CKD (chronic kidney disease) stage 3, GFR 30-59 ml/min  - stable.  DVT prophylaxis: Heparin Code Status: full code Family Communication: wife Disposition Plan: NOT CLEAR.   Consultants:   PCCM  Cardiology Procedures:   7/16- thoracentesis- left sided  Antimicrobials:  Anti-infectives    Start     Dose/Rate Route Frequency Ordered Stop   11/08/16 1700  vancomycin (VANCOCIN) IVPB 750 mg/150 ml premix     750 mg 150 mL/hr over 60 Minutes Intravenous Every 12 hours 11/08/16 1608     11/08/16 1600  piperacillin-tazobactam (ZOSYN) IVPB 3.375 g     3.375 g 12.5 mL/hr over 240 Minutes Intravenous Every 8 hours 11/08/16 1512     11/08/16 1600  vancomycin (VANCOCIN) IVPB 1000 mg/200 mL premix  Status:  Discontinued     1,000 mg 200 mL/hr over 60 Minutes Intravenous Every 24 hours 11/08/16 1512 11/08/16 1608   11/06/16 1000  azithromycin (ZITHROMAX) tablet 500 mg  Status:  Discontinued     500 mg Oral Daily 11/06/16 0912 11/08/16 1512   11/05/16 1830  vancomycin (VANCOCIN) IVPB 750 mg/150 ml premix  Status:  Discontinued     750 mg 150 mL/hr over 60 Minutes Intravenous Every 12 hours 11/05/16 0536  11/05/16 1220   11/05/16 0800  azithromycin (ZITHROMAX) 500 mg in dextrose 5 % 250 mL IVPB  Status:  Discontinued     500 mg 250 mL/hr over 60 Minutes Intravenous Every 24 hours 11/05/16 0522 11/06/16 0912   11/05/16 0600  cefTRIAXone (ROCEPHIN) 1 g in dextrose 5 % 50 mL IVPB  Status:  Discontinued     1 g 100 mL/hr over 30 Minutes Intravenous Every 24 hours 11/05/16 0522 11/08/16 1512   11/05/16 0600  vancomycin (VANCOCIN) 1,500 mg in sodium chloride 0.9 % 500 mL IVPB     1,500 mg 250 mL/hr over 120 Minutes Intravenous   Once 11/05/16 0534 11/05/16 0926       Objective: Vitals:   11/09/16 0700 11/09/16 0926 11/09/16 1200 11/09/16 1500  BP: (!) 174/70  (!) 155/68 (!) 137/58  Pulse: 77  72 73  Resp: 17  (!) 21 15  Temp: (!) 96.8 F (36 C)  98 F (36.7 C) 98.4 F (36.9 C)  TempSrc: Axillary  Oral Oral  SpO2: 93% 95% 90% 94%  Weight:      Height:        Intake/Output Summary (Last 24 hours) at 11/09/16 1637 Last data filed at 11/09/16 1400  Gross per 24 hour  Intake              240 ml  Output             1420 ml  Net            -1180 ml   Filed Weights   11/07/16 0740 11/08/16 0611 11/09/16 0500  Weight: 86.5 kg (190 lb 9.6 oz) 86.5 kg (190 lb 11.2 oz) 91 kg (200 lb 9.9 oz)    Examination: General exam: NAD. HEENT: PERRLA, oral mucosa moist, no sclera icterus or thrush Respiratory system: CTAB . Cardiovascular system: RRR,S1S2. Gastrointestinal system: Abdomen soft, non-tender, nondistended. Normal bowel sound. No organomegaly Central nervous system: Alert and oriented. No focal neurological deficits. Extremities: No cyanosis, clubbing - 1 + pitting edema Skin: No rashes or ulcers Psychiatry:  Mood & affect appropriate.     Data Reviewed: I have personally reviewed following labs and imaging studies  CBC:  Recent Labs Lab 11/04/16 2357 11/06/16 0554 11/07/16 0551 11/09/16 0612  WBC 8.8 12.0* 17.1* 12.0*  NEUTROABS 6.2  --   --   --   HGB 14.0 13.5 13.9 14.0  HCT 44.8 44.1 45.3 46.7  MCV 100.0 101.4* 100.9* 105.2*  PLT 181 159 205 662   Basic Metabolic Panel:  Recent Labs Lab 11/05/16 0838 11/06/16 0554 11/07/16 0551 11/09/16 0612 11/09/16 1142  NA 140 136 136 139 139  K 4.1 4.3 4.5 5.6* 5.4*  CL 103 99* 99* 100* 99*  CO2 28 30 28  34* 31  GLUCOSE 126* 399* 230* 192* 187*  BUN 12 18 29* 38* 40*  CREATININE 1.40* 1.41* 1.41* 1.51* 1.45*  CALCIUM 8.7* 8.2* 8.8* 8.3* 8.4*   GFR: Estimated Creatinine Clearance: 54.3 mL/min (A) (by C-G formula based on SCr of  1.45 mg/dL (H)). Liver Function Tests:  Recent Labs Lab 11/06/16 0554 11/06/16 1703 11/09/16 0612  AST 28  --  35  ALT 31  --  39  ALKPHOS 103  --  108  BILITOT 0.6  --  1.2  PROT 6.3* 6.6 5.6*  ALBUMIN 2.9*  --  2.7*   No results for input(s): LIPASE, AMYLASE in the last 168 hours. No results for input(s): AMMONIA  in the last 168 hours. Coagulation Profile: No results for input(s): INR, PROTIME in the last 168 hours. Cardiac Enzymes:  Recent Labs Lab 11/05/16 0613 11/05/16 1156 11/05/16 1807  TROPONINI 0.03* 0.04* 0.03*   BNP (last 3 results) No results for input(s): PROBNP in the last 8760 hours. HbA1C:  Recent Labs  11/07/16 0955  HGBA1C 7.1*   CBG:  Recent Labs Lab 11/08/16 1340 11/08/16 1641 11/08/16 2117 11/09/16 0608 11/09/16 1151  GLUCAP 189* 159* 136* 179* 182*   Lipid Profile:  Recent Labs  11/06/16 1703  CHOL 122   Thyroid Function Tests: No results for input(s): TSH, T4TOTAL, FREET4, T3FREE, THYROIDAB in the last 72 hours. Anemia Panel: No results for input(s): VITAMINB12, FOLATE, FERRITIN, TIBC, IRON, RETICCTPCT in the last 72 hours. Urine analysis:    Component Value Date/Time   COLORURINE YELLOW (A) 07/22/2016 0748   APPEARANCEUR CLEAR (A) 07/22/2016 0748   APPEARANCEUR Clear 03/15/2016 0853   LABSPEC 1.009 07/22/2016 0748   PHURINE 6.0 07/22/2016 0748   GLUCOSEU NEGATIVE 07/22/2016 0748   HGBUR MODERATE (A) 07/22/2016 0748   BILIRUBINUR NEGATIVE 07/22/2016 0748   BILIRUBINUR Negative 03/15/2016 [place] 07/22/2016 0748   PROTEINUR 30 (A) 07/22/2016 0748   NITRITE NEGATIVE 07/22/2016 0748   LEUKOCYTESUR NEGATIVE 07/22/2016 0748   LEUKOCYTESUR Negative 03/15/2016 0853   Sepsis Labs: @LABRCNTIP (procalcitonin:4,lacticidven:4) ) Recent Results (from the past 240 hour(s))  Body fluid culture     Status: None (Preliminary result)   Collection Time: 11/06/16  2:37 PM  Result Value Ref Range Status   Specimen  Description FLUID THORACENTESIS  Final   Special Requests NONE  Final   Gram Stain   Final    MODERATE WBC PRESENT, PREDOMINANTLY PMN NO ORGANISMS SEEN    Culture NO GROWTH 3 DAYS  Final   Report Status PENDING  Incomplete         Radiology Studies: No results found.    Scheduled Meds: . aspirin EC  81 mg Oral Daily  . atorvastatin  20 mg Oral QODAY  . benzonatate  100 mg Oral TID  . dextromethorphan  15 mg Oral BID  . fluticasone furoate-vilanterol  1 puff Inhalation Daily  . furosemide  40 mg Oral Daily  . heparin  5,000 Units Subcutaneous Q8H  . insulin aspart  0-5 Units Subcutaneous QHS  . insulin aspart  0-9 Units Subcutaneous TID WC  . isosorbide-hydrALAZINE  1 tablet Oral TID  . methylPREDNISolone (SOLU-MEDROL) injection  40 mg Intravenous Q12H  . metoprolol tartrate  25 mg Oral BID  . pantoprazole  40 mg Oral Daily  . sodium chloride flush  3 mL Intravenous Q12H  . sodium polystyrene  15 g Oral Once  . tiotropium  18 mcg Inhalation Daily   Continuous Infusions: . sodium chloride    . piperacillin-tazobactam (ZOSYN)  IV Stopped (11/09/16 1210)  . vancomycin 750 mg (11/09/16 0544)     LOS: 4 days    Time spent in minutes: Kevin Y U, MD [facility] Pager: [email or web address] Password [email or web address] 11/09/2016, 4:37 PM

## 2016-11-10 DIAGNOSIS — C3412 Malignant neoplasm of upper lobe, left bronchus or lung: Secondary | ICD-10-CM

## 2016-11-10 DIAGNOSIS — J90 Pleural effusion, not elsewhere classified: Secondary | ICD-10-CM

## 2016-11-10 DIAGNOSIS — J9811 Atelectasis: Secondary | ICD-10-CM

## 2016-11-10 LAB — BODY FLUID CULTURE: Culture: NO GROWTH

## 2016-11-10 LAB — GLUCOSE, CAPILLARY
GLUCOSE-CAPILLARY: 303 mg/dL — AB (ref 65–99)
GLUCOSE-CAPILLARY: 446 mg/dL — AB (ref 65–99)
GLUCOSE-CAPILLARY: 233 mg/dL — AB (ref 65–99)

## 2016-11-10 LAB — ADENOSIDE DEAMINASE, PLEURAL FL: ADENOSIDE DEAMINASE, PLEURAL FL: 3 U/L (ref 0.0–9.4)

## 2016-11-10 MED ORDER — CLONAZEPAM 0.5 MG PO TABS
0.5000 mg | ORAL_TABLET | Freq: Two times a day (BID) | ORAL | Status: DC | PRN
  Administered 2016-11-10: 0.5 mg via ORAL
  Filled 2016-11-10: qty 1

## 2016-11-10 MED ORDER — PREDNISONE 20 MG PO TABS
20.0000 mg | ORAL_TABLET | Freq: Every day | ORAL | Status: DC
  Administered 2016-11-11: 20 mg via ORAL
  Filled 2016-11-10: qty 1

## 2016-11-10 MED ORDER — INSULIN ASPART 100 UNIT/ML ~~LOC~~ SOLN
4.0000 [IU] | Freq: Once | SUBCUTANEOUS | Status: AC
  Administered 2016-11-10: 4 [IU] via SUBCUTANEOUS

## 2016-11-10 MED ORDER — DEXTROSE 5 % IV SOLN
1.0000 g | INTRAVENOUS | Status: DC
  Administered 2016-11-10: 1 g via INTRAVENOUS
  Filled 2016-11-10 (×2): qty 10

## 2016-11-10 NOTE — Progress Notes (Signed)
PROGRESS NOTE    Kevin Gutierrez   KDX:[id number]  DOB: October 27, 1950  DOA: 11/05/2016 PCP: Coral Spikes, DO   Brief Narrative:  Kevin Gutierrez is a 66 y/o with COPD on 2 L O2 at home, ongoing smoking, found to have EF of 35-40% on ECHO on 7/12, CKD 3, HTN who presented to the ER for progressive dyspnea over the past few months.  In ER, CXR reveals possible L perihilar mass. CT chest reveals abrupt termination of LUL bronch into area of consolidation in LUL and lingula,  Hilar mass, small L pleural effusion with LLL consolidation, mildly preminent mediastinal nodes and mild cardiomegaly. Pulse ox in ER 67% on 2 L    Subjective:   Alert and awake today , looks much better , denies any complaints , no SOB , no CP , no fevers or chills.  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 I also discussed the code status with her and she stated her husband does not want any CPR or intubation . Continue Tx for post obstructive  PNA with Zosyn/Vanc day #3, steroids bid , Nubs prn .  2-ischemic cardiomyopathy ejection fraction of 35% with multi-vessel coronary artery disease :overall  poor status for any surgical intervention cardiology is planning for possible staging PCI they're waiting for the final diagnoses of the mass.   3-Acute on chronic systolic and diastolic CHF : - new diagnosis - DIURESED well with lasix , continue oral lasix . -Continue medical management per cards, repeat CMP to evaluate the hyperkalemia after kayexalate.  4-Hyperglycemia - in setting of steroids , wean off the steroids tomorrow.   5- Essential hypertension Controlled.    6-CKD (chronic kidney disease) stage 3, GFR 30-59 ml/min  - stable.  DVT prophylaxis: Heparin Code Status:  DNR  Family Communication: wife Disposition Plan: NOT CLEAR.   Consultants:   PCCM  Cardiology Procedures:   7/16- thoracentesis- left sided  Antimicrobials:  Anti-infectives    Start     Dose/Rate Route Frequency Ordered Stop   11/08/16 1700  vancomycin (VANCOCIN) IVPB 750 mg/150 ml premix     750 mg 150 mL/hr over 60 Minutes Intravenous Every 12 hours 11/08/16 1608     11/08/16 1600  piperacillin-tazobactam (ZOSYN) IVPB 3.375 g     3.375 g 12.5 mL/hr over 240 Minutes Intravenous Every 8 hours 11/08/16 1512     11/08/16 1600  vancomycin (VANCOCIN) IVPB 1000 mg/200 mL premix  Status:  Discontinued     1,000  mg 200 mL/hr over 60 Minutes Intravenous Every 24 hours 11/08/16 1512 11/08/16 1608   11/06/16 1000  azithromycin (ZITHROMAX) tablet 500 mg  Status:  Discontinued     500 mg Oral Daily 11/06/16 0912 11/08/16 1512   11/05/16 1830  vancomycin (VANCOCIN) IVPB 750 mg/150 ml premix  Status:  Discontinued     750 mg 150 mL/hr over 60 Minutes Intravenous Every 12 hours 11/05/16 0536 11/05/16 1220   11/05/16 0800  azithromycin (ZITHROMAX) 500 mg in dextrose 5 % 250 mL IVPB  Status:  Discontinued     500 mg 250 mL/hr over 60 Minutes  Intravenous Every 24 hours 11/05/16 0522 11/06/16 0912   11/05/16 0600  cefTRIAXone (ROCEPHIN) 1 g in dextrose 5 % 50 mL IVPB  Status:  Discontinued     1 g 100 mL/hr over 30 Minutes Intravenous Every 24 hours 11/05/16 0522 11/08/16 1512   11/05/16 0600  vancomycin (VANCOCIN) 1,500 mg in sodium chloride 0.9 % 500 mL IVPB     1,500 mg 250 mL/hr over 120 Minutes Intravenous  Once 11/05/16 0534 11/05/16 0926       Objective: Vitals:   11/10/16 0104 11/10/16 0200 11/10/16 0729 11/10/16 1012  BP:  (!) 98/53 (!) 168/56   Pulse: 78 81 78   Resp: 19 20 20    Temp: 97.6 F (36.4 C)  97.7 F (36.5 C)   TempSrc: Oral  Oral   SpO2: 91% 91% 96% 95%  Weight: 92.2 kg (203 lb 4.2 oz)     Height:        Intake/Output Summary (Last 24 hours) at 11/10/16 1124 Last data filed at 11/10/16 0759  Gross per 24 hour  Intake              800 ml  Output             2125 ml  Net            -1325 ml   Filed Weights   11/08/16 0611 11/09/16 0500 11/10/16 0104  Weight: 86.5 kg (190 lb 11.2 oz) 91 kg (200 lb 9.9 oz) 92.2 kg (203 lb 4.2 oz)    Examination: General exam: NAD. HEENT: PERRLA, oral mucosa moist, no sclera icterus or thrush Respiratory system: Decreased air entry at the left. Cardiovascular system: RRR,S1S2. Gastrointestinal system: Abdomen soft, non-tender, nondistended. Normal bowel sound. No organomegaly Central nervous system: Alert and oriented. No focal neurological deficits. Extremities: No cyanosis, clubbing - 1 + pitting edema Skin: No rashes or ulcers Psychiatry:  Mood & affect appropriate.     Data Reviewed: I have personally reviewed following labs and imaging studies  CBC:  Recent Labs Lab 11/04/16 2357 11/06/16 0554 11/07/16 0551 11/09/16 0612  WBC 8.8 12.0* 17.1* 12.0*  NEUTROABS 6.2  --   --   --   HGB 14.0 13.5 13.9 14.0  HCT 44.8 44.1 45.3 46.7  MCV 100.0 101.4* 100.9* 105.2*  PLT 181 159 205 194   Basic Metabolic Panel:  Recent Labs Lab  11/05/16 0838 11/06/16 0554 11/07/16 0551 11/09/16 0612 11/09/16 1142  NA 140 136 136 139 139  K 4.1 4.3 4.5 5.6* 5.4*  CL 103 99* 99* 100* 99*  CO2 28 30 28  34* 31  GLUCOSE 126* 399* 230* 192* 187*  BUN 12 18 29* 38* 40*  CREATININE 1.40* 1.41* 1.41* 1.51* 1.45*  CALCIUM 8.7* 8.2* 8.8* 8.3* 8.4*   GFR: Estimated Creatinine Clearance: 54.3 mL/min (A) (by C-G formula based on  SCr of 1.45 mg/dL (H)). Liver Function Tests:  Recent Labs Lab 11/06/16 0554 11/06/16 1703 11/09/16 0612  AST 28  --  35  ALT 31  --  39  ALKPHOS 103  --  108  BILITOT 0.6  --  1.2  PROT 6.3* 6.6 5.6*  ALBUMIN 2.9*  --  2.7*   No results for input(s): LIPASE, AMYLASE in the last 168 hours. No results for input(s): AMMONIA in the last 168 hours. Coagulation Profile: No results for input(s): INR, PROTIME in the last 168 hours. Cardiac Enzymes:  Recent Labs Lab 11/05/16 0613 11/05/16 1156 11/05/16 1807  TROPONINI 0.03* 0.04* 0.03*   BNP (last 3 results) No results for input(s): PROBNP in the last 8760 hours. HbA1C: No results for input(s): HGBA1C in the last 72 hours. CBG:  Recent Labs Lab 11/08/16 1641 11/08/16 2117 11/09/16 0608 11/09/16 1151 11/09/16 1700  GLUCAP 159* 136* 179* 182* 179*   Lipid Profile: No results for input(s): CHOL, HDL, LDLCALC, TRIG, CHOLHDL, LDLDIRECT in the last 72 hours. Thyroid Function Tests: No results for input(s): TSH, T4TOTAL, FREET4, T3FREE, THYROIDAB in the last 72 hours. Anemia Panel: No results for input(s): VITAMINB12, FOLATE, FERRITIN, TIBC, IRON, RETICCTPCT in the last 72 hours. Urine analysis:    Component Value Date/Time   COLORURINE YELLOW (A) 07/22/2016 0748   APPEARANCEUR CLEAR (A) 07/22/2016 0748   APPEARANCEUR Clear 03/15/2016 0853   LABSPEC 1.009 07/22/2016 0748   PHURINE 6.0 07/22/2016 0748   GLUCOSEU NEGATIVE 07/22/2016 0748   HGBUR MODERATE (A) 07/22/2016 0748   BILIRUBINUR NEGATIVE 07/22/2016 0748   BILIRUBINUR Negative  03/15/2016 [place] 07/22/2016 0748   PROTEINUR 30 (A) 07/22/2016 0748   NITRITE NEGATIVE 07/22/2016 0748   LEUKOCYTESUR NEGATIVE 07/22/2016 0748   LEUKOCYTESUR Negative 03/15/2016 0853   Sepsis Labs: @LABRCNTIP (procalcitonin:4,lacticidven:4) ) Recent Results (from the past 240 hour(s))  Body fluid culture     Status: None   Collection Time: 11/06/16  2:37 PM  Result Value Ref Range Status   Specimen Description FLUID THORACENTESIS  Final   Special Requests NONE  Final   Gram Stain   Final    MODERATE WBC PRESENT, PREDOMINANTLY PMN NO ORGANISMS SEEN    Culture NO GROWTH 3 DAYS  Final   Report Status 11/10/2016 FINAL  Final  Culture, respiratory (NON-Expectorated)     Status: None (Preliminary result)   Collection Time: 11/08/16  9:13 AM  Result Value Ref Range Status   Specimen Description BRONCHIAL ALVEOLAR LAVAGE  Final   Special Requests NONE  Final   Gram Stain   Final    MODERATE WBC PRESENT, PREDOMINANTLY PMN NO ORGANISMS SEEN    Culture PENDING  Incomplete   Report Status PENDING  Incomplete         Radiology Studies: No results found.    Scheduled Meds: . aspirin EC  81 mg Oral Daily  . atorvastatin  20 mg Oral QODAY  . benzonatate  100 mg Oral TID  . dextromethorphan  15 mg Oral BID  . fluticasone furoate-vilanterol  1 puff Inhalation Daily  . furosemide  40 mg Oral Daily  . heparin  5,000 Units Subcutaneous Q8H  . insulin aspart  0-5 Units Subcutaneous QHS  . insulin aspart  0-9 Units Subcutaneous TID WC  . isosorbide-hydrALAZINE  1 tablet Oral TID  . methylPREDNISolone (SOLU-MEDROL) injection  40 mg Intravenous Q12H  . metoprolol tartrate  25 mg Oral BID  . pantoprazole  40 mg Oral  Daily  . sodium chloride flush  3 mL Intravenous Q12H  . tiotropium  18 mcg Inhalation Daily   Continuous Infusions: . sodium chloride    . piperacillin-tazobactam (ZOSYN)  IV 3.375 g (11/10/16 0755)  . vancomycin Stopped (11/10/16 0600)      LOS: 5 days    Time spent in minutes: [place], MD [facility] Pager: [email or web address] Password [email or web address] 11/10/2016, 11:24 AM

## 2016-11-10 NOTE — Progress Notes (Signed)
Progress Note  Patient Name: Kevin Gutierrez Date of Encounter: 11/10/2016  Primary Cardiologist: New  Subjective   Denies angina. No dyspnea at rest, cough has resolved. 95% O2 sat on room air. Alert, oriented.  Pathology report shows small cell carcinoma. This report came back after I had my discussion with the patient and spouse and I have not yet had the opportunity to inform him of it.  Inpatient Medications    Scheduled Meds: . aspirin EC  81 mg Oral Daily  . atorvastatin  20 mg Oral QODAY  . benzonatate  100 mg Oral TID  . dextromethorphan  15 mg Oral BID  . fluticasone furoate-vilanterol  1 puff Inhalation Daily  . furosemide  40 mg Oral Daily  . heparin  5,000 Units Subcutaneous Q8H  . insulin aspart  0-5 Units Subcutaneous QHS  . insulin aspart  0-9 Units Subcutaneous TID WC  . isosorbide-hydrALAZINE  1 tablet Oral TID  . methylPREDNISolone (SOLU-MEDROL) injection  40 mg Intravenous Q12H  . metoprolol tartrate  25 mg Oral BID  . pantoprazole  40 mg Oral Daily  . sodium chloride flush  3 mL Intravenous Q12H  . tiotropium  18 mcg Inhalation Daily   Continuous Infusions: . sodium chloride    . piperacillin-tazobactam (ZOSYN)  IV 3.375 g (11/10/16 0755)  . vancomycin Stopped (11/10/16 0600)   PRN Meds: sodium chloride, acetaminophen, albuterol, HYDROcodone-acetaminophen, ondansetron (ZOFRAN) IV, sodium chloride flush   Vital Signs    Vitals:   11/10/16 0200 11/10/16 0729 11/10/16 1012 11/10/16 1143  BP: (!) 98/53 (!) 168/56  (!) 189/76  Pulse: 81 78  72  Resp: 20 20  18   Temp:  97.7 F (36.5 C)  98 F (36.7 C)  TempSrc:  Oral  Oral  SpO2: 91% 96% 95% 92%  Weight:      Height:        Intake/Output Summary (Last 24 hours) at 11/10/16 1406 Last data filed at 11/10/16 1401  Gross per 24 hour  Intake              510 ml  Output             2625 ml  Net            -2115 ml   Filed Weights   11/08/16 0611 11/09/16 0500 11/10/16 0104  Weight: 190 lb  11.2 oz (86.5 kg) 200 lb 9.9 oz (91 kg) 203 lb 4.2 oz (92.2 kg)    Telemetry    NSR - Personally Reviewed  ECG    No new ECG - Personally Reviewed  Physical Exam  Calm, not taliking a lot GEN: No acute distress.   Neck: No JVD Cardiac: RRR, no murmurs, rubs, or gallops.  Respiratory: Diminished breath sounds entire L lung, especially at base, a few rhonchi R Clear to auscultation. GI: Soft, nontender, non-distended  MS: No edema; No deformity. Neuro:  Nonfocal  Psych: Normal affect   Labs    Chemistry Recent Labs Lab 11/06/16 0554 11/06/16 1703 11/07/16 0551 11/09/16 0612 11/09/16 1142  NA 136  --  136 139 139  K 4.3  --  4.5 5.6* 5.4*  CL 99*  --  99* 100* 99*  CO2 30  --  28 34* 31  GLUCOSE 399*  --  230* 192* 187*  BUN 18  --  29* 38* 40*  CREATININE 1.41*  --  1.41* 1.51* 1.45*  CALCIUM 8.2*  --  8.8* 8.3*  8.4*  PROT 6.3* 6.6  --  5.6*  --   ALBUMIN 2.9*  --   --  2.7*  --   AST 28  --   --  35  --   ALT 31  --   --  39  --   ALKPHOS 103  --   --  108  --   BILITOT 0.6  --   --  1.2  --   GFRNONAA 50*  --  50* 46* 48*  GFRAA 58*  --  58* 53* 56*  ANIONGAP 7  --  9 5 9      Hematology Recent Labs Lab 11/06/16 0554 11/07/16 0551 11/09/16 0612  WBC 12.0* 17.1* 12.0*  RBC 4.35 4.49 4.44  HGB 13.5 13.9 14.0  HCT 44.1 45.3 46.7  MCV 101.4* 100.9* 105.2*  MCH 31.0 31.0 31.5  MCHC 30.6 30.7 30.0  RDW 16.3* 16.7* 16.5*  PLT 159 205 155    Cardiac Enzymes Recent Labs Lab 11/05/16 0613 11/05/16 1156 11/05/16 1807  TROPONINI 0.03* 0.04* 0.03*    Recent Labs Lab 11/05/16 0007  TROPIPOC 0.03     BNP Recent Labs Lab 11/04/16 2357  BNP 370.4*     DDimer No results for input(s): DDIMER in the last 168 hours.   Radiology    No results found.  Cardiac Studies   CATH 11/07/16  Mid RCA-2 lesion, 30 %stenosed.  Mid RCA-1 lesion, 95 %stenosed.  Dist RCA lesion, 55 %stenosed.  Mid LAD lesion, 40 %stenosed.  Mid LAD to Dist LAD  lesion, 60 %stenosed.  Dist LAD lesion, 70 %stenosed.  Prox Cx to Mid Cx lesion, 70 %stenosed.  Dist Cx lesion, 70 %stenosed.  2nd Mrg lesion, 70 %stenosed.  The left ventricular ejection fraction is 45-50% by visual estimate.  There is mild left ventricular systolic dysfunction.  LV end diastolic pressure is moderately elevated.  Mild LV dysfunction with inferior hypo-kinesis in a global ejection fraction at 45-50%.  Significant multivessel CAD with [proximal]40% LAD stenosis with 60-70% mid LAD stenosis; segmental 70% stenosis in the proximal circumflex extending into the on 2 vessel; and high-grade 95% focal mid RCA stenosis with 30% followed by 50-60% stenosis prior to the PDA takeoff. Diagnostic Diagram          Patient Profile     66 y.o. 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 CHF: fair hemodynamic compensation clinically and moderately elevated LVEDP. On daily oral diuretic. Hydralazine/nitrates for vasodilator therapy for heart failure. 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 2. CAD: anatomy is appropriate for surgery, but he is a poor candidate due to advanced COPD (FEV1 59% last year) and secondary to what may turn out to be stage IV small cell lung Ca. Will discuss option for PCI (proximal RCA and proximal LAD?) with interventional cardiology, but will wait for full tumor staging, treatment plan and prognosis estimation by Pulmonology and Oncology. Aspirin for now, will add clopidogrel less and as we are sure he will not have further invasive procedures for his neoplasm. 3.LUL bronchus small cell carcinoma with probable pleural involvement: Prognosis is likely to be poor, but tumor pathology suggests he will be chemotherapy/radiation responsive. 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 Stable creat after cath. 5. COPD/chronic  resp insuff: he is probably a CO2 retainer. Avoid O2 at >2L/min. He refused ABG. Check CXR post bronch.  Signed, Sanda Klein, MD  11/10/2016, 2:06 PM

## 2016-11-10 NOTE — Consult Note (Signed)
Name: Kevin Gutierrez MRN: [id number] DOB: 06-05-1950    ADMISSION DATE:  11/05/2016 CONSULTATION DATE:  7/15 REFERRING MD :  Maudie Mercury  CHIEF COMPLAINT: abnormal CT chest. Also effusion   BRIEF PATIENT DESCRIPTION:   This is a 66 year old male f/b [place] for chronic respiratory failure in setting of COPD (on home oxygen), ckd stage III, and HFrEF (35-40%) and also has h/o basal cell carcinoma. Presented to ED w/ what he describes as progressive dyspnea since May 2018. Had been started on Levaquin on 7/9 for pneumonia w/ planned 10d course. In the ER pulse ox 67% on 2 liters, nml wbc ct, afebrile but CT chest raised concern for LUL consolidation w/ abrupt termination of the lul. Also concern for hilar mass and LLL consolidation w/ element of effusion. PCCM was asked to see re: this abnormal CT chest   SIGNIFICANT EVENTS  Bronch 7/20 path back with small cell  STUDIES:  CT chest 7/15: There is a small left pleural effusion. Left lower lobe consolidative changes may represent atelectasis versus infiltrate. There is consolidative changes of the left upper lobe and the lingula. There is abrupt termination of the left upper lobe bronchus. Findings may represent postobstructive atelectasis versus pneumonia. There is fullness of the left hilar soft tissue concerning for underlying mass. Evaluation of the hilum is limited on this noncontrast CT. Correlation with clinical exam and follow-up to resolution or further evaluation with CT with IV contrast recommended. There is background of emphysema. Right lung base atelectasis/scarring noted.   SUBJECTIVE:  No distress No hemoptysis   VITAL SIGNS: Temp:  [97.6 F (36.4 C)-98.1 F (36.7 C)] 98.1 F (36.7 C) (07/20 1520) Pulse Rate:  [72-92] 92 (07/20 1520) Resp:  [17-23] 18 (07/20 1143) BP: (98-189)/(51-84) 125/79 (07/20 1520) SpO2:  [88 %-96 %] 88 % (07/20 1520) Weight:  [91.5 kg (201 lb 12.8 oz)-92.2 kg (203 lb 4.2 oz)] 91.5 kg (201 lb 12.8 oz)  (07/20 1520)  PHYSICAL EXAMINATION: General: awake , no distress in bed Neuro: awake, nonfocal, flat affect a little HEENT: no lymphad, no stridor PULM: reduced BS left apical, lung distant cta CV: s1 s2RRR GI:  Soft, BS wnl, no r Extremities: no edema    Recent Labs Lab 11/07/16 0551 11/09/16 0612 11/09/16 1142  NA 136 139 139  K 4.5 5.6* 5.4*  CL 99* 100* 99*  CO2 28 34* 31  BUN 29* 38* 40*  CREATININE 1.41* 1.51* 1.45*  GLUCOSE 230* 192* 187*    Recent Labs Lab 11/06/16 0554 11/07/16 0551 11/09/16 0612  HGB 13.5 13.9 14.0  HCT 44.1 45.3 46.7  WBC 12.0* 17.1* 12.0*  PLT 159 205 155   No results found. abx Ceftriaxone 7/14>>> vanc 7/15>>> azith 7/15>>>  ASSESSMENT / PLAN:  Acute on chronic hypoxic respiratory failure in setting of CAP vs post-obstructive atx +/- pna. Possible hilar lung mass SMALL CELL LUNG CANCER with obstruction Mod-large Left effusion (Verified by [place] chest 7/15) COPD w/ AECOPD  HFrEF (35-40%) Stage III CKD LE edema   Plan Small cell lung cancer confirmed- I discussed the results with pt and wife in room Given obstruction, would treat abx 8 days, consider narrow to CTX, dc Vosyn Would consult medical oncology for palliative chemotherpay Given not NSCLC, radiation may not be super responsive, but would have radiation oncology input on this Slow taper steroids DNR appropriate, prognosis is poor, d/w wife and pt  Will sign off, call if needed  Lavon Paganini. Titus Mould, MD,  FACP Pgr: [id number] [facility]

## 2016-11-10 NOTE — Progress Notes (Signed)
The patients evening cbg is > 400. Notified MD Burnis Medin ordered extra insulin. Verbal to call night coverage if cbg is >400 on the HS check.   Saddie Benders RN

## 2016-11-10 NOTE — Progress Notes (Signed)
Offered Pt a bath. Pt declined but allowed tech to perform peri care. Wife brought Pt regular clothes to put on, tech assisted Pt getting dressed.

## 2016-11-10 NOTE — Evaluation (Signed)
Physical Therapy Evaluation Patient Details Name: Kevin Gutierrez MRN: [id number] DOB: Aug 31, 1950 Today's Date: 11/10/2016   History of Present Illness  Pt is a 66 yo male admitted through ED on 11/05/16 with progressive dyspnea. Pt had a CT that revealed mass and consolidation in left lung. Pt underwent a bronchoscopy on 11/08/16 and is still awaiting the results. PMH significant for COPD, current smoker, EF 35-40& as of 11/02/16, CKD3, HTN.    Clinical Impression  Pt presents with the above diagnosis and below deficits for therapy evaluation. Prior to admission, pt lived with his wife in an apartment with an elevator leading to the main level. Pt was completely independent, but O2 dependent prior to admission. Pt requires Min guard for all mobility this session and has significant instability which puts him at a higher risk for falls without assistance. Pt will benefit from continued acute follow-up in order to address the below deficits piror to discharge.     Follow Up Recommendations No PT follow up    Equipment Recommendations  Rolling walker with 5" wheels;3in1 (PT)    Recommendations for Other Services       Precautions / Restrictions Precautions Precautions: Fall Restrictions Weight Bearing Restrictions: No      Mobility  Bed Mobility Overal bed mobility: Needs Assistance Bed Mobility: Supine to Sit     Supine to sit: Supervision     General bed mobility comments: supervision for safety to EOB  Transfers Overall transfer level: Needs assistance Equipment used: None Transfers: Sit to/from Stand Sit to Stand: Min guard         General transfer comment: Min guard for safety  Ambulation/Gait Ambulation/Gait assistance: Min assist Ambulation Distance (Feet): 40 Feet Assistive device: 1 person hand held assist Gait Pattern/deviations: Step-through pattern;Decreased stride length Gait velocity: decreased Gait velocity interpretation: at or above normal speed for  age/gender General Gait Details: slow cadence, mild unsteady gait  Stairs            Wheelchair Mobility    Modified Rankin (Stroke Patients Only)       Balance Overall balance assessment: Needs assistance Sitting-balance support: No upper extremity supported;Feet supported Sitting balance-Leahy Scale: Good     Standing balance support: No upper extremity supported;During functional activity Standing balance-Leahy Scale: Fair                               Pertinent Vitals/Pain Pain Assessment: No/denies pain    Home Living Family/patient expects to be discharged to:: Private residence Living Arrangements: Spouse/significant other Available Help at Discharge: Family;Available 24 hours/day Type of Home: Apartment Home Access: Elevator     Home Layout: One level Home Equipment: None      Prior Function Level of Independence: Independent               Hand Dominance   Dominant Hand: Right    Extremity/Trunk Assessment   Upper Extremity Assessment Upper Extremity Assessment: Defer to OT evaluation    Lower Extremity Assessment Lower Extremity Assessment: Generalized weakness    Cervical / Trunk Assessment Cervical / Trunk Assessment: Normal  Communication   Communication: No difficulties  Cognition Arousal/Alertness: Awake/alert Behavior During Therapy: WFL for tasks assessed/performed Overall Cognitive Status: Within Functional Limits for tasks assessed  General Comments      Exercises     Assessment/Plan    PT Assessment Patient needs continued PT services  PT Problem List Decreased strength;Decreased activity tolerance;Decreased balance;Decreased mobility;Decreased knowledge of use of DME       PT Treatment Interventions DME instruction;Gait training;Functional mobility training;Therapeutic activities;Therapeutic exercise;Balance training    PT Goals (Current  goals can be found in the Care Plan section)  Acute Rehab PT Goals Patient Stated Goal: to get back home PT Goal Formulation: With patient Time For Goal Achievement: 11/17/16 Potential to Achieve Goals: Fair    Frequency Min 3X/week   Barriers to discharge        Co-evaluation               AM-PAC PT "6 Clicks" Daily Activity  Outcome Measure Difficulty turning over in bed (including adjusting bedclothes, sheets and blankets)?: None Difficulty moving from lying on back to sitting on the side of the bed? : None Difficulty sitting down on and standing up from a chair with arms (e.g., wheelchair, bedside commode, etc,.)?: Total Help needed moving to and from a bed to chair (including a wheelchair)?: A Little Help needed walking in hospital room?: A Little Help needed climbing 3-5 steps with a railing? : A Little 6 Click Score: 18    End of Session Equipment Utilized During Treatment: Gait belt;Oxygen Activity Tolerance: Patient tolerated treatment well Patient left: in bed;with call bell/phone within reach Nurse Communication: Mobility status PT Visit Diagnosis: Other abnormalities of gait and mobility (R26.89);Difficulty in walking, not elsewhere classified (R26.2)    Time: 9373-4287 PT Time Calculation (min) (ACUTE ONLY): 17 min   Charges:   PT Evaluation $PT Eval Moderate Complexity: 1 Procedure     PT G Codes:        Scheryl Marten PT, DPT  [phone]   Shanon Rosser 11/10/2016, 3:03 PM

## 2016-11-10 NOTE — Progress Notes (Addendum)
Palliative Medicine RN Note:  Pt will likely not be seen by PMT until later today. Per Dr Iran Ouch note from yesterday evening, patient and family do not wish to seek curative care for his cancer. Comfort is goal, and they want to go home. Patient is appropriate for hospice care by chart review.   Called RN CM Neoma Laming. Discussed notes from physician. She will initiate hospice referral, as that is in line with patient and family's stated goals. She will call PMT if the family needs PMT to talk with them/help with [place], but this seems to be resolved for now.  We will shadow the chart and wait for Deborah's call if she needs [place].  Marjie Skiff Jeffrie Stander, RN, BSN, [facility] 11/10/2016 9:49 AM Cell [phone] 8:00-4:00 Monday-Friday Office [phone]  1006 Rec'd a call from attending. Pt's wife has completely changed her mind and is now in denial. He requested I cancel PMT referral, and he will re-consult when and if he would like [place] to see Mr Blakeman.  Marjie Skiff Winslow Verrill, RN, BSN, [facility] 11/10/2016 10:07 AM Cell [phone] 8:00-4:00 Monday-Friday Office [phone]

## 2016-11-11 ENCOUNTER — Encounter (HOSPITAL_COMMUNITY): Payer: Self-pay | Admitting: Family Medicine

## 2016-11-11 DIAGNOSIS — R739 Hyperglycemia, unspecified: Secondary | ICD-10-CM

## 2016-11-11 DIAGNOSIS — C3412 Malignant neoplasm of upper lobe, left bronchus or lung: Secondary | ICD-10-CM

## 2016-11-11 DIAGNOSIS — R600 Localized edema: Secondary | ICD-10-CM

## 2016-11-11 DIAGNOSIS — E119 Type 2 diabetes mellitus without complications: Secondary | ICD-10-CM

## 2016-11-11 LAB — VANCOMYCIN, TROUGH: VANCOMYCIN TR: 11 ug/mL — AB (ref 15–20)

## 2016-11-11 LAB — BASIC METABOLIC PANEL
ANION GAP: 8 (ref 5–15)
BUN: 27 mg/dL — ABNORMAL HIGH (ref 6–20)
CO2: 38 mmol/L — ABNORMAL HIGH (ref 22–32)
Calcium: 8.4 mg/dL — ABNORMAL LOW (ref 8.9–10.3)
Chloride: 92 mmol/L — ABNORMAL LOW (ref 101–111)
Creatinine, Ser: 1.28 mg/dL — ABNORMAL HIGH (ref 0.61–1.24)
GFR calc Af Amer: >60 mL/min (ref >60)
GFR, EST NON AFRICAN AMERICAN: 56 mL/min — AB (ref >60)
GLUCOSE: 207 mg/dL — AB (ref 65–99)
POTASSIUM: 3.3 mmol/L — AB (ref 3.5–5.1)
Sodium: 138 mmol/L (ref 135–145)

## 2016-11-11 LAB — GLUCOSE, CAPILLARY
GLUCOSE-CAPILLARY: 215 mg/dL — AB (ref 65–99)
Glucose-Capillary: 230 mg/dL — ABNORMAL HIGH (ref 65–99)

## 2016-11-11 MED ORDER — FUROSEMIDE 40 MG PO TABS: 40.0000 mg | ORAL_TABLET | Freq: Every day | ORAL | 0 refills | Status: DC

## 2016-11-11 MED ORDER — PREDNISONE 20 MG PO TABS: 20.0000 mg | ORAL_TABLET | Freq: Every day | ORAL | 0 refills | Status: AC

## 2016-11-11 MED ORDER — DEXTROMETHORPHAN POLISTIREX ER 30 MG/5ML PO SUER: 15.0000 mg | Freq: Two times a day (BID) | ORAL | 0 refills | Status: DC

## 2016-11-11 MED ORDER — METOPROLOL TARTRATE 25 MG PO TABS: 25.0000 mg | ORAL_TABLET | Freq: Two times a day (BID) | ORAL | 0 refills | Status: AC

## 2016-11-11 MED ORDER — BLOOD GLUCOSE METER KIT: PACK | 0 refills | Status: AC

## 2016-11-11 MED ORDER — GLIPIZIDE ER 5 MG PO TB24: 5.0000 mg | ORAL_TABLET | Freq: Every day | ORAL | 0 refills | Status: DC

## 2016-11-11 MED ORDER — DOXYCYCLINE HYCLATE 100 MG PO CAPS: 100.0000 mg | ORAL_CAPSULE | Freq: Two times a day (BID) | ORAL | 0 refills | Status: DC

## 2016-11-11 MED ORDER — ISOSORB DINITRATE-HYDRALAZINE 20-37.5 MG PO TABS: 1.0000 | ORAL_TABLET | Freq: Three times a day (TID) | ORAL | 0 refills | Status: AC

## 2016-11-11 MED ORDER — INSULIN GLARGINE 100 UNIT/ML ~~LOC~~ SOLN
15.0000 [IU] | SUBCUTANEOUS | Status: DC
  Administered 2016-11-11: 15 [IU] via SUBCUTANEOUS
  Filled 2016-11-11: qty 0.15

## 2016-11-11 MED ORDER — INSULIN ASPART 100 UNIT/ML ~~LOC~~ SOLN
5.0000 [IU] | Freq: Three times a day (TID) | SUBCUTANEOUS | Status: DC
  Administered 2016-11-11: 5 [IU] via SUBCUTANEOUS

## 2016-11-11 NOTE — Progress Notes (Signed)
The patient and wife have been given discharge instructions along with a new medication list and what to take today. He has follow up appointments and appointments to make; prescriptions to pick up and glucose supplies to get. They have been educated on cbg checks and S&S of hyperglycemia as well as hypo. He will be discharging with his wife via car.  Saddie Benders RN

## 2016-11-11 NOTE — Discharge Summary (Signed)
Physician Discharge Summary  Kevin Gutierrez FWY:[id number] DOB: October 29, 1950 DOA: 11/05/2016  PCP: Kevin Spikes, DO  Admit date: 11/05/2016 Discharge date: 11/11/2016  Admitted From: Home  Disposition: Home   Recommendations for Outpatient Follow-up:  1. Follow up with PCP in 1 weeks 2. Please obtain BMP/CBC in one week 3. Please monitor blood glucose and adjust meds as needed 4. Please follow up with Dr. Julien Gutierrez oncology for treatment of lung cancer in 1 week  Discharge Condition: Stable   CODE STATUS: DNR    Brief Hospitalization Summary: Please see all hospital notes, images, labs for full details of the hospitalization.  Kevin Gutierrez  is a 66 y.o. male, w Copd on home o2, ckd stage3, hypertension, cardiomyopathy (EF 35-40%)  C/o worsening dyspnea since May 2018.  Pt went to ER on Sunday , CXR => pneumonia, and tx with pneumonia.   Pox today 67% on 2L. + bilateral lower ext edema  Therefore presented to ED for evaluation.   In ED, CT chest=>  1. Apparent abrupt termination of the left upper lobe bronchus with large area consolidative changes in the left upper lobe and primarily lingula. Findings may represent postobstructive atelectasis versus pneumonia. There is fullness of the left hilar soft tissue concerning for a hilar mass. Evaluation is very limited on this noncontrast CT. Follow-up to resolution or further evaluation with CT with IV contrast recommended. 2. Small left pleural effusion and left lower lobe consolidative changes which may represent atelectasis versus infiltrate. 3. Mild cardiomegaly. 4. Multiple mildly prominent mediastinal lymph nodes  Brief Narrative:  Kevin Gutierrez is a 66 y/o with COPD on 2 L O2 at home, ongoing smoking, found to have EF of 35-40% on ECHO on 7/12, CKD 3, HTN who presented to the ER for progressive dyspnea over the past few months.  In ER, CXR reveals possible L perihilar mass. CT chest reveals abrupt termination of LUL bronch into area  of consolidation in LUL and lingula,  Hilar mass, small L pleural effusion with LLL consolidation, mildly preminent mediastinal nodes and mild cardiomegaly. 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  I called and spoke with Dr. Marin Olp on call oncologist and patient can see Dr. Julien Gutierrez and sent message to him to arrange outpatient follow up appointment.  Pt did not want to remain in hospital and wanted to discharge today.  Did not want to have inpatient oncologist appointment.   2-ischemic cardiomyopathy ejection fraction of 35% with multi-vessel coronary artery disease :overall  poor status for any surgical intervention cardiology says patient is not a candidate for PCI or  CABG.  3-Acute on chronic systolic and diastolic CHF : - new diagnosis - DIURESED well with lasix,  continue oral lasix . -Continue medical management per cards.  Hyperkalemia resolved after treatment.   4-Type 2 Diabetes with steroid exacerbated Hyperglycemia - discharge home on glucotrol XL 5 mg with breakfast and close  outpatient follow up.     5-Essential hypertension Controlled.  6-CKD (chronic kidney disease) stage 3, GFR 30-59 ml/min - stable.  DVT prophylaxis: Heparin Code Status:  DNR  Family Communication: wife Disposition Plan: NOT CLEAR.   Consultants:   PCCM  Cardiology Procedures:   7/16- thoracentesis- left sided  Discharge Diagnoses:  Principal Problem:   Acute respiratory failure ([place]) Active Problems:   Essential hypertension   COPD exacerbation (HCC)   CKD (chronic kidney disease) stage 3, GFR 30-59 ml/min   Chronic respiratory failure (HCC)   Lower extremity edema   Community acquired pneumonia of right upper lobe of lung (HCC)   Hyperglycemia   Acute on chronic systolic congestive heart failure (HCC)   Coronary artery disease   Lung mass   Small cell lung cancer, left upper lobe (HCC)   Pleural effusion on left   Atelectasis   Type 2 diabetes mellitus ([place])    Discharge Instructions: Discharge Instructions    Increase activity slowly    Complete by:  As directed      Allergies as of 11/11/2016      Reactions   No Known Allergies       Medication List    STOP taking these medications   amLODipine 5 MG tablet Commonly known as:  NORVASC   hydrALAZINE 10 MG tablet Commonly known as:  APRESOLINE   levofloxacin 500 MG tablet Commonly known as:  LEVAQUIN   varenicline 0.5 MG X 11 & 1 MG X 42 tablet Commonly known as:  CHANTIX STARTING MONTH PAK     TAKE these medications   acetaminophen 325 MG tablet Commonly known as:  TYLENOL Take 500 mg by mouth every 6 (six) hours as needed for moderate pain, fever or headache.   albuterol 108 (90 Base) MCG/ACT inhaler Commonly known as:  VENTOLIN HFA Inhale 2 puffs into the lungs every 4 (four) hours as needed for wheezing or shortness of breath.   albuterol (2.5 MG/3ML) 0.083% nebulizer solution Commonly known as:  PROVENTIL Take 3 mLs (2.5 mg total) by nebulization every 3 (three) hours as  needed for wheezing or shortness of breath.   aspirin EC 81 MG tablet Take 1 tablet (81 mg total) by mouth daily.   atorvastatin 20 MG tablet Commonly known as:  LIPITOR Take 1 tablet (20 mg total) by mouth daily. What changed:  when to take this   blood glucose meter kit and supplies Dispense based on patient and insurance preference. Use up to four times daily as directed. (FOR ICD-10 E11.9).   clonazePAM 0.5 MG tablet Commonly known as:  KLONOPIN Take 1 tablet (0.5 mg total) by mouth 2 (two) times daily as needed for anxiety.   dextromethorphan 30 MG/5ML liquid Commonly known as:  DELSYM Take 2.5 mLs (15 mg total) by mouth 2 (two) times daily.   doxycycline 100 MG capsule Commonly known as:  VIBRAMYCIN Take 1 capsule (100 mg total) by mouth 2 (two) times daily.   fluticasone furoate-vilanterol 200-25 MCG/INH Aepb Commonly known as:  BREO ELLIPTA Inhale 1 puff into the lungs daily.   furosemide 40 MG tablet Commonly known as:  LASIX Take 1 tablet (  40 mg total) by mouth daily.   glipiZIDE 5 MG 24 hr tablet Commonly known as:  GLUCOTROL XL Take 1 tablet (5 mg total) by mouth daily with breakfast.   HYDROcodone-acetaminophen 10-325 MG tablet Commonly known as:  NORCO Take 1 tablet by mouth every 6 (six) hours as needed for moderate pain.   isosorbide-hydrALAZINE 20-37.5 MG tablet Commonly known as:  BIDIL Take 1 tablet by mouth 3 (three) times daily.   metoprolol tartrate 25 MG tablet Commonly known as:  LOPRESSOR Take 1 tablet (25 mg total) by mouth 2 (two) times daily.   pantoprazole 40 MG tablet Commonly known as:  PROTONIX Take 1 tablet (40 mg total) by mouth daily.   predniSONE 20 MG tablet Commonly known as:  DELTASONE Take 1 tablet (20 mg total) by mouth daily with breakfast.   tiotropium 18 MCG inhalation capsule Commonly known as:  SPIRIVA Place 1 capsule (18 mcg total) into inhaler and inhale daily.      Follow-up Information    Kevin Spikes,  DO. Schedule an appointment as soon as possible for a visit in 1 week(s).   Specialty:  Family Medicine Contact information: [address] [place] [address] [place] [address] [phone]        Curt Bears, MD. Schedule an appointment as soon as possible for a visit in 1 week(s).   Specialty:  Oncology Contact information: [address] [place] [address] [phone]          Allergies  Allergen Reactions  . No Known Allergies    Current Discharge Medication List    START taking these medications   Details  blood glucose meter kit and supplies Dispense based on patient and insurance preference. Use up to four times daily as directed. (FOR ICD-10 E11.9). Qty: 1 each, Refills: 0    dextromethorphan (DELSYM) 30 MG/5ML liquid Take 2.5 mLs (15 mg total) by mouth 2 (two) times daily. Qty: 89 mL, Refills: 0    doxycycline (VIBRAMYCIN) 100 MG capsule Take 1 capsule (100 mg total) by mouth 2 (two) times daily. Qty: 8 capsule, Refills: 0    furosemide (LASIX) 40 MG tablet Take 1 tablet (40 mg total) by mouth daily. Qty: 14 tablet, Refills: 0    glipiZIDE (GLUCOTROL XL) 5 MG 24 hr tablet Take 1 tablet (5 mg total) by mouth daily with breakfast. Qty: 14 tablet, Refills: 0    isosorbide-hydrALAZINE (BIDIL) 20-37.5 MG tablet Take 1 tablet by mouth 3 (three) times daily. Qty: 90 tablet, Refills: 0    metoprolol tartrate (LOPRESSOR) 25 MG tablet Take 1 tablet (25 mg total) by mouth 2 (two) times daily. Qty: 60 tablet, Refills: 0    predniSONE (DELTASONE) 20 MG tablet Take 1 tablet (20 mg total) by mouth daily with breakfast. Qty: 7 tablet, Refills: 0      CONTINUE these medications which have NOT CHANGED   Details  acetaminophen (TYLENOL) 325 MG tablet Take 500 mg by mouth every 6 (six) hours as needed for moderate pain, fever or headache.     albuterol (PROVENTIL) (2.5 MG/3ML) 0.083% nebulizer solution Take 3 mLs (2.5 mg total) by nebulization every 3  (three) hours as needed for wheezing or shortness of breath. Qty: 75 mL, Refills: 12    albuterol (VENTOLIN HFA) 108 (90 Base) MCG/ACT inhaler Inhale 2 puffs into the lungs every 4 (four) hours as needed for wheezing or shortness of breath. Qty: 1 Inhaler, Refills: 3    aspirin EC 81 MG  tablet Take 1 tablet (81 mg total) by mouth daily.    atorvastatin (LIPITOR) 20 MG tablet Take 1 tablet (20 mg total) by mouth daily. Qty: 90 tablet, Refills: 3    clonazePAM (KLONOPIN) 0.5 MG tablet Take 1 tablet (0.5 mg total) by mouth 2 (two) times daily as needed for anxiety. Qty: 60 tablet, Refills: 0    fluticasone furoate-vilanterol (BREO ELLIPTA) 200-25 MCG/INH AEPB Inhale 1 puff into the lungs daily. Qty: 1 each, Refills: 0    HYDROcodone-acetaminophen (NORCO) 10-325 MG tablet Take 1 tablet by mouth every 6 (six) hours as needed for moderate pain.    pantoprazole (PROTONIX) 40 MG tablet Take 1 tablet (40 mg total) by mouth daily. Qty: 90 tablet, Refills: 0    tiotropium (SPIRIVA) 18 MCG inhalation capsule Place 1 capsule (18 mcg total) into inhaler and inhale daily. Qty: 30 capsule, Refills: 5      STOP taking these medications     amLODipine (NORVASC) 5 MG tablet      levofloxacin (LEVAQUIN) 500 MG tablet      hydrALAZINE (APRESOLINE) 10 MG tablet      varenicline (CHANTIX STARTING MONTH PAK) 0.5 MG X 11 & 1 MG X 42 tablet         Procedures/Studies: Dg Chest 2 View  Result Date: 11/05/2016 CLINICAL DATA:  67 year old male with shortness of breath. EXAM: CHEST  2 VIEW COMPARISON:  Chest radiograph dated 10/29/2016 FINDINGS: There has been interval worsening of the airspace disease primarily involving the left mid to lower lung field. Although findings may be infectious in etiology, a left hilar mass is not expanded excluded. Clinical correlation is recommended. CT of the chest with contrast may provide better evaluation. There is a small left pleural effusion. Stable cardiac  silhouette. There is pectus excavatum deformity. No acute osseous pathology. IMPRESSION: Interval worsening of the left mid to lower lung field airspace disease well increase in the size of the left pleural effusion. Findings may be infectious in etiology. However, underlying hilar mass is not entirely excluded. Clinical correlation is recommended. CT of the chest with contrast may provide better evaluation. Electronically Signed   By: Anner Crete M.D.   On: 11/05/2016 01:16   Dg Chest 2 View  Result Date: 10/29/2016 CLINICAL DATA:  Patient with history of COPD.  Shortness of breath. EXAM: CHEST  2 VIEW COMPARISON:  Chest radiograph 03/19/2016. FINDINGS: Stable cardiomegaly. Interval development of left perihilar and left lower lung consolidation. Small left pleural effusion. No pneumothorax. Thoracic spine degenerative changes. Pectus excavatum. IMPRESSION: Interval development of extensive consolidation within the left mid and lower lung concerning for pneumonia in the appropriate clinical setting. Followup PA and lateral chest X-ray is recommended in 3-4 weeks following trial of antibiotic therapy to ensure resolution and exclude underlying malignancy. Small left pleural effusion. Electronically Signed   By: Lovey Newcomer M.D.   On: 10/29/2016 15:46   Ct Chest Wo Contrast  Result Date: 11/07/2016 CLINICAL DATA:  66 year old male with progressive dyspnea. Abnormal chest x-ray and CT. Subsequent encounter. EXAM: CT CHEST WITHOUT CONTRAST TECHNIQUE: Multidetector CT imaging of the chest was performed following the standard protocol without IV contrast. COMPARISON:  11/06/2016 chest x-ray. 11/05/2016 and 06/07/2015 chest CT. FINDINGS: Cardiovascular: Pectus deformity with cardiomegaly. Coronary artery calcifications. Atherosclerotic changes thoracic aorta. Ascending thoracic aorta measures up to 4 Cm. Mediastinum/Nodes: Mediastinal adenopathy. Lungs/Pleura: Obstruction left upper lobe bronchus with  postobstructive atelectasis/ infiltrate. Rounded fullness left hilar region raises possibly of obstructing mass (  not well delineated on present noncontrast exam). The presence of adjacent mediastinal raises possibility of tumor. Atelectasis with bronchial thickening involving portions of the lower lobes bilaterally and medial aspect of the right middle lobe. Very small left-sided pleural effusion. Upper Abdomen: Slightly lobulated contour of the liver without other findings of cirrhosis. Mild nodularity left adrenal gland. Musculoskeletal: Sclerotic appearance left humeral head suggestive of avascular necrosis. Pectus deformity. Mild degenerative changes thoracic spine with destructive lesion. IMPRESSION: Obstruction left upper lobe bronchus with postobstructive atelectasis/ infiltrate. Rounded fullness left hilar region raises possibly of obstructing mass (not well delineated on present noncontrast exam). The presence of adjacent mediastinal raises possibility of tumor. Atelectasis with bronchial thickening involving portions of the lower lobes bilaterally and medial aspect of the right middle lobe. Very small left-sided pleural effusion. Mild nodularity left adrenal gland. Ascending thoracic aorta measures up to 4 cm. Recommend annual imaging followup by CTA or MRA. This recommendation follows 2010 ACCF/AHA/AATS/ACR/ASA/SCA/SCAI/SIR/STS/SVM Guidelines for the Diagnosis and Management of Patients with Thoracic Aortic Disease. Circulation. 2010; 121: S287-G811 Sclerotic appearance left humeral head suggestive of avascular necrosis. Pectus deformity with mild cardiomegaly. Aortic Atherosclerosis (ICD10-I70.0). Electronically Signed   By: Genia Del M.D.   On: 11/07/2016 07:18   Ct Chest Wo Contrast  Result Date: 11/05/2016 CLINICAL DATA:  66 year old male with possible left hilar mass. EXAM: CT CHEST WITHOUT CONTRAST TECHNIQUE: Multidetector CT imaging of the chest was performed following the standard protocol  without IV contrast. COMPARISON:  Chest radiograph dated 11/05/2016 FINDINGS: Evaluation of this exam is limited in the absence of intravenous contrast. Cardiovascular: There is mild cardiomegaly. No pericardial effusion. There is coronary vascular calcification primarily involving the LAD, left circumflex artery. There is mild atherosclerotic calcification of the thoracic aorta. Evaluation of the aorta and pulmonary arteries is limited in the absence of intravenous contrast. Mediastinum/Nodes: Multiple top-normal mediastinal lymph nodes noted. No definite adenopathy. Evaluation of the left hilum is limited due to consolidative changes of the adjacent lung. Esophagus is grossly unremarkable. No thyroid nodules noted. Lungs/Pleura: There is a small left pleural effusion. Left lower lobe consolidative changes may represent atelectasis versus infiltrate. There is consolidative changes of the left upper lobe and the lingula. There is abrupt termination of the left upper lobe bronchus. Findings may represent postobstructive atelectasis versus pneumonia. There is fullness of the left hilar soft tissue concerning for underlying mass. Evaluation of the hilum is limited on this noncontrast CT. Correlation with clinical exam and follow-up to resolution or further evaluation with CT with IV contrast recommended. There is background of emphysema. Right lung base atelectasis/scarring noted. Upper Abdomen: No acute abnormality. Musculoskeletal: There is pectus deformity.  No acute fracture. IMPRESSION: 1. Apparent abrupt termination of the left upper lobe bronchus with large area consolidative changes in the left upper lobe and primarily lingula. Findings may represent postobstructive atelectasis versus pneumonia. There is fullness of the left hilar soft tissue concerning for a hilar mass. Evaluation is very limited on this noncontrast CT. Follow-up to resolution or further evaluation with CT with IV contrast recommended. 2.  Small left pleural effusion and left lower lobe consolidative changes which may represent atelectasis versus infiltrate. 3. Mild cardiomegaly. 4. Multiple mildly prominent mediastinal lymph nodes Electronically Signed   By: Anner Crete M.D.   On: 11/05/2016 02:25   Dg Chest Port 1 View  Result Date: 11/06/2016 CLINICAL DATA:  Left-sided thoracentesis EXAM: PORTABLE CHEST 1 VIEW COMPARISON:  Yesterday FINDINGS: No visible residual fluid. No visible pneumothorax. There  is a known left hilar mass with extensive upper lobe opacification. Cardiomegaly. Mild atelectasis at the medial right base. IMPRESSION: No acute finding after thoracentesis. Known left upper lobe mass and collapse. Electronically Signed   By: Monte Fantasia M.D.   On: 11/06/2016 14:23     Subjective: Pt says he wants to go home today.  Feels better and will follow up with oncology outpatient.    Discharge Exam: Vitals:   11/11/16 0804 11/11/16 1355  BP: 132/72 97/63  Pulse: 89 88  Resp:  16  Temp:  98.3 F (36.8 C)   Vitals:   11/11/16 0431 11/11/16 0804 11/11/16 1228 11/11/16 1355  BP: 123/72 132/72  97/63  Pulse: 79 89  88  Resp: 16   16  Temp: 98.4 F (36.9 C)   98.3 F (36.8 C)  TempSrc: Oral   Oral  SpO2: 94%  95% 90%  Weight: 89 kg (196 lb 3.2 oz)     Height:      General: Pt is alert, awake, not in acute distress Cardiovascular: RRR, S1/S2 +, no rubs, no gallops Respiratory: CTA bilaterally, no wheezing, no rhonchi Abdominal: Soft, NT, ND, bowel sounds + Extremities:  no cyanosis   The results of significant diagnostics from this hospitalization (including imaging, microbiology, ancillary and laboratory) are listed below for reference.     Microbiology: Recent Results (from the past 240 hour(s))  Body fluid culture     Status: None   Collection Time: 11/06/16  2:37 PM  Result Value Ref Range Status   Specimen Description FLUID THORACENTESIS  Final   Special Requests NONE  Final   Gram Stain    Final    MODERATE WBC PRESENT, PREDOMINANTLY PMN NO ORGANISMS SEEN    Culture NO GROWTH 3 DAYS  Final   Report Status 11/10/2016 FINAL  Final  Culture, respiratory (NON-Expectorated)     Status: None (Preliminary result)   Collection Time: 11/08/16  9:13 AM  Result Value Ref Range Status   Specimen Description BRONCHIAL ALVEOLAR LAVAGE  Final   Special Requests NONE  Final   Gram Stain   Final    MODERATE WBC PRESENT, PREDOMINANTLY PMN NO ORGANISMS SEEN    Culture RARE STAPHYLOCOCCUS AUREUS  Final   Report Status PENDING  Incomplete     Labs: BNP (last 3 results)  Recent Labs  11/04/16 2357  BNP 001.7*   Basic Metabolic Panel:  Recent Labs Lab 11/06/16 0554 11/07/16 0551 11/09/16 0612 11/09/16 1142 11/11/16 0553  NA 136 136 139 139 138  K 4.3 4.5 5.6* 5.4* 3.3*  CL 99* 99* 100* 99* 92*  CO2 30 28 34* 31 38*  GLUCOSE 399* 230* 192* 187* 207*  BUN 18 29* 38* 40* 27*  CREATININE 1.41* 1.41* 1.51* 1.45* 1.28*  CALCIUM 8.2* 8.8* 8.3* 8.4* 8.4*   Liver Function Tests:  Recent Labs Lab 11/06/16 0554 11/06/16 1703 11/09/16 0612  AST 28  --  35  ALT 31  --  39  ALKPHOS 103  --  108  BILITOT 0.6  --  1.2  PROT 6.3* 6.6 5.6*  ALBUMIN 2.9*  --  2.7*   No results for input(s): LIPASE, AMYLASE in the last 168 hours. No results for input(s): AMMONIA in the last 168 hours. CBC:  Recent Labs Lab 11/04/16 2357 11/06/16 0554 11/07/16 0551 11/09/16 0612  WBC 8.8 12.0* 17.1* 12.0*  NEUTROABS 6.2  --   --   --   HGB 14.0 13.5 13.9  14.0  HCT 44.8 44.1 45.3 46.7  MCV 100.0 101.4* 100.9* 105.2*  PLT 181 159 205 155   Cardiac Enzymes:  Recent Labs Lab 11/05/16 0613 11/05/16 1156 11/05/16 1807  TROPONINI 0.03* 0.04* 0.03*   BNP: Invalid input(s): POCBNP CBG:  Recent Labs Lab 11/10/16 1232 11/10/16 1746 11/10/16 2143 11/11/16 0722 11/11/16 1123  GLUCAP 303* 446* 233* 230* 215*   D-Dimer No results for input(s): DDIMER in the last 72 hours. Hgb  A1c No results for input(s): HGBA1C in the last 72 hours. Lipid Profile No results for input(s): CHOL, HDL, LDLCALC, TRIG, CHOLHDL, LDLDIRECT in the last 72 hours. Thyroid function studies No results for input(s): TSH, T4TOTAL, T3FREE, THYROIDAB in the last 72 hours.  Invalid input(s): FREET3 Anemia work up No results for input(s): VITAMINB12, FOLATE, FERRITIN, TIBC, IRON, RETICCTPCT in the last 72 hours. Urinalysis    Component Value Date/Time   COLORURINE YELLOW (A) 07/22/2016 0748   APPEARANCEUR CLEAR (A) 07/22/2016 0748   APPEARANCEUR Clear 03/15/2016 0853   LABSPEC 1.009 07/22/2016 0748   PHURINE 6.0 07/22/2016 0748   GLUCOSEU NEGATIVE 07/22/2016 0748   HGBUR MODERATE (A) 07/22/2016 0748   BILIRUBINUR NEGATIVE 07/22/2016 0748   BILIRUBINUR Negative 03/15/2016 [place] 07/22/2016 0748   PROTEINUR 30 (A) 07/22/2016 0748   NITRITE NEGATIVE 07/22/2016 0748   LEUKOCYTESUR NEGATIVE 07/22/2016 0748   LEUKOCYTESUR Negative 03/15/2016 0853   Sepsis Labs Invalid input(s): PROCALCITONIN,  WBC,  LACTICIDVEN Microbiology Recent Results (from the past 240 hour(s))  Body fluid culture     Status: None   Collection Time: 11/06/16  2:37 PM  Result Value Ref Range Status   Specimen Description FLUID THORACENTESIS  Final   Special Requests NONE  Final   Gram Stain   Final    MODERATE WBC PRESENT, PREDOMINANTLY PMN NO ORGANISMS SEEN    Culture NO GROWTH 3 DAYS  Final   Report Status 11/10/2016 FINAL  Final  Culture, respiratory (NON-Expectorated)     Status: None (Preliminary result)   Collection Time: 11/08/16  9:13 AM  Result Value Ref Range Status   Specimen Description BRONCHIAL ALVEOLAR LAVAGE  Final   Special Requests NONE  Final   Gram Stain   Final    MODERATE WBC PRESENT, PREDOMINANTLY PMN NO ORGANISMS SEEN    Culture RARE STAPHYLOCOCCUS AUREUS  Final   Report Status PENDING  Incomplete   Time coordinating discharge: 35 minutes  SIGNED:  Irwin Brakeman, MD  [facility] 11/11/2016, 2:14 PM Pager [phone]  If 7PM-7AM, please contact night-coverage [email or web address] Password [email or web address]

## 2016-11-11 NOTE — Care Management Note (Signed)
Case Management Note  Patient Details  Name: Kevin Gutierrez MRN: [id number] Date of Birth: [birth year]  Subjective/Objective: Pt presented for Acute Respiratory Failure. Pt is from home with wife. On home 02- wife needed a portable 02 tank.                    Action/Plan: CM did call [facility] to get portable tank delivered. Pt has 02 via them. No further home needs identified.   Expected Discharge Date:                  Expected Discharge Plan:  Home/Self Care  In-House Referral:  NA  Discharge planning Services  CM Consult  Post Acute Care Choice:  Durable Medical Equipment Choice offered to:  Patient  DME Arranged:  Oxygen DME Agency:  [place]:  NA [place] Agency:  NA  Status of Service:  Completed, signed off  If discussed at [place] of Stay Meetings, dates discussed:    Additional Comments:  Bethena Roys, RN 11/11/2016, 12:54 PM

## 2016-11-11 NOTE — Progress Notes (Signed)
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  He has not had ongoing angina.  Objective:  Vital Signs in the last 24 hours: BP 132/72   Pulse 89   Temp 98.4 F (36.9 C) (Oral)   Resp 16   Ht 6' (1.829 m)   Wt 89 kg (196 lb 3.2 oz)   SpO2 94%   BMI 26.61 kg/m   Physical Exam: Bearded white male currently in no acute distress Lungs:  Clear Cardiac:  Regular rhythm, normal S1 and S2, no S3 Extremities:  No edema present  Intake/Output from previous day: 07/20 0701 - 07/21 0700 In: 765 [P.O.:715; IV Piggyback:50] Out: 1850 [Urine:1850]  Weight Filed Weights   11/10/16 0104 11/10/16 1520 11/11/16 0431  Weight: 92.2 kg (203 lb 4.2 oz) 91.5 kg (201 lb 12.8 oz) 89 kg (196 lb 3.2 oz)    Lab Results: Basic Metabolic Panel:  Recent Labs  11/09/16 1142 11/11/16 0553  NA 139 138  K 5.4* 3.3*  CL 99* 92*  CO2 31 38*  GLUCOSE 187* 207*  BUN 40* 27*  CREATININE 1.45* 1.28*   CBC:  Recent Labs  11/09/16 0612  WBC 12.0*  HGB 14.0  HCT 46.7  MCV 105.2*  PLT 155   Telemetry: Person reviewed.  Sinus rhythm  Assessment/Plan:  1.  Chronic systolic heart failure 2.  Three-vessel coronary artery disease but no angina 3.  Small cell lung cancer 4.  DO NOT RESUSCITATE  Recommendations:  In the absence of angina at this point not a candidate for coronary bypass grafting in light of his small cell lung cancer.  Would not pursue PCI unless severe symptoms.  May discharge from cardiovascular viewpoint when other problems are stable.  Diabetic control has not been good.     Kerry Hough  MD [facility]  11/11/2016, 9:52 AM

## 2016-11-12 ENCOUNTER — Encounter: Payer: Self-pay | Admitting: Family Medicine

## 2016-11-12 DIAGNOSIS — I200 Unstable angina: Secondary | ICD-10-CM | POA: Diagnosis not present

## 2016-11-12 LAB — CULTURE, RESPIRATORY W GRAM STAIN

## 2016-11-12 LAB — CULTURE, RESPIRATORY

## 2016-11-13 ENCOUNTER — Telehealth: Payer: Self-pay | Admitting: *Deleted

## 2016-11-13 NOTE — Telephone Encounter (Signed)
Transition Care Management Follow-up Telephone Call  How have you been since you were released from the hospital? Patient wife verbalized patient doing well.   Do you understand why you were in the hospital? Yes   Do you understand the discharge instrcutions? Yes  Items Reviewed:  Medications reviewed: yes  Allergies reviewed: yes  Dietary changes reviewed: yes, heart healthy low glycemic.  Referrals reviewed: yes   Functional Questionnaire:   Activities of Daily Living (ADLs):   He states they are independent in the following: Patient wife assists with baths and dressing. States they require assistance with the following: Patient is tiring easily and patient wife ask for order for wheelchair and bedside toilet and a shower chair.   Any transportation issues/concerns?: Yes   Any patient concerns? Yes as stated above need wheelchair to transport from house to car.  The 2 liter portable tank 02 is not lasting long enough going to [facility] for Cancer treatment the portable does not last until they return home. May need portable concentrator .   Confirmed importance and date/time of follow-up visits scheduled: Yes   Confirmed with patient if condition begins to worsen call PCP or go to the ER.  Patient was given the Call-a-Nurse line [phone]: Yes

## 2016-11-13 NOTE — Telephone Encounter (Signed)
Oncology Nurse Navigator Documentation  Oncology Nurse Navigator Flowsheets 11/13/2016  Navigator Location [facility]  Referral date to RadOnc/MedOnc 11/13/2016  Navigator Encounter Type Telephone/I followed up with Kevin Gutierrez on an appt to see Kevin Gutierrez, newly DX small cell lung cancer.  He would like to see patient on 11/15/16.  I called and updated on appt time and place.   Telephone Outgoing Call  Treatment Phase Pre-Tx/Tx Discussion  Barriers/Navigation Needs Coordination of Care  Interventions Coordination of Care  Coordination of Care Appts  Acuity Level 2  Acuity Level 2 Assistance expediting appointments  Time Spent with Patient 30

## 2016-11-13 NOTE — Telephone Encounter (Signed)
Transition Care Management Follow-up Telephone Call  How have you been since you were released from the hospital? Patient wife verbalized patient doing well.   Do you understand why you were in the hospital? Yes   Do you understand the discharge instrcutions? Yes  Items Reviewed:  Medications reviewed: yes  Allergies reviewed: yes  Dietary changes reviewed: yes, heart healthy low glycemic.  Referrals reviewed: yes   Functional Questionnaire:   Activities of Daily Living (ADLs):   He states they are independent in the following: Patient wife assists with baths and dressing. States they require assistance with the following: Patient is tiring easily and patient wife ask for order for wheelchair and bedside toilet and a shower chair.   Any transportation issues/concerns?: Yes   Any patient concerns? Yes as stated above need wheelchair to transport from house to car.  The 2 liter portable tank 02 is not lasting long enough going to [facility] for Cancer treatment the portable does not last until they return home. May need portable concentrator .   Confirmed importance and date/time of follow-up visits scheduled: Yes   Confirmed with patient if condition begins to worsen call PCP or go to the ER.  Patient was given the Call-a-Nurse line [phone]: Yes

## 2016-11-14 ENCOUNTER — Other Ambulatory Visit: Payer: Self-pay | Admitting: Internal Medicine

## 2016-11-14 ENCOUNTER — Other Ambulatory Visit: Payer: Self-pay | Admitting: *Deleted

## 2016-11-14 DIAGNOSIS — C3412 Malignant neoplasm of upper lobe, left bronchus or lung: Secondary | ICD-10-CM

## 2016-11-14 NOTE — Telephone Encounter (Signed)
Yes please

## 2016-11-14 NOTE — Telephone Encounter (Signed)
Please advise if we can refill

## 2016-11-15 ENCOUNTER — Other Ambulatory Visit (HOSPITAL_BASED_OUTPATIENT_CLINIC_OR_DEPARTMENT_OTHER): Payer: PPO

## 2016-11-15 ENCOUNTER — Ambulatory Visit (HOSPITAL_BASED_OUTPATIENT_CLINIC_OR_DEPARTMENT_OTHER): Payer: PPO | Admitting: Internal Medicine

## 2016-11-15 ENCOUNTER — Encounter: Payer: Self-pay | Admitting: Radiation Oncology

## 2016-11-15 ENCOUNTER — Encounter: Payer: Self-pay | Admitting: Internal Medicine

## 2016-11-15 VITALS — BP 133/76 | HR 72 | Temp 97.8°F | Resp 18 | Ht 72.0 in | Wt 189.8 lb

## 2016-11-15 DIAGNOSIS — J9610 Chronic respiratory failure, unspecified whether with hypoxia or hypercapnia: Secondary | ICD-10-CM

## 2016-11-15 DIAGNOSIS — F411 Generalized anxiety disorder: Secondary | ICD-10-CM

## 2016-11-15 DIAGNOSIS — Z720 Tobacco use: Secondary | ICD-10-CM

## 2016-11-15 DIAGNOSIS — C3412 Malignant neoplasm of upper lobe, left bronchus or lung: Secondary | ICD-10-CM

## 2016-11-15 DIAGNOSIS — Z5111 Encounter for antineoplastic chemotherapy: Secondary | ICD-10-CM | POA: Insufficient documentation

## 2016-11-15 DIAGNOSIS — Z7189 Other specified counseling: Secondary | ICD-10-CM | POA: Insufficient documentation

## 2016-11-15 LAB — CBC WITH DIFFERENTIAL/PLATELET
BASO%: 0.1 % (ref 0.0–2.0)
Basophils Absolute: 0 10*3/uL (ref 0.0–0.1)
EOS ABS: 0 10*3/uL (ref 0.0–0.5)
EOS%: 0.1 % (ref 0.0–7.0)
HEMATOCRIT: 44.7 % (ref 38.4–49.9)
HEMOGLOBIN: 14.4 g/dL (ref 13.0–17.1)
LYMPH#: 0.7 10*3/uL — AB (ref 0.9–3.3)
LYMPH%: 5.4 % — ABNORMAL LOW (ref 14.0–49.0)
MCH: 31.4 pg (ref 27.2–33.4)
MCHC: 32.2 g/dL (ref 32.0–36.0)
MCV: 97.6 fL (ref 79.3–98.0)
MONO#: 0.7 10*3/uL (ref 0.1–0.9)
MONO%: 5 % (ref 0.0–14.0)
NEUT%: 89.4 % — ABNORMAL HIGH (ref 39.0–75.0)
NEUTROS ABS: 12.1 10*3/uL — AB (ref 1.5–6.5)
PLATELETS: 148 10*3/uL (ref 140–400)
RBC: 4.58 10*6/uL (ref 4.20–5.82)
RDW: 15.6 % — AB (ref 11.0–14.6)
WBC: 13.5 10*3/uL — AB (ref 4.0–10.3)

## 2016-11-15 LAB — COMPREHENSIVE METABOLIC PANEL
ALBUMIN: 2.9 g/dL — AB (ref 3.5–5.0)
ALK PHOS: 146 U/L (ref 40–150)
ALT: 83 U/L — AB (ref 0–55)
ANION GAP: 14 meq/L — AB (ref 3–11)
AST: 73 U/L — ABNORMAL HIGH (ref 5–34)
BILIRUBIN TOTAL: 1.05 mg/dL (ref 0.20–1.20)
BUN: 29.4 mg/dL — ABNORMAL HIGH (ref 7.0–26.0)
CALCIUM: 9.1 mg/dL (ref 8.4–10.4)
CO2: 28 meq/L (ref 22–29)
CREATININE: 1.3 mg/dL (ref 0.7–1.3)
Chloride: 98 mEq/L (ref 98–109)
EGFR: 56 mL/min/{1.73_m2} — ABNORMAL LOW (ref >90)
Glucose: 264 mg/dl — ABNORMAL HIGH (ref 70–140)
Potassium: 4.4 mEq/L (ref 3.5–5.1)
Sodium: 139 mEq/L (ref 136–145)
TOTAL PROTEIN: 6.2 g/dL — AB (ref 6.4–8.3)

## 2016-11-15 MED ORDER — PROCHLORPERAZINE MALEATE 10 MG PO TABS: 10.0000 mg | ORAL_TABLET | Freq: Four times a day (QID) | ORAL | 0 refills | Status: AC | PRN

## 2016-11-15 NOTE — Progress Notes (Signed)
[place] Telephone:([phone]) [phone]   Fax:([phone]) [phone]  CONSULT NOTE  REFERRING PHYSICIAN: Dr. Baltazar Apo.   REASON FOR CONSULTATION:  66 years old white male recently diagnosed with lung cancer.  HPI Kevin Gutierrez is a 66 y.o. male was past medical history significant for COPD, hypertension, kidney stones, chronic kidney disease, cardiomyopathy with ejection fraction of 35-40%, peptic ulcer disease, vitamin D deficiency as well as long history of smoking. The patient has been complaining of worsening shortness of breath since May 2018. He had chest x-ray on 10/29/2016 and it showed interval development of extensive consolidation within the left mid and lower lung concerning for pneumonia. He was treated with a course of antibiotics and repeat chest x-ray on 11/05/2016 showed interval worsening of the left mid to lower lung field airspace disease with increase in the size of the left pleural effusion. Underlying hilar mass was not entirely excluded. The patient had CT scan of the chest without contrast on 11/05/2016 and it showed apparent abrupt termination of the left upper lobe bronchus with large area of consolidative changes in the left upper lobe and primarily lingula. The finding may represent postobstructive atelectasis versus pneumonia. There is fullness of the left hilar soft tissue concerning for hilar mass. There was also small left pleural effusion and left lower lobe consolidative changes. There is multiple mildly prominent mediastinal lymph nodes. The patient was seen by Dr. Lamonte Sakai. He underwent flexible video fiberoptic bronchoscopy with biopsies on 11/08/2016. The procedure showed a fungating quite/gray mass emanating from the lingular and left upper airways occluding almost all of the distal left upper lobe bronchus. The final pathology ([id number]) showed a small cell carcinoma. The immunohistochemical stains showed the strong positivity with TTF-1, CD 56 and  cytokeratin AE 1/AE3. The morphology and immunophenotyping by our most consistent with a small cell carcinoma. Dr. Lamonte Sakai kindly referred the patient to me today for evaluation and recommendation regarding treatment of his condition. When seen today the patient continues to complain of weakness in the lower extremity as well as cough productive of brownish sputum and shortness of breath at baseline and increased with exertion. He is currently on home oxygen. He has intermittent mild left sided chest pain. He has no weight loss or night sweats. He has no nausea, vomiting, diarrhea or constipation. He denied having any headache or visual changes. Family history significant for mother died from heart disease, father had stroke, sister had brain cancer and brother had non-small cell lung cancer The patient is married and has one son. He was accompanied today by his wife [name]. He used to work as a Water engineer but currently retired. He has a history of smoking 1.5 pack per day for around 50 years and quit in July 2018. He has no history of alcohol or drug abuse.  HPI  Past Medical History:  Diagnosis Date  . Chicken pox   . COPD (chronic obstructive pulmonary disease) ([place])   . History of kidney stones 05/17/2015  . History of stomach ulcers 05/17/2015  . Hypertension   . Kidney stones   . Nephrolithiasis   . Peptic ulcer disease   . Vitamin D deficiency     Past Surgical History:  Procedure Laterality Date  . LEFT HEART CATH AND CORONARY ANGIOGRAPHY N/A 11/07/2016   Procedure: Left Heart Cath and Coronary Angiography;  Surgeon: Troy Sine, MD;  Location: [facility];  Service: Cardiovascular;  Laterality: N/A;  . LITHOTRIPSY    .  SPINAL CORD DECOMPRESSION  03/08/1994  . VIDEO BRONCHOSCOPY N/A 11/08/2016   Procedure: VIDEO BRONCHOSCOPY;  Surgeon: Collene Gobble, MD;  Location: [facility];  Service: Cardiopulmonary;  Laterality: N/A;    Family History  Problem Relation Age of Onset  .  Diabetes Mellitus II Brother   . Lung cancer Brother   . Heart disease Brother        CABG in his 52's  . Hyperlipidemia Brother   . Heart disease Mother        MI in her 49's  . Stroke Father   . Hypertension Brother     Social History Social History  Substance Use Topics  . Smoking status: Current Every Day Smoker    Packs/day: 0.50    Years: 50.00    Types: Cigarettes  . Smokeless tobacco: Never Used     Comment: Currently smoking 4 cigarettes daily  . Alcohol use No    Allergies  Allergen Reactions  . No Known Allergies     Current Outpatient Prescriptions  Medication Sig Dispense Refill  . acetaminophen (TYLENOL) 325 MG tablet Take 500 mg by mouth every 6 (six) hours as needed for moderate pain, fever or headache.     . albuterol (PROVENTIL) (2.5 MG/3ML) 0.083% nebulizer solution Take 3 mLs (2.5 mg total) by nebulization every 3 (three) hours as needed for wheezing or shortness of breath. 75 mL 12  . albuterol (VENTOLIN HFA) 108 (90 Base) MCG/ACT inhaler Inhale 2 puffs into the lungs every 4 (four) hours as needed for wheezing or shortness of breath. 1 Inhaler 3  . aspirin EC 81 MG tablet Take 1 tablet (81 mg total) by mouth daily.    Marland Kitchen atorvastatin (LIPITOR) 20 MG tablet Take 1 tablet (20 mg total) by mouth daily. (Patient taking differently: Take 20 mg by mouth every other day. ) 90 tablet 3  . blood glucose meter kit and supplies Dispense based on patient and insurance preference. Use up to four times daily as directed. (FOR ICD-10 E11.9). 1 each 0  . clonazePAM (KLONOPIN) 0.5 MG tablet Take 1 tablet (0.5 mg total) by mouth 2 (two) times daily as needed for anxiety. 60 tablet 0  . dextromethorphan (DELSYM) 30 MG/5ML liquid Take 2.5 mLs (15 mg total) by mouth 2 (two) times daily. 89 mL 0  . doxycycline (VIBRAMYCIN) 100 MG capsule Take 1 capsule (100 mg total) by mouth 2 (two) times daily. 8 capsule 0  . fluticasone furoate-vilanterol (BREO ELLIPTA) 200-25 MCG/INH AEPB  Inhale 1 puff into the lungs daily. 1 each 0  . furosemide (LASIX) 40 MG tablet Take 1 tablet (40 mg total) by mouth daily. 14 tablet 0  . glipiZIDE (GLUCOTROL XL) 5 MG 24 hr tablet Take 1 tablet (5 mg total) by mouth daily with breakfast. 14 tablet 0  . HYDROcodone-acetaminophen (NORCO) 10-325 MG tablet Take 1 tablet by mouth every 6 (six) hours as needed for moderate pain.    . isosorbide-hydrALAZINE (BIDIL) 20-37.5 MG tablet Take 1 tablet by mouth 3 (three) times daily. 90 tablet 0  . metoprolol tartrate (LOPRESSOR) 25 MG tablet Take 1 tablet (25 mg total) by mouth 2 (two) times daily. 60 tablet 0  . nystatin (MYCOSTATIN) 100000 UNIT/ML suspension TAKE  5 ML BY MOUTH 4 TIMES DAILY 473 mL 0  . pantoprazole (PROTONIX) 40 MG tablet Take 1 tablet (40 mg total) by mouth daily. 90 tablet 0  . predniSONE (DELTASONE) 20 MG tablet Take 1 tablet (20 mg total)  by mouth daily with breakfast. 7 tablet 0  . tiotropium (SPIRIVA) 18 MCG inhalation capsule Place 1 capsule (18 mcg total) into inhaler and inhale daily. 30 capsule 5   No current facility-administered medications for this visit.     Review of Systems  Constitutional: positive for fatigue Eyes: negative Ears, nose, mouth, throat, and face: negative Respiratory: positive for cough, dyspnea on exertion and pleurisy/chest pain Cardiovascular: negative Gastrointestinal: negative Genitourinary:negative Integument/breast: negative Hematologic/lymphatic: negative Musculoskeletal:positive for muscle weakness Neurological: negative Behavioral/Psych: negative Endocrine: negative Allergic/Immunologic: negative  Physical Exam  TXH:FSFSE, healthy, no distress, well nourished, well developed and anxious SKIN: skin color, texture, turgor are normal, no rashes or significant lesions HEAD: Normocephalic, No masses, lesions, tenderness or abnormalities EYES: normal, PERRLA, Conjunctiva are pink and non-injected EARS: External ears normal, Canals  clear OROPHARYNX:no exudate, no erythema and lips, buccal mucosa, and tongue normal  NECK: supple, no adenopathy, no JVD LYMPH:  no palpable lymphadenopathy, no hepatosplenomegaly LUNGS: prolonged expiratory phase, expiratory wheezes bilaterally HEART: regular rate & rhythm, no murmurs and no gallops ABDOMEN:abdomen soft, non-tender, normal bowel sounds and no masses or organomegaly BACK: Back symmetric, no curvature., No CVA tenderness EXTREMITIES:no joint deformities, effusion, or inflammation, no edema, no skin discoloration  NEURO: alert & oriented x 3 with fluent speech, no focal motor/sensory deficits  PERFORMANCE STATUS: ECOG 1  LABORATORY DATA: Lab Results  Component Value Date   WBC 13.5 (H) 11/15/2016   HGB 14.4 11/15/2016   HCT 44.7 11/15/2016   MCV 97.6 11/15/2016   PLT 148 11/15/2016      Chemistry      Component Value Date/Time   NA 138 11/11/2016 0553   K 3.3 (L) 11/11/2016 0553   CL 92 (L) 11/11/2016 0553   CO2 38 (H) 11/11/2016 0553   BUN 27 (H) 11/11/2016 0553   CREATININE 1.28 (H) 11/11/2016 0553      Component Value Date/Time   CALCIUM 8.4 (L) 11/11/2016 0553   ALKPHOS 108 11/09/2016 0612   AST 35 11/09/2016 0612   ALT 39 11/09/2016 0612   BILITOT 1.2 11/09/2016 0612       RADIOGRAPHIC STUDIES: Dg Chest 2 View  Result Date: 11/05/2016 CLINICAL DATA:  66 year old male with shortness of breath. EXAM: CHEST  2 VIEW COMPARISON:  Chest radiograph dated 10/29/2016 FINDINGS: There has been interval worsening of the airspace disease primarily involving the left mid to lower lung field. Although findings may be infectious in etiology, a left hilar mass is not expanded excluded. Clinical correlation is recommended. CT of the chest with contrast may provide better evaluation. There is a small left pleural effusion. Stable cardiac silhouette. There is pectus excavatum deformity. No acute osseous pathology. IMPRESSION: Interval worsening of the left mid to lower  lung field airspace disease well increase in the size of the left pleural effusion. Findings may be infectious in etiology. However, underlying hilar mass is not entirely excluded. Clinical correlation is recommended. CT of the chest with contrast may provide better evaluation. Electronically Signed   By: Anner Crete M.D.   On: 11/05/2016 01:16   Dg Chest 2 View  Result Date: 10/29/2016 CLINICAL DATA:  Patient with history of COPD.  Shortness of breath. EXAM: CHEST  2 VIEW COMPARISON:  Chest radiograph 03/19/2016. FINDINGS: Stable cardiomegaly. Interval development of left perihilar and left lower lung consolidation. Small left pleural effusion. No pneumothorax. Thoracic spine degenerative changes. Pectus excavatum. IMPRESSION: Interval development of extensive consolidation within the left mid and lower lung concerning  for pneumonia in the appropriate clinical setting. Followup PA and lateral chest X-ray is recommended in 3-4 weeks following trial of antibiotic therapy to ensure resolution and exclude underlying malignancy. Small left pleural effusion. Electronically Signed   By: Lovey Newcomer M.D.   On: 10/29/2016 15:46   Ct Chest Wo Contrast  Result Date: 11/07/2016 CLINICAL DATA:  66 year old male with progressive dyspnea. Abnormal chest x-ray and CT. Subsequent encounter. EXAM: CT CHEST WITHOUT CONTRAST TECHNIQUE: Multidetector CT imaging of the chest was performed following the standard protocol without IV contrast. COMPARISON:  11/06/2016 chest x-ray. 11/05/2016 and 06/07/2015 chest CT. FINDINGS: Cardiovascular: Pectus deformity with cardiomegaly. Coronary artery calcifications. Atherosclerotic changes thoracic aorta. Ascending thoracic aorta measures up to 4 Cm. Mediastinum/Nodes: Mediastinal adenopathy. Lungs/Pleura: Obstruction left upper lobe bronchus with postobstructive atelectasis/ infiltrate. Rounded fullness left hilar region raises possibly of obstructing mass (not well delineated on  present noncontrast exam). The presence of adjacent mediastinal raises possibility of tumor. Atelectasis with bronchial thickening involving portions of the lower lobes bilaterally and medial aspect of the right middle lobe. Very small left-sided pleural effusion. Upper Abdomen: Slightly lobulated contour of the liver without other findings of cirrhosis. Mild nodularity left adrenal gland. Musculoskeletal: Sclerotic appearance left humeral head suggestive of avascular necrosis. Pectus deformity. Mild degenerative changes thoracic spine with destructive lesion. IMPRESSION: Obstruction left upper lobe bronchus with postobstructive atelectasis/ infiltrate. Rounded fullness left hilar region raises possibly of obstructing mass (not well delineated on present noncontrast exam). The presence of adjacent mediastinal raises possibility of tumor. Atelectasis with bronchial thickening involving portions of the lower lobes bilaterally and medial aspect of the right middle lobe. Very small left-sided pleural effusion. Mild nodularity left adrenal gland. Ascending thoracic aorta measures up to 4 cm. Recommend annual imaging followup by CTA or MRA. This recommendation follows 2010 ACCF/AHA/AATS/ACR/ASA/SCA/SCAI/SIR/STS/SVM Guidelines for the Diagnosis and Management of Patients with Thoracic Aortic Disease. Circulation. 2010; 121: Z366-Y403 Sclerotic appearance left humeral head suggestive of avascular necrosis. Pectus deformity with mild cardiomegaly. Aortic Atherosclerosis (ICD10-I70.0). Electronically Signed   By: Genia Del M.D.   On: 11/07/2016 07:18   Ct Chest Wo Contrast  Result Date: 11/05/2016 CLINICAL DATA:  66 year old male with possible left hilar mass. EXAM: CT CHEST WITHOUT CONTRAST TECHNIQUE: Multidetector CT imaging of the chest was performed following the standard protocol without IV contrast. COMPARISON:  Chest radiograph dated 11/05/2016 FINDINGS: Evaluation of this exam is limited in the absence of  intravenous contrast. Cardiovascular: There is mild cardiomegaly. No pericardial effusion. There is coronary vascular calcification primarily involving the LAD, left circumflex artery. There is mild atherosclerotic calcification of the thoracic aorta. Evaluation of the aorta and pulmonary arteries is limited in the absence of intravenous contrast. Mediastinum/Nodes: Multiple top-normal mediastinal lymph nodes noted. No definite adenopathy. Evaluation of the left hilum is limited due to consolidative changes of the adjacent lung. Esophagus is grossly unremarkable. No thyroid nodules noted. Lungs/Pleura: There is a small left pleural effusion. Left lower lobe consolidative changes may represent atelectasis versus infiltrate. There is consolidative changes of the left upper lobe and the lingula. There is abrupt termination of the left upper lobe bronchus. Findings may represent postobstructive atelectasis versus pneumonia. There is fullness of the left hilar soft tissue concerning for underlying mass. Evaluation of the hilum is limited on this noncontrast CT. Correlation with clinical exam and follow-up to resolution or further evaluation with CT with IV contrast recommended. There is background of emphysema. Right lung base atelectasis/scarring noted. Upper Abdomen: No acute abnormality.  Musculoskeletal: There is pectus deformity.  No acute fracture. IMPRESSION: 1. Apparent abrupt termination of the left upper lobe bronchus with large area consolidative changes in the left upper lobe and primarily lingula. Findings may represent postobstructive atelectasis versus pneumonia. There is fullness of the left hilar soft tissue concerning for a hilar mass. Evaluation is very limited on this noncontrast CT. Follow-up to resolution or further evaluation with CT with IV contrast recommended. 2. Small left pleural effusion and left lower lobe consolidative changes which may represent atelectasis versus infiltrate. 3. Mild  cardiomegaly. 4. Multiple mildly prominent mediastinal lymph nodes Electronically Signed   By: Anner Crete M.D.   On: 11/05/2016 02:25   Dg Chest Port 1 View  Result Date: 11/06/2016 CLINICAL DATA:  Left-sided thoracentesis EXAM: PORTABLE CHEST 1 VIEW COMPARISON:  Yesterday FINDINGS: No visible residual fluid. No visible pneumothorax. There is a known left hilar mass with extensive upper lobe opacification. Cardiomegaly. Mild atelectasis at the medial right base. IMPRESSION: No acute finding after thoracentesis. Known left upper lobe mass and collapse. Electronically Signed   By: Monte Fantasia M.D.   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   PLAN: I had a lengthy discussion with the patient and his wife today about his current disease stage, prognosis and treatment options. I personally and independently reviewed the scan images and discuss the results and showed the images to the patient and his wife. I recommended for the patient to complete the staging workup by ordering a PET scan as well as MRI of the brain to rule out metastatic disease. I discussed with the patient his treatment options and goals of care. The patient understands that he has a very aggressive disease and currently is limited stage unless there is metastatic disease seen on the PET scan or MRI of the brain. I discussed with him the treatment options including palliative care versus systemic chemotherapy with carboplatin for AUC of 5 on day 1 and etoposide 120 MG/M2 on days 1, 2 and 3 every 3 weeks. I discussed with the patient the adverse effect of this treatment including but not limited to alopecia, myelosuppression,  nausea and vomiting, peripheral neuropathy, liver or renal dysfunction. The patient would like to proceed with the systemic chemotherapy. He is expected to start the first cycle of this treatment on 11/28/2016. I will also refer the patient to radiation oncology for consideration of palliative or concurrent radiation if he has limited stage disease. I will also arrange for the patient to have a chemotherapy education class before the first dose of his treatment. I will call his pharmacy with prescription for Compazine 10 mg by mouth every 6 hours as needed for nausea. The patient would come back for follow-up visit one week after the first dose of his treatment for reevaluation and management of any adverse effect of his chemotherapy. For COPD, he will continue with his current prednisone treatment as well as inhalers as prescribed by Dr. Lamonte Sakai. The patient was advised to call immediately if he has any concerning symptoms in the interval. The patient voices understanding of current disease status and treatment options and is in agreement with the current care plan.  All questions were answered. The patient  knows to call the clinic with any problems, questions or concerns. We can certainly see the patient much sooner if necessary.  Thank you so much for allowing me to participate in the care of Kevin Gutierrez. I will continue to follow up the patient with you and assist in his care.  I spent 55 minutes counseling the patient face to face. The total time spent in the appointment was 80 minutes.  Disclaimer: This note was dictated with voice recognition software. Similar sounding words can inadvertently be transcribed and may not be corrected upon review.   Jalah Warmuth K. November 15, 2016, 1:42 PM

## 2016-11-15 NOTE — Progress Notes (Signed)
START ON PATHWAY REGIMEN - Small Cell Lung     A cycle is every 21 days:     Etoposide      Carboplatin   **Always confirm dose/schedule in your pharmacy ordering system**    Patient Characteristics: Limited Stage, First Line Stage Grouping: Limited AJCC T Category: T3 AJCC N Category: N2 AJCC M Category: M0 AJCC 8 Stage Grouping: IIIB Line of therapy: First Line Would you be surprised if this patient died  in the next year? I would NOT be surprised if this patient died in the next year Intent of Therapy: Curative Intent, Discussed with Patient

## 2016-11-15 NOTE — Patient Instructions (Signed)
Steps to Quit Smoking Smoking tobacco can be bad for your health. It can also affect almost every organ in your body. Smoking puts you and people around you at risk for many serious long-lasting (chronic) diseases. Quitting smoking is hard, but it is one of the best things that you can do for your health. It is never too late to quit. What are the benefits of quitting smoking? When you quit smoking, you lower your risk for getting serious diseases and conditions. They can include:  Lung cancer or lung disease.  Heart disease.  Stroke.  Heart attack.  Not being able to have children (infertility).  Weak bones (osteoporosis) and broken bones (fractures).  If you have coughing, wheezing, and shortness of breath, those symptoms may get better when you quit. You may also get sick less often. If you are pregnant, quitting smoking can help to lower your chances of having a baby of low birth weight. What can I do to help me quit smoking? Talk with your doctor about what can help you quit smoking. Some things you can do (strategies) include:  Quitting smoking totally, instead of slowly cutting back how much you smoke over a period of time.  Going to in-person counseling. You are more likely to quit if you go to many counseling sessions.  Using resources and support systems, such as: ? Online chats with a counselor. ? Phone quitlines. ? Printed self-help materials. ? Support groups or group counseling. ? Text messaging programs. ? Mobile phone apps or applications.  Taking medicines. Some of these medicines may have nicotine in them. If you are pregnant or breastfeeding, do not take any medicines to quit smoking unless your doctor says it is okay. Talk with your doctor about counseling or other things that can help you.  Talk with your doctor about using more than one strategy at the same time, such as taking medicines while you are also going to in-person counseling. This can help make  quitting easier. What things can I do to make it easier to quit? Quitting smoking might feel very hard at first, but there is a lot that you can do to make it easier. Take these steps:  Talk to your family and friends. Ask them to support and encourage you.  Call phone quitlines, reach out to support groups, or work with a counselor.  Ask people who smoke to not smoke around you.  Avoid places that make you want (trigger) to smoke, such as: ? Bars. ? Parties. ? Smoke-break areas at work.  Spend time with people who do not smoke.  Lower the stress in your life. Stress can make you want to smoke. Try these things to help your stress: ? Getting regular exercise. ? Deep-breathing exercises. ? Yoga. ? Meditating. ? Doing a body scan. To do this, close your eyes, focus on one area of your body at a time from head to toe, and notice which parts of your body are tense. Try to relax the muscles in those areas.  Download or buy apps on your mobile phone or tablet that can help you stick to your quit plan. There are many free apps, such as QuitGuide from the CDC (Centers for Disease Control and Prevention). You can find more support from smokefree.gov and other websites.  This information is not intended to replace advice given to you by your health care provider. Make sure you discuss any questions you have with your health care provider. Document Released: 02/04/2009 Document   Revised: 12/07/2015 Document Reviewed: 08/25/2014 Elsevier Interactive Patient Education  2018 [employer]vier Inc.  

## 2016-11-16 ENCOUNTER — Telehealth: Payer: Self-pay | Admitting: Family Medicine

## 2016-11-16 ENCOUNTER — Ambulatory Visit: Payer: PPO | Admitting: Family Medicine

## 2016-11-16 ENCOUNTER — Encounter: Payer: Self-pay | Admitting: *Deleted

## 2016-11-16 DIAGNOSIS — J449 Chronic obstructive pulmonary disease, unspecified: Secondary | ICD-10-CM | POA: Diagnosis not present

## 2016-11-16 DIAGNOSIS — R600 Localized edema: Secondary | ICD-10-CM | POA: Diagnosis not present

## 2016-11-16 DIAGNOSIS — J9611 Chronic respiratory failure with hypoxia: Secondary | ICD-10-CM | POA: Diagnosis not present

## 2016-11-16 DIAGNOSIS — R0602 Shortness of breath: Secondary | ICD-10-CM | POA: Diagnosis not present

## 2016-11-16 DIAGNOSIS — R0609 Other forms of dyspnea: Principal | ICD-10-CM

## 2016-11-16 NOTE — Progress Notes (Signed)
Oncology Nurse Navigator Documentation  Oncology Nurse Navigator Flowsheets 11/16/2016  Navigator Location [facility]  Navigator Encounter Type Other/I contacted authorization coordinator for patient newly ordered scans.   Treatment Phase Pre-Tx/Tx Discussion  Barriers/Navigation Needs Coordination of Care  Interventions Coordination of Care  Coordination of Care Other  Acuity Level 1  Time Spent with Patient 15

## 2016-11-16 NOTE — Telephone Encounter (Signed)
Patient needs DME order for wheelchair, patient HFU was canceled due to PCP out of office. I have pended the DME order.

## 2016-11-16 NOTE — Telephone Encounter (Signed)
Dme place up front for patient daughter to pick up and take to advance home care location on [facility].

## 2016-11-16 NOTE — Telephone Encounter (Signed)
Reviewed reasons for wheelchair. DME order signed.

## 2016-11-16 NOTE — Telephone Encounter (Signed)
Kevin Gutierrez from [facility] called and stated that pt came in to get the wheelchair but pt was unable to propel it. They wanted to know if we can put in an order for a transport wheelchair. Any questions call Jason @ [place].

## 2016-11-16 NOTE — Telephone Encounter (Signed)
Order signed by Dr. Caryl Bis for dr. Lacinda Axon and faxed back to [facility].

## 2016-11-17 ENCOUNTER — Telehealth: Payer: Self-pay | Admitting: Family Medicine

## 2016-11-17 ENCOUNTER — Telehealth: Payer: Self-pay | Admitting: Internal Medicine

## 2016-11-17 ENCOUNTER — Telehealth: Payer: Self-pay | Admitting: *Deleted

## 2016-11-17 ENCOUNTER — Encounter: Payer: Self-pay | Admitting: *Deleted

## 2016-11-17 NOTE — Telephone Encounter (Signed)
Oncology Nurse Navigator Documentation  Oncology Nurse Navigator Flowsheets 11/17/2016  Navigator Location [facility]  Navigator Encounter Type Telephone/Central scheduling called me.  They gave me an appt for patient's MRI Brain and PET scan.  I called patient to update on scan and pre-procedure instructions.    Telephone Outgoing Call  Treatment Phase Pre-Tx/Tx Discussion  Barriers/Navigation Needs Coordination of Care;Education  Education Other  Interventions Coordination of Care;Education  Coordination of Care Appts  Education Method Verbal;Written  Acuity Level 2  Acuity Level 2 Educational needs;Assistance expediting appointments  Time Spent with Patient 56

## 2016-11-17 NOTE — Telephone Encounter (Signed)
Pt spouse called and stated that pt's blood sugar was 377 this morning at 7:30 and that was before taking glipizide. Pt had 24 carbs at breakfast and took a nap. After the nap his blood sugar was 448. Pt's other vitals are good, he is c/o feeling parched and weak. Please advise, thank you!

## 2016-11-17 NOTE — Progress Notes (Signed)
Oncology Nurse Navigator Documentation  Oncology Nurse Navigator Flowsheets 11/17/2016  Navigator Location [facility]  Navigator Encounter Type Other/I received notification that scans are authorized. I called central scheduling and left vm message for them to call me. I also left my name and phone number to call.   Treatment Phase Pre-Tx/Tx Discussion  Barriers/Navigation Needs Coordination of Care  Interventions Coordination of Care  Coordination of Care Other  Acuity Level 1  Time Spent with Patient 15

## 2016-11-17 NOTE — Telephone Encounter (Signed)
If he is symptomatic, recommend going to ER. Otherwise, we will address at follow up.

## 2016-11-17 NOTE — Telephone Encounter (Signed)
Spoke with the wife, tina.  Patient per the notes had increases in Blood glucose levels after eating, advised that if he was symptomatic that he needs to bee seen in the ED to assist.  She is going to monitor his levels today, with him being on Prednisone she knows it will rise, but it has gone down prior so, plans to check it prior to lunch and then an hour after eating.  If is continues to rise or he feels worse she will take him to the ED, if not she will bring him to his follow up appt. Thanks Vitals have bee BP 110/68's HR 60-70;s and O2 at 92% with 2.5L.

## 2016-11-17 NOTE — Telephone Encounter (Signed)
Please advise, thanks.

## 2016-11-17 NOTE — Telephone Encounter (Signed)
Left message re 8/3 and 8/7. Schedule of 8/3, 8/7 and remaining appointments mailed.

## 2016-11-17 NOTE — Telephone Encounter (Signed)
Call Tina @ [phone]

## 2016-11-20 ENCOUNTER — Ambulatory Visit (INDEPENDENT_AMBULATORY_CARE_PROVIDER_SITE_OTHER): Payer: PPO | Admitting: Family Medicine

## 2016-11-20 ENCOUNTER — Encounter: Payer: Self-pay | Admitting: Family Medicine

## 2016-11-20 ENCOUNTER — Telehealth: Payer: Self-pay | Admitting: Medical Oncology

## 2016-11-20 VITALS — BP 110/90 | HR 71 | Temp 98.2°F | Wt 194.5 lb

## 2016-11-20 DIAGNOSIS — C3412 Malignant neoplasm of upper lobe, left bronchus or lung: Secondary | ICD-10-CM

## 2016-11-20 DIAGNOSIS — E1159 Type 2 diabetes mellitus with other circulatory complications: Secondary | ICD-10-CM | POA: Diagnosis not present

## 2016-11-20 DIAGNOSIS — J9610 Chronic respiratory failure, unspecified whether with hypoxia or hypercapnia: Secondary | ICD-10-CM

## 2016-11-20 DIAGNOSIS — I5022 Chronic systolic (congestive) heart failure: Secondary | ICD-10-CM | POA: Diagnosis not present

## 2016-11-20 MED ORDER — FLUTICASONE-UMECLIDIN-VILANT 100-62.5-25 MCG/INH IN AEPB: 1.0000 | INHALATION_SPRAY | Freq: Every day | RESPIRATORY_TRACT | 3 refills | Status: DC

## 2016-11-20 MED ORDER — GLIPIZIDE ER 5 MG PO TB24: 5.0000 mg | ORAL_TABLET | Freq: Every day | ORAL | 1 refills | Status: AC

## 2016-11-20 MED ORDER — HYDROCODONE-ACETAMINOPHEN 10-325 MG PO TABS: 1.0000 | ORAL_TABLET | Freq: Three times a day (TID) | ORAL | 0 refills | Status: DC | PRN

## 2016-11-20 MED ORDER — FUROSEMIDE 40 MG PO TABS: 40.0000 mg | ORAL_TABLET | Freq: Every day | ORAL | 1 refills | Status: DC

## 2016-11-20 NOTE — Assessment & Plan Note (Addendum)
New diagnosis.  Medications reviewed and reconciled today. Has seen Oncology. Awaiting staging MRI/PET scans.  Starting Chemo soon. 5 day Rx given for pain medication (acutely per [place] Stop Act). Database reviewed. No issues/concerns.

## 2016-11-20 NOTE — Patient Instructions (Signed)
Basaglar - 5 units daily. Increase by 1 unit daily until fastings are below 150.  Pain medication as needed.  I will call about the oxygen.  Follow up in 2 weeks.  We will have you see Cardiology.  Take care  Dr. Lacinda Axon

## 2016-11-20 NOTE — Assessment & Plan Note (Signed)
Volume up today. Continue Lasix. Discussing case with cardiology.

## 2016-11-20 NOTE — Assessment & Plan Note (Signed)
Sugars uncontrolled. Starting on Basaglar 5 units daily with instructions to titrate up. Continue Sulfonylurea.

## 2016-11-20 NOTE — Telephone Encounter (Addendum)
8/8 infusion appt  interferes with scans. Schedule request sent to move infusion to later in afternoon. Wife notified to expect a call with new time.

## 2016-11-20 NOTE — Progress Notes (Signed)
Subjective:  Patient ID: Kevin Gutierrez, male    DOB: Jun 17, 1950  Age: 66 y.o. MRN: [id number]  CC: Hospital follow up  HPI:  66 year old male with COPD, chronic respiratory failure on O2, CHF, DM 2, CKD stage III, anxiety, hypertension, tobacco abuse, hyperlipidemia presents for hospital follow-up.  Patient recently admitted from 7/15 to 7/21. Hospital course reviewed and summarized as follows:  Patient recently seen in the ED and found to have community-acquired pneumonia. I subsequently started him on antibiotics as he left the emergency department without being seen.  He subsequently worsened and presented to the hospital with worsening shortness of breath.  Chest x-ray revealed findings concerning for mass. CT suggested the mass as well.  Patient was treated with antibiotics given recent pneumonia and findings on x-ray/CT. Bronchoscopy was done and revealed a subtotally obstructing mass highly suspicious for cancer. Pathology revealed small cell lung cancer.  Additionally, I recently discovered congestive heart failure via echocardiogram. He was scheduled to see cardiology but was hospitalized prior to seeing him. He was seen by cardiology during his hospital stay. He was found to have multivessel coronary artery disease. No intervention was planned given ongoing cancer as well as his comorbidities.  Patient was diuresed during hospitalization was discharged on Lasix.  Patient resents today for follow-up. He has seen oncology in the outpatient setting. He has staging MRI and PET scan coming up. He will be starting chemotherapy soon. Patient and his wife are requesting a shower chair and a bedside commode given his current status. Needs refill on Lasix. He needs to see cardiology. He is currently having significant pain, particularly from issues with renal colic. He also feels like he's having cancer related pain. Requesting pain medication today. Additionally, his blood sugars have been  markedly elevated. He was treated with steroids recently and his blood sugars have been quite high. He endorses compliance with his medication. Most recent sugars have been greater than 300.  Social Hx   Social History   Social History  . Marital status: Married    Spouse name: N/A  . Number of children: N/A  . Years of education: N/A   Occupational History  . retired    Social History Main Topics  . Smoking status: Current Every Day Smoker    Packs/day: 0.50    Years: 50.00    Types: Cigarettes  . Smokeless tobacco: Never Used     Comment: Currently smoking 4 cigarettes daily  . Alcohol use No  . Drug use: Yes    Types: Marijuana     Comment: Occasional marijuana use  . Sexual activity: Yes   Other Topics Concern  . None   Social History Narrative   Lives with family   Review of Systems  Constitutional: Positive for fatigue.  Respiratory: Positive for shortness of breath.   Cardiovascular: Positive for leg swelling.  Musculoskeletal:       Back pain/flank pain.   Objective:  BP 110/90 (BP Location: Left Arm, Patient Position: Sitting, Cuff Size: Normal)   Pulse 71   Temp 98.2 F (36.8 C) (Oral)   Wt 194 lb 8 oz (88.2 kg)   SpO2 93% Comment: 2 Liters O2  BMI 26.38 kg/m   BP/Weight 11/20/2016 11/15/2016 6/96/7893  Systolic BP 810 175 97  Diastolic BP 90 76 63  Wt. (Lbs) 194.5 189.8 196.2  BMI 26.38 25.74 -   Physical Exam  Constitutional: He is oriented to person, place, and time.  Chronically ill-appearing male, sitting  in wheelchair, nasal cannula oxygen in place.  HENT:  Head: Normocephalic and atraumatic.  Eyes: Conjunctivae are normal.  Cardiovascular: Normal rate and regular rhythm.   3+ pitting LE edema.   Pulmonary/Chest: Effort normal. He has no wheezes. He has no rales.  Neurological: He is alert and oriented to person, place, and time.  Psychiatric:  Flat affect.   Vitals reviewed.  Lab Results  Component Value Date   WBC 13.5 (H)  11/15/2016   HGB 14.4 11/15/2016   HCT 44.7 11/15/2016   PLT 148 11/15/2016   GLUCOSE 264 (H) 11/15/2016   CHOL 122 11/06/2016   TRIG 48.0 11/15/2015   HDL 37.40 (L) 11/15/2015   LDLCALC 52 11/15/2015   ALT 83 (H) 11/15/2016   AST 73 (H) 11/15/2016   NA 139 11/15/2016   K 4.4 Sl Hemolysis 11/15/2016   CL 92 (L) 11/11/2016   CREATININE 1.3 11/15/2016   BUN 29.4 (H) 11/15/2016   CO2 28 11/15/2016   PSA 0.71 05/17/2015   INR 0.99 05/04/2015   HGBA1C 7.1 (H) 11/07/2016    Assessment & Plan:   Problem List Items Addressed This Visit      Cardiovascular and Mediastinum   Chronic CHF (congestive heart failure) ([place])    Volume up today. Continue Lasix. Discussing case with cardiology.       Relevant Medications   furosemide (LASIX) 40 MG tablet     Respiratory   Chronic respiratory failure ([place])   Relevant Orders   DME Bedside commode   For home use only DME Shower stool   Small cell lung cancer, left upper lobe ([place]) - Primary    New diagnosis.  Medications reviewed and reconciled today. Has seen Oncology. Awaiting staging MRI/PET scans.  Starting Chemo soon. 5 day Rx given for pain medication (acutely per [place] Stop Act). Database reviewed. No issues/concerns.      Relevant Orders   DME Bedside commode   For home use only DME Shower stool     Endocrine   Type 2 diabetes mellitus ([place])    Sugars uncontrolled. Starting on Basaglar 5 units daily with instructions to titrate up. Continue Sulfonylurea.       Relevant Medications   glipiZIDE (GLUCOTROL XL) 5 MG 24 hr tablet      Meds ordered this encounter  Medications  . furosemide (LASIX) 40 MG tablet    Sig: Take 1 tablet (40 mg total) by mouth daily.    Dispense:  30 tablet    Refill:  1  . HYDROcodone-acetaminophen (NORCO) 10-325 MG tablet    Sig: Take 1 tablet by mouth every 8 (eight) hours as needed.    Dispense:  15 tablet    Refill:  0  . glipiZIDE (GLUCOTROL XL) 5 MG 24 hr tablet    Sig: Take 1  tablet (5 mg total) by mouth daily with breakfast.    Dispense:  90 tablet    Refill:  1  . Fluticasone-Umeclidin-Vilant (TRELEGY ELLIPTA) 100-62.5-25 MCG/INH AEPB    Sig: Inhale 1 Dose into the lungs daily.    Dispense:  60 each    Refill:  3     Follow-up: Return in about 2 weeks (around 12/04/2016).  [place]

## 2016-11-21 ENCOUNTER — Encounter: Payer: Self-pay | Admitting: Family Medicine

## 2016-11-21 ENCOUNTER — Other Ambulatory Visit: Payer: Self-pay | Admitting: Family Medicine

## 2016-11-21 DIAGNOSIS — C3412 Malignant neoplasm of upper lobe, left bronchus or lung: Secondary | ICD-10-CM | POA: Diagnosis not present

## 2016-11-21 DIAGNOSIS — J449 Chronic obstructive pulmonary disease, unspecified: Secondary | ICD-10-CM | POA: Diagnosis not present

## 2016-11-21 DIAGNOSIS — J9610 Chronic respiratory failure, unspecified whether with hypoxia or hypercapnia: Secondary | ICD-10-CM | POA: Diagnosis not present

## 2016-11-21 DIAGNOSIS — I5022 Chronic systolic (congestive) heart failure: Secondary | ICD-10-CM | POA: Diagnosis not present

## 2016-11-21 DIAGNOSIS — J9611 Chronic respiratory failure with hypoxia: Secondary | ICD-10-CM | POA: Diagnosis not present

## 2016-11-21 DIAGNOSIS — J439 Emphysema, unspecified: Secondary | ICD-10-CM | POA: Diagnosis not present

## 2016-11-21 DIAGNOSIS — R0602 Shortness of breath: Secondary | ICD-10-CM | POA: Diagnosis not present

## 2016-11-21 DIAGNOSIS — R600 Localized edema: Secondary | ICD-10-CM | POA: Diagnosis not present

## 2016-11-22 ENCOUNTER — Other Ambulatory Visit: Payer: Self-pay | Admitting: Family Medicine

## 2016-11-22 ENCOUNTER — Encounter: Payer: Self-pay | Admitting: *Deleted

## 2016-11-22 DIAGNOSIS — J439 Emphysema, unspecified: Secondary | ICD-10-CM | POA: Diagnosis not present

## 2016-11-22 DIAGNOSIS — J449 Chronic obstructive pulmonary disease, unspecified: Secondary | ICD-10-CM | POA: Diagnosis not present

## 2016-11-22 DIAGNOSIS — J9611 Chronic respiratory failure with hypoxia: Secondary | ICD-10-CM

## 2016-11-23 ENCOUNTER — Telehealth: Payer: Self-pay | Admitting: Internal Medicine

## 2016-11-23 ENCOUNTER — Telehealth: Payer: Self-pay | Admitting: Cardiovascular Disease

## 2016-11-23 NOTE — Telephone Encounter (Signed)
lmov to see patient sooner 11/23/16 [facility]

## 2016-11-23 NOTE — Telephone Encounter (Signed)
R/s appt per sch message from 7/31 - patients wife is aware of appt time change

## 2016-11-24 ENCOUNTER — Other Ambulatory Visit: Payer: PPO

## 2016-11-24 ENCOUNTER — Encounter: Payer: Self-pay | Admitting: *Deleted

## 2016-11-26 ENCOUNTER — Encounter: Payer: Self-pay | Admitting: Family Medicine

## 2016-11-27 ENCOUNTER — Encounter: Payer: Self-pay | Admitting: Family Medicine

## 2016-11-28 ENCOUNTER — Other Ambulatory Visit: Payer: Self-pay | Admitting: Internal Medicine

## 2016-11-28 ENCOUNTER — Encounter (INDEPENDENT_AMBULATORY_CARE_PROVIDER_SITE_OTHER): Payer: PPO

## 2016-11-28 ENCOUNTER — Encounter (INDEPENDENT_AMBULATORY_CARE_PROVIDER_SITE_OTHER): Payer: Self-pay

## 2016-11-28 ENCOUNTER — Ambulatory Visit (INDEPENDENT_AMBULATORY_CARE_PROVIDER_SITE_OTHER): Payer: Self-pay | Admitting: Vascular Surgery

## 2016-11-28 ENCOUNTER — Ambulatory Visit: Payer: PPO

## 2016-11-28 ENCOUNTER — Other Ambulatory Visit (HOSPITAL_BASED_OUTPATIENT_CLINIC_OR_DEPARTMENT_OTHER): Payer: PPO

## 2016-11-28 ENCOUNTER — Other Ambulatory Visit: Payer: Self-pay | Admitting: Medical Oncology

## 2016-11-28 ENCOUNTER — Encounter: Payer: Self-pay | Admitting: Radiation Oncology

## 2016-11-28 ENCOUNTER — Ambulatory Visit (HOSPITAL_BASED_OUTPATIENT_CLINIC_OR_DEPARTMENT_OTHER): Payer: PPO

## 2016-11-28 VITALS — BP 162/85 | HR 77 | Temp 98.0°F | Resp 18

## 2016-11-28 DIAGNOSIS — Z5111 Encounter for antineoplastic chemotherapy: Secondary | ICD-10-CM | POA: Diagnosis not present

## 2016-11-28 DIAGNOSIS — C3412 Malignant neoplasm of upper lobe, left bronchus or lung: Secondary | ICD-10-CM | POA: Diagnosis not present

## 2016-11-28 DIAGNOSIS — I878 Other specified disorders of veins: Secondary | ICD-10-CM

## 2016-11-28 LAB — CBC WITH DIFFERENTIAL/PLATELET
BASO%: 0.4 % (ref 0.0–2.0)
BASOS ABS: 0 10*3/uL (ref 0.0–0.1)
EOS ABS: 0 10*3/uL (ref 0.0–0.5)
EOS%: 0.3 % (ref 0.0–7.0)
HEMATOCRIT: 43.4 % (ref 38.4–49.9)
HGB: 14.2 g/dL (ref 13.0–17.1)
LYMPH#: 0.5 10*3/uL — AB (ref 0.9–3.3)
LYMPH%: 4.7 % — AB (ref 14.0–49.0)
MCH: 30.9 pg (ref 27.2–33.4)
MCHC: 32.7 g/dL (ref 32.0–36.0)
MCV: 94.5 fL (ref 79.3–98.0)
MONO#: 0.7 10*3/uL (ref 0.1–0.9)
MONO%: 6.5 % (ref 0.0–14.0)
NEUT#: 10 10*3/uL — ABNORMAL HIGH (ref 1.5–6.5)
NEUT%: 88.1 % — AB (ref 39.0–75.0)
PLATELETS: 98 10*3/uL — AB (ref 140–400)
RBC: 4.6 10*6/uL (ref 4.20–5.82)
RDW: 16.7 % — ABNORMAL HIGH (ref 11.0–14.6)
WBC: 11.3 10*3/uL — ABNORMAL HIGH (ref 4.0–10.3)

## 2016-11-28 LAB — COMPREHENSIVE METABOLIC PANEL
ALT: 168 U/L — AB (ref 0–55)
ANION GAP: 13 meq/L — AB (ref 3–11)
AST: 132 U/L — ABNORMAL HIGH (ref 5–34)
Albumin: 2.5 g/dL — ABNORMAL LOW (ref 3.5–5.0)
Alkaline Phosphatase: 270 U/L — ABNORMAL HIGH (ref 40–150)
BILIRUBIN TOTAL: 3.6 mg/dL — AB (ref 0.20–1.20)
BUN: 24.6 mg/dL (ref 7.0–26.0)
CALCIUM: 9.2 mg/dL (ref 8.4–10.4)
CHLORIDE: 95 meq/L — AB (ref 98–109)
CO2: 28 meq/L (ref 22–29)
CREATININE: 1.3 mg/dL (ref 0.7–1.3)
EGFR: 55 mL/min/{1.73_m2} — AB (ref >90)
Glucose: 415 mg/dl — ABNORMAL HIGH (ref 70–140)
Potassium: 3.8 mEq/L (ref 3.5–5.1)
Sodium: 137 mEq/L (ref 136–145)
Total Protein: 6.1 g/dL — ABNORMAL LOW (ref 6.4–8.3)

## 2016-11-28 MED ORDER — SODIUM CHLORIDE 0.9 % IV SOLN
461.0000 mg | Freq: Once | INTRAVENOUS | Status: AC
  Administered 2016-11-28: 460 mg via INTRAVENOUS
  Filled 2016-11-28: qty 46

## 2016-11-28 MED ORDER — PALONOSETRON HCL INJECTION 0.25 MG/5ML
INTRAVENOUS | Status: AC
  Filled 2016-11-28: qty 5

## 2016-11-28 MED ORDER — DEXAMETHASONE SODIUM PHOSPHATE 10 MG/ML IJ SOLN
INTRAMUSCULAR | Status: AC
  Filled 2016-11-28: qty 1

## 2016-11-28 MED ORDER — DEXAMETHASONE SODIUM PHOSPHATE 10 MG/ML IJ SOLN
10.0000 mg | Freq: Once | INTRAMUSCULAR | Status: AC
  Administered 2016-11-28: 10 mg via INTRAVENOUS

## 2016-11-28 MED ORDER — PALONOSETRON HCL INJECTION 0.25 MG/5ML
0.2500 mg | Freq: Once | INTRAVENOUS | Status: AC
Start: 2016-11-28 — End: 2016-11-28
  Administered 2016-11-28: 0.25 mg via INTRAVENOUS

## 2016-11-28 MED ORDER — SODIUM CHLORIDE 0.9 % IV SOLN
75.0000 mg/m2 | Freq: Once | INTRAVENOUS | Status: AC
  Administered 2016-11-28: 160 mg via INTRAVENOUS
  Filled 2016-11-28: qty 8

## 2016-11-28 MED ORDER — SODIUM CHLORIDE 0.9 % IV SOLN
Freq: Once | INTRAVENOUS | Status: AC
  Administered 2016-11-28: 09:00:00 via INTRAVENOUS

## 2016-11-28 NOTE — Progress Notes (Signed)
Pt and wife instructed to continue lasix as ordered and voiced understanding.

## 2016-11-28 NOTE — Progress Notes (Signed)
Bilirubin 3.6, AST 132, ALT, 168, platelets 98. Pt reports taking Norco (1 to 2 pills a day through the weekend and 1.5 pills today. Per Dr. Julien Nordmann okay to proceed with treatment with reduced dose of etoposide. No further orders at this time. Pt tolerated infusion well. Pt educated on s/s to watch for abnormal bleeding. Pt and spouse educated verbally and given printed discharge instructions. Pt and spouse verbalizes understanding. Pt stable at discharge.

## 2016-11-28 NOTE — Progress Notes (Signed)
Nutrition Assessment   Reason for Assessment:   Referral from RN as patient with questions for RD  ASSESSMENT:  66 year old male with new diagnosis of of lung cancer.  Past medical history of COPD, HTN, kidney stones, CKD, cardiomyopathy with ejection fraction of 35-40%, PUD, Vitamin D Deficiency, smoking.   Patient receiving first dose of chemotherapy today carboplatin and etoposide.  Planning consultation with radiation as well.  Met with patient and wife during infusion.  Patient reports good, normal appetite and stable weight.  Patient wanting to know what he can eat.  Reports eats a wide variety of foods.  Likes biscuit sometimes for breakfast or oatmeal, for lunch eats tomato sandwich or banana and mayo sandwich and dinner is usually meat and vegetables.  Wife reports he does drink sweet tea but she has cut back on amount of sugar added to beverages.  Also drinking a protein shake (equate brand) but can't remember the calorie amount.    No nausea, vomiting or issues with bowels per report today.  Nutrition Focused Physical Exam: deferred  Medications: lasix, glipizide, nystatin, protonix, compazine  Labs: glucose 415 (steriods given)  Anthropometrics:   Height: 72 inches Weight: 194 lb 8 oz UBW: 185-192 lb per patient report BMI: 26  Stable weight per patient  Estimated Energy Needs  Kcals: 2200-2640 calories/d Protein: 110-132 g/d Fluid: 2.6 L/d  NUTRITION DIAGNOSIS: Food and nutrition related knowledge deficit related to cancer and cancer treatments, DM, CHF as evidenced by patient and wife with question regarding healthy diet.   MALNUTRITION DIAGNOSIS: continue to monitor   INTERVENTION:   Encouraged patient to be mindful of sodium and concentrated sweets with DM and CHF especially if appetite is good/normal and weight is stable.   Encouraged patient to monitor blood glucose regularly at home and monitor swelling for CHF.  Encouraged patient to weigh and  follow-up with cardiologist with increase is weight.  Encouraged patient to alert MD if blood glucose elevated so adjustments in medication can be made.   If appetite decreases and weight decreases would recommend patient liberalize diet however this is not the case at this time.   Discussed oral nutrition supplements lower in carbohydrate and coupons given.      MONITORING, EVALUATION, GOAL: Patient will consume adequate calories and protein to maintain nutrition and weight during treatment   NEXT VISIT: August 28 during infusion  Lileigh Fahringer B. Zenia Resides, [place], [place] Registered Dietitian [phone] (pager)

## 2016-11-28 NOTE — Patient Instructions (Signed)
[facility] Discharge Instructions for Patients Receiving Chemotherapy  Today you received the following chemotherapy agents: Carboplatin, Etoposide   To help prevent nausea and vomiting after your treatment, we encourage you to take your nausea medication as directed.    If you develop nausea and vomiting that is not controlled by your nausea medication, call the clinic.   BELOW ARE SYMPTOMS THAT SHOULD BE REPORTED IMMEDIATELY:  *FEVER GREATER THAN 100.5 F  *CHILLS WITH OR WITHOUT FEVER  NAUSEA AND VOMITING THAT IS NOT CONTROLLED WITH YOUR NAUSEA MEDICATION  *UNUSUAL SHORTNESS OF BREATH  *UNUSUAL BRUISING OR BLEEDING  TENDERNESS IN MOUTH AND THROAT WITH OR WITHOUT PRESENCE OF ULCERS  *URINARY PROBLEMS  *BOWEL PROBLEMS  UNUSUAL RASH Items with * indicate a potential emergency and should be followed up as soon as possible.  Feel free to call the clinic you have any questions or concerns. The clinic phone number is [phone].  Please show the [facility] at check-in to the Emergency Department and triage nurse.    Carboplatin injection What is this medicine? CARBOPLATIN (KAR boe pla tin) is a chemotherapy drug. It targets fast dividing cells, like cancer cells, and causes these cells to die. This medicine is used to treat ovarian cancer and many other cancers. This medicine may be used for other purposes; ask your health care provider or pharmacist if you have questions. COMMON BRAND NAME(S): Paraplatin What should I tell my health care provider before I take this medicine? They need to know if you have any of these conditions: -blood disorders -hearing problems -kidney disease -recent or ongoing radiation therapy -an unusual or allergic reaction to carboplatin, cisplatin, other chemotherapy, other medicines, foods, dyes, or preservatives -pregnant or trying to get pregnant -breast-feeding How should I use this medicine? This drug is usually  given as an infusion into a vein. It is administered in a hospital or clinic by a specially trained health care professional. Talk to your pediatrician regarding the use of this medicine in children. Special care may be needed. Overdosage: If you think you have taken too much of this medicine contact a poison control center or emergency room at once. NOTE: This medicine is only for you. Do not share this medicine with others. What if I miss a dose? It is important not to miss a dose. Call your doctor or health care professional if you are unable to keep an appointment. What may interact with this medicine? -medicines for seizures -medicines to increase blood counts like filgrastim, pegfilgrastim, sargramostim -some antibiotics like amikacin, gentamicin, neomycin, streptomycin, tobramycin -vaccines Talk to your doctor or health care professional before taking any of these medicines: -acetaminophen -aspirin -ibuprofen -ketoprofen -naproxen This list may not describe all possible interactions. Give your health care provider a list of all the medicines, herbs, non-prescription drugs, or dietary supplements you use. Also tell them if you smoke, drink alcohol, or use illegal drugs. Some items may interact with your medicine. What should I watch for while using this medicine? Your condition will be monitored carefully while you are receiving this medicine. You will need important blood work done while you are taking this medicine. This drug may make you feel generally unwell. This is not uncommon, as chemotherapy can affect healthy cells as well as cancer cells. Report any side effects. Continue your course of treatment even though you feel ill unless your doctor tells you to stop. In some cases, you may be given additional medicines to help with  side effects. Follow all directions for their use. Call your doctor or health care professional for advice if you get a fever, chills or sore throat, or  other symptoms of a cold or flu. Do not treat yourself. This drug decreases your body's ability to fight infections. Try to avoid being around people who are sick. This medicine may increase your risk to bruise or bleed. Call your doctor or health care professional if you notice any unusual bleeding. Be careful brushing and flossing your teeth or using a toothpick because you may get an infection or bleed more easily. If you have any dental work done, tell your dentist you are receiving this medicine. Avoid taking products that contain aspirin, acetaminophen, ibuprofen, naproxen, or ketoprofen unless instructed by your doctor. These medicines may hide a fever. Do not become pregnant while taking this medicine. Women should inform their doctor if they wish to become pregnant or think they might be pregnant. There is a potential for serious side effects to an unborn child. Talk to your health care professional or pharmacist for more information. Do not breast-feed an infant while taking this medicine. What side effects may I notice from receiving this medicine? Side effects that you should report to your doctor or health care professional as soon as possible: -allergic reactions like skin rash, itching or hives, swelling of the face, lips, or tongue -signs of infection - fever or chills, cough, sore throat, pain or difficulty passing urine -signs of decreased platelets or bleeding - bruising, pinpoint red spots on the skin, black, tarry stools, nosebleeds -signs of decreased red blood cells - unusually weak or tired, fainting spells, lightheadedness -breathing problems -changes in hearing -changes in vision -chest pain -high blood pressure -low blood counts - This drug may decrease the number of white blood cells, red blood cells and platelets. You may be at increased risk for infections and bleeding. -nausea and vomiting -pain, swelling, redness or irritation at the injection site -pain, tingling,  numbness in the hands or feet -problems with balance, talking, walking -trouble passing urine or change in the amount of urine Side effects that usually do not require medical attention (report to your doctor or health care professional if they continue or are bothersome): -hair loss -loss of appetite -metallic taste in the mouth or changes in taste This list may not describe all possible side effects. Call your doctor for medical advice about side effects. You may report side effects to FDA at 1-800-FDA-1088. Where should I keep my medicine? This drug is given in a hospital or clinic and will not be stored at home. NOTE: This sheet is a summary. It may not cover all possible information. If you have questions about this medicine, talk to your doctor, pharmacist, or health care provider.  2018 Elsevier/Gold Standard (2007-07-16 14:38:05)   Etoposide, VP-16 injection What is this medicine? ETOPOSIDE, VP-16 (e toe POE side) is a chemotherapy drug. It is used to treat testicular cancer, lung cancer, and other cancers. This medicine may be used for other purposes; ask your health care provider or pharmacist if you have questions. COMMON BRAND NAME(S): Etopophos, Toposar, VePesid What should I tell my health care provider before I take this medicine? They need to know if you have any of these conditions: -infection -kidney disease -liver disease -low blood counts, like low white cell, platelet, or red cell counts -an unusual or allergic reaction to etoposide, other medicines, foods, dyes, or preservatives -pregnant or trying to get pregnant -breast-feeding  How should I use this medicine? This medicine is for infusion into a vein. It is administered in a hospital or clinic by a specially trained health care professional. Talk to your pediatrician regarding the use of this medicine in children. Special care may be needed. Overdosage: If you think you have taken too much of this medicine  contact a poison control center or emergency room at once. NOTE: This medicine is only for you. Do not share this medicine with others. What if I miss a dose? It is important not to miss your dose. Call your doctor or health care professional if you are unable to keep an appointment. What may interact with this medicine? -aspirin -certain medications for seizures like carbamazepine, phenobarbital, phenytoin, valproic acid -cyclosporine -levamisole -warfarin This list may not describe all possible interactions. Give your health care provider a list of all the medicines, herbs, non-prescription drugs, or dietary supplements you use. Also tell them if you smoke, drink alcohol, or use illegal drugs. Some items may interact with your medicine. What should I watch for while using this medicine? Visit your doctor for checks on your progress. This drug may make you feel generally unwell. This is not uncommon, as chemotherapy can affect healthy cells as well as cancer cells. Report any side effects. Continue your course of treatment even though you feel ill unless your doctor tells you to stop. In some cases, you may be given additional medicines to help with side effects. Follow all directions for their use. Call your doctor or health care professional for advice if you get a fever, chills or sore throat, or other symptoms of a cold or flu. Do not treat yourself. This drug decreases your body's ability to fight infections. Try to avoid being around people who are sick. This medicine may increase your risk to bruise or bleed. Call your doctor or health care professional if you notice any unusual bleeding. Talk to your doctor about your risk of cancer. You may be more at risk for certain types of cancers if you take this medicine. Do not become pregnant while taking this medicine or for at least 6 months after stopping it. Women should inform their doctor if they wish to become pregnant or think they might be  pregnant. Women of child-bearing potential will need to have a negative pregnancy test before starting this medicine. There is a potential for serious side effects to an unborn child. Talk to your health care professional or pharmacist for more information. Do not breast-feed an infant while taking this medicine. Men must use a latex condom during sexual contact with a woman while taking this medicine and for at least 4 months after stopping it. A latex condom is needed even if you have had a vasectomy. Contact your doctor right away if your partner becomes pregnant. Do not donate sperm while taking this medicine and for at least 4 months after you stop taking this medicine. Men should inform their doctors if they wish to father a child. This medicine may lower sperm counts. What side effects may I notice from receiving this medicine? Side effects that you should report to your doctor or health care professional as soon as possible: -allergic reactions like skin rash, itching or hives, swelling of the face, lips, or tongue -low blood counts - this medicine may decrease the number of white blood cells, red blood cells and platelets. You may be at increased risk for infections and bleeding. -signs of infection - fever or  chills, cough, sore throat, pain or difficulty passing urine -signs of decreased platelets or bleeding - bruising, pinpoint red spots on the skin, black, tarry stools, blood in the urine -signs of decreased red blood cells - unusually weak or tired, fainting spells, lightheadedness -breathing problems -changes in vision -mouth or throat sores or ulcers -pain, redness, swelling or irritation at the injection site -pain, tingling, numbness in the hands or feet -redness, blistering, peeling or loosening of the skin, including inside the mouth -seizures -vomiting Side effects that usually do not require medical attention (report to your doctor or health care professional if they continue  or are bothersome): -diarrhea -hair loss -loss of appetite -nausea -stomach pain This list may not describe all possible side effects. Call your doctor for medical advice about side effects. You may report side effects to FDA at 1-800-FDA-1088. Where should I keep my medicine? This drug is given in a hospital or clinic and will not be stored at home. NOTE: This sheet is a summary. It may not cover all possible information. If you have questions about this medicine, talk to your doctor, pharmacist, or health care provider.  2018 Elsevier/Gold Standard (2015-04-02 11:53:23)

## 2016-11-28 NOTE — Progress Notes (Signed)
Thoracic Location of Tumor / Histology: small cell lung cancer diagnosed 10/2016 with large left upper lobe and hilar mass in addition to questionable mediastinal lymphadenopathy.  Patient presented complaining of worsening shortness of breath since May 2018. He had chest x-ray on 10/29/2016 and it showed interval development of extensive consolidation within the left mid and lower lung concerning for pneumonia. He was treated with a course of antibiotics and repeat chest x-ray on 11/05/2016 showed interval worsening of the left mid to lower lung field airspace disease with increase in the size of the left pleural effusion. Underlying hilar mass was not entirely excluded    Tobacco/Marijuana/Snuff/ETOH use: current everyday smoker. Smokes 5 cigarette per day. Reports he has smoked for 51 years.   Past/Anticipated interventions by cardiothoracic surgery, if any: no  Past/Anticipated interventions by medical oncology, if any: systemic chemotherapy with carboplatin for AUC of 5 on day 1 and etoposide 120 MG/M2 on days 1, 2 and 3 every 3 weeks. Patient received first dose of chemotherapy on Tuesday 8/7. Also, ordered PET and MRI to be done at [facility] on 11/29/16. MRI was done. PET had to be rescheduled to Thursday 8/16 because of elevated blood sugar.  Signs/Symptoms  Weight changes, if any: weight gain. Present weight 204 lb. Healthy weight 160 lb.   Respiratory complaints, if any: productive cough with thick white sputum and shortness of breath at baseline and increased with exertion. He is using home oxygen therapy  Hemoptysis, if any: no  Pain issues, if any:  Pain across top of diaphragm  SAFETY ISSUES:  Prior radiation? no  Pacemaker/ICD? no   Possible current pregnancy?no  Is the patient on methotrexate? no  Current Complaints / other details:  66 year old male. Married to [place] for 20 years. Reports weakness. Reports occasional headaches. 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 Denies nausea, vomiting, dizziness, or diplopia. Patient reports that he understands the chest xray from last night showed the pleural effusion has returned. Pitting edema noted bilateral lower extremities. Lives in [place]. Abdominal distention noted. Reports he had a bowel movement this morning. Reports increased pain and cough within the last 24 hours. Reports new onset of indigestion and hiccups. Resides in [place].

## 2016-11-29 ENCOUNTER — Ambulatory Visit
Admission: RE | Admit: 2016-11-29 | Discharge: 2016-11-29 | Disposition: A | Discharge status: Home / Self Care | Payer: PPO | Source: Ambulatory Visit | Attending: Internal Medicine | Admitting: Internal Medicine

## 2016-11-29 ENCOUNTER — Emergency Department (HOSPITAL_COMMUNITY)
Admission: EM | Admit: 2016-11-29 | Discharge: 2016-11-29 | Disposition: A | Discharge status: Home / Self Care | Payer: PPO | Attending: Emergency Medicine | Admitting: Emergency Medicine

## 2016-11-29 ENCOUNTER — Telehealth: Payer: Self-pay

## 2016-11-29 ENCOUNTER — Ambulatory Visit: Payer: PPO

## 2016-11-29 ENCOUNTER — Emergency Department (HOSPITAL_COMMUNITY): Payer: PPO

## 2016-11-29 DIAGNOSIS — R0902 Hypoxemia: Secondary | ICD-10-CM | POA: Insufficient documentation

## 2016-11-29 DIAGNOSIS — N183 Chronic kidney disease, stage 3 (moderate): Secondary | ICD-10-CM | POA: Diagnosis not present

## 2016-11-29 DIAGNOSIS — J449 Chronic obstructive pulmonary disease, unspecified: Secondary | ICD-10-CM | POA: Diagnosis not present

## 2016-11-29 DIAGNOSIS — Z79899 Other long term (current) drug therapy: Secondary | ICD-10-CM | POA: Diagnosis not present

## 2016-11-29 DIAGNOSIS — G319 Degenerative disease of nervous system, unspecified: Secondary | ICD-10-CM | POA: Diagnosis not present

## 2016-11-29 DIAGNOSIS — C3412 Malignant neoplasm of upper lobe, left bronchus or lung: Secondary | ICD-10-CM | POA: Diagnosis not present

## 2016-11-29 DIAGNOSIS — I130 Hypertensive heart and chronic kidney disease with heart failure and stage 1 through stage 4 chronic kidney disease, or unspecified chronic kidney disease: Secondary | ICD-10-CM | POA: Diagnosis not present

## 2016-11-29 DIAGNOSIS — F17210 Nicotine dependence, cigarettes, uncomplicated: Secondary | ICD-10-CM | POA: Insufficient documentation

## 2016-11-29 DIAGNOSIS — I2510 Atherosclerotic heart disease of native coronary artery without angina pectoris: Secondary | ICD-10-CM | POA: Insufficient documentation

## 2016-11-29 DIAGNOSIS — Z7982 Long term (current) use of aspirin: Secondary | ICD-10-CM | POA: Diagnosis not present

## 2016-11-29 DIAGNOSIS — C3490 Malignant neoplasm of unspecified part of unspecified bronchus or lung: Secondary | ICD-10-CM | POA: Diagnosis not present

## 2016-11-29 DIAGNOSIS — R9089 Other abnormal findings on diagnostic imaging of central nervous system: Secondary | ICD-10-CM | POA: Insufficient documentation

## 2016-11-29 DIAGNOSIS — E119 Type 2 diabetes mellitus without complications: Secondary | ICD-10-CM | POA: Diagnosis not present

## 2016-11-29 DIAGNOSIS — I509 Heart failure, unspecified: Secondary | ICD-10-CM | POA: Diagnosis not present

## 2016-11-29 DIAGNOSIS — R0602 Shortness of breath: Secondary | ICD-10-CM | POA: Diagnosis not present

## 2016-11-29 DIAGNOSIS — J9611 Chronic respiratory failure with hypoxia: Secondary | ICD-10-CM | POA: Diagnosis not present

## 2016-11-29 LAB — GLUCOSE, CAPILLARY: Glucose-Capillary: 272 mg/dL — ABNORMAL HIGH (ref 65–99)

## 2016-11-29 MED ORDER — IPRATROPIUM-ALBUTEROL 0.5-2.5 (3) MG/3ML IN SOLN
3.0000 mL | Freq: Once | RESPIRATORY_TRACT | Status: AC
  Administered 2016-11-29: 3 mL via RESPIRATORY_TRACT
  Filled 2016-11-29: qty 3

## 2016-11-29 MED ORDER — GADOBENATE DIMEGLUMINE 529 MG/ML IV SOLN
17.0000 mL | Freq: Once | INTRAVENOUS | Status: AC | PRN
  Administered 2016-11-29: 17 mL via INTRAVENOUS

## 2016-11-29 NOTE — Telephone Encounter (Signed)
Received call report on MRI brain done today at [facility] radiology

## 2016-11-29 NOTE — ED Triage Notes (Signed)
Pt was brought in from the cancer after becoming hypoxic in the lobby. Pt is currently a pt of Dr. Julien Nordmann and is currently being treated for lung cancer. Pt received a chemotherapy treatment yesterday and started experiencing left arm and chest pain last night.  Prior to arriving in the ED, pt last set of vitals were BP: 113/82, T: 98.4, P: 101, RR: 20, O2: 91 on 2L. Pt wife at bedside.

## 2016-11-29 NOTE — Telephone Encounter (Signed)
Verbal order given to Quillian Quince at [employer] in [place] [phone] for humidification for oxygen.    They will ship out humidification tubing that patient can place on oxygen tubing.   It should arrive by tomorrow or Friday.  Spoke with [name] and verbally gave information above she verbalized understanding.

## 2016-11-29 NOTE — Telephone Encounter (Signed)
Vinnie Level from the PET center called that the pt blood sugar is 272. The protocol is the sugar has to be less than 200. Per the wife this is the lowest it has been recently with the chemo. Vinnie Level was asking if it was OK to r/s to next week when pt is not receiving chemo with dex premed. S/w Dr Julien Nordmann and gave OK to r/s PET to next week. PET is 8/16

## 2016-11-29 NOTE — ED Provider Notes (Signed)
[name]   CSN: [id number] Arrival date & time: 11/29/16  1655     History   Chief Complaint Chief Complaint  Patient presents with  . Hypoxia    HPI Kevin Gutierrez is a 66 y.o. male.  The history is provided by the patient and the spouse. No language interpreter was used.    Kevin Gutierrez is a 66 y.o. male who presents to the Emergency Department complaining of hypoxia.He has a history of COPD and lung cancer, on chronic oxygen at 2-1/2 L. He has been fatigued from recent treatments and was unable to get his PET scan today due to hyperglycemia. His wife was bringing him to the cancer center for his treatments. In the car his oxygen had fallen off and she was unable to replace it because she was driving. His oxygen had been off for about 20 minutes. When they arrived to the [facility] his sats were 66% and his oxygen was replaced. She reports it has been more sleepy lately. In the emergency department he has no complaints. He says he is chronically short of breath and has a chronic cough and this is unchanged from baseline.  Past Medical History:  Diagnosis Date  . Chicken pox   . COPD (chronic obstructive pulmonary disease) ([place])   . History of kidney stones 05/17/2015  . History of stomach ulcers 05/17/2015  . Hypertension   . Kidney stones   . Lung cancer ([place])   . Nephrolithiasis   . Peptic ulcer disease   . Vitamin D deficiency     Patient Active Problem List   Diagnosis Date Noted  . Encounter for antineoplastic chemotherapy 11/15/2016  . Goals of care, counseling/discussion 11/15/2016  . Type 2 diabetes mellitus ([place]) 11/11/2016  . 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 Atelectasis   . Chronic CHF (congestive heart failure) ([place])   . Coronary artery disease   . Chronic respiratory failure ([place]) 06/13/2016  . Anxiety state 04/05/2016  . DDD (degenerative disc disease), lumbar 03/14/2016  . Vitamin D deficiency 02/22/2016  .  Hyperlipidemia 11/15/2015  . Basal cell carcinoma 11/15/2015  . CKD (chronic kidney disease) stage 3, GFR 30-59 ml/min 05/19/2015  . Insomnia 05/17/2015  . Essential hypertension 05/05/2015  . Tobacco abuse 05/05/2015    Past Surgical History:  Procedure Laterality Date  . LEFT HEART CATH AND CORONARY ANGIOGRAPHY N/A 11/07/2016   Procedure: Left Heart Cath and Coronary Angiography;  Surgeon: Troy Sine, MD;  Location: [facility];  Service: Cardiovascular;  Laterality: N/A;  . LITHOTRIPSY    . SPINAL CORD DECOMPRESSION  03/08/1994  . VIDEO BRONCHOSCOPY N/A 11/08/2016   Procedure: VIDEO BRONCHOSCOPY;  Surgeon: Collene Gobble, MD;  Location: [facility];  Service: Cardiopulmonary;  Laterality: N/A;       Home Medications    Prior to Admission medications   Medication Sig Start Date End Date Taking? Authorizing Provider  albuterol (PROVENTIL) (2.5 MG/3ML) 0.083% nebulizer solution Take 3 mLs (2.5 mg total) by nebulization every 3 (three) hours as needed for wheezing or shortness of breath. 08/04/16  Yes Cook, Jayce G, DO  albuterol (VENTOLIN HFA) 108 (90 Base) MCG/ACT inhaler Inhale 2 puffs into the lungs every 4 (four) hours as needed for wheezing or shortness of breath. 07/18/16  Yes Flora Lipps, MD  aspirin EC 81 MG tablet Take 1 tablet (81 mg total) by mouth daily. 05/17/15  Yes Coral Spikes, DO  atorvastatin (LIPITOR) 20 MG tablet Take 1 tablet (20 mg total) by mouth daily. 08/04/16  Yes Cook, Jayce G, DO  clonazePAM (KLONOPIN) 0.5 MG tablet Take 1 tablet (0.5 mg total) by mouth 2 (two) times daily as needed for anxiety. 11/02/16  Yes Cook, Jayce G, DO  furosemide (LASIX) 40 MG tablet Take 1 tablet (40 mg total) by mouth daily. 11/20/16  Yes Cook, Jayce G, DO  glipiZIDE (GLUCOTROL XL) 5 MG 24 hr tablet Take 1 tablet (5 mg total) by mouth daily with breakfast. 11/20/16  Yes Cook, Jayce G, DO  HYDROcodone-acetaminophen (NORCO) 10-325 MG tablet Take 1 tablet by mouth every 8 (eight)  hours as needed. Patient taking differently: Take 1 tablet by mouth every 6 (six) hours as needed for severe pain.  11/20/16  Yes Cook, Jayce G, DO  isosorbide-hydrALAZINE (BIDIL) 20-37.5 MG tablet Take 1 tablet by mouth 3 (three) times daily. 11/11/16 12/11/16 Yes Johnson, Clanford L, MD  metoprolol tartrate (LOPRESSOR) 25 MG tablet Take 1 tablet (25 mg total) by mouth 2 (two) times daily. 11/11/16 12/11/16 Yes Johnson, Clanford L, MD  nystatin (MYCOSTATIN) 100000 UNIT/ML suspension TAKE  5 ML BY MOUTH 4 TIMES DAILY 11/14/16  Yes Kasa, Maretta Bees, MD  pantoprazole (PROTONIX) 40 MG tablet Take 1 tablet (40 mg total) by mouth daily. 08/04/16  Yes Cook, Jayce G, DO  prochlorperazine (COMPAZINE) 10 MG tablet Take 1 tablet (10 mg total) by mouth every 6 (six) hours as needed for nausea or vomiting. 11/15/16  Yes Curt Bears, MD  tiotropium (SPIRIVA) 18 MCG inhalation capsule Place 1 capsule (18 mcg total) into inhaler and inhale daily. 08/30/16  Yes Flora Lipps, MD  blood glucose meter kit and supplies Dispense based on patient and insurance preference. Use up to four times daily as directed. (FOR ICD-10 E11.9). 11/11/16   Johnson, Clanford L, MD  Fluticasone-Umeclidin-Vilant (TRELEGY ELLIPTA) 100-62.5-25 MCG/INH AEPB Inhale 1 Dose into the lungs daily. Patient not taking: Reported on 11/29/2016 11/20/16   Coral Spikes, DO    Family History Family History  Problem Relation Age of Onset  . Diabetes Mellitus II Brother   . Lung cancer Brother   . Heart disease Brother        CABG in his 15's  . Hyperlipidemia Brother   . Heart disease Mother        MI in her 20's  . Stroke Father   . Hypertension Brother     Social History Social History  Substance Use Topics  . Smoking status: Current Every Day Smoker    Packs/day: 0.50    Years: 50.00    Types: Cigarettes  . Smokeless tobacco: Never Used     Comment: Currently smoking 4 cigarettes daily  . Alcohol use No     Allergies   No known  allergies   Review of Systems Review of Systems  All other systems reviewed and are negative.    Physical Exam Updated Vital Signs BP 129/85   Pulse 91   Resp 18   SpO2 90%   Physical Exam  Constitutional: He is oriented to person, place, and time. He appears well-developed and well-nourished.  HENT:  Head: Normocephalic and atraumatic.  Cardiovascular: Normal rate and regular rhythm.   No murmur heard. Pulmonary/Chest: Effort normal. No respiratory distress.  Decreased air movement bilaterally with occasional end expiratory wheeze  Abdominal: Soft. There is no tenderness. There is no rebound and no guarding.  Musculoskeletal: He exhibits no tenderness.  2-3+ pitting  edema to bilateral lower extremities  Neurological: He is alert and oriented to person, place, and time.  Skin: Skin is warm and dry.  Psychiatric: He has a normal mood and affect. His behavior is normal.  Nursing note and vitals reviewed.    ED Treatments / Results  Labs (all labs ordered are listed, but only abnormal results are displayed) Labs Reviewed - No data to display  EKG  EKG Interpretation None       Radiology Dg Chest 2 View  Result Date: 11/29/2016 CLINICAL DATA:  Shortness of breath.  Currently on chemotherapy. EXAM: CHEST  2 VIEW COMPARISON:  Chest radiograph 11/06/2016 FINDINGS: Small left pleural effusion. Parahilar opacities in the left lung are unchanged and consistent with an obstructing mass of the left hilum. There is no new area of airspace consolidation. The right lung remains relatively clear. No pneumothorax. IMPRESSION: 1. Left parahilar opacities are consistent with a hilar mass and postobstructive atelectasis, as suggested by recent CT. 2. Increased size of left pleural effusion, which appears loculated medially. Electronically Signed   By: Ulyses Jarred M.D.   On: 11/29/2016 18:36     Procedures Procedures (including critical care time)  Medications Ordered in  ED Medications  ipratropium-albuterol (DUONEB) 0.5-2.5 (3) MG/3ML nebulizer solution 3 mL (3 mLs Nebulization Given 11/29/16 1853)     Initial Impression / Assessment and Plan / ED Course  I have reviewed the triage vital signs and the nursing notes.  Pertinent labs & imaging results that were available during my care of the patient were reviewed by me and considered in my medical decision making (see chart for details).     Patient with COPD and lung cancer here for evaluation of hypoxia with sats in the 47s. After being replaced on his oxygen his sats are at his baseline of about 90% when oxygen at rest. Chest x-ray does demonstrate enlarging infusion but no evidence of acute pneumonia. Patient refuses any sort of laboratory evaluation in the emergency department. Discussed with patient potential missed diagnosis of renal failure, heart failure or new acute illness. Patient and wife understand this and wished to go home on his home oxygen. Discussed importance of ongoing oncology follow-up as well as the importance of maintaining his oxygen on at all times.  Final Clinical Impressions(s) / ED Diagnoses   Final diagnoses:  Hypoxia    New Prescriptions New Prescriptions   No medications on file     Quintella Reichert, MD 11/30/16 0006

## 2016-11-30 ENCOUNTER — Ambulatory Visit
Admission: RE | Admit: 2016-11-30 | Discharge: 2016-11-30 | Disposition: A | Discharge status: Home / Self Care | Payer: PPO | Source: Ambulatory Visit | Attending: Radiation Oncology | Admitting: Radiation Oncology

## 2016-11-30 ENCOUNTER — Encounter: Payer: Self-pay | Admitting: Radiation Oncology

## 2016-11-30 ENCOUNTER — Telehealth: Payer: Self-pay

## 2016-11-30 ENCOUNTER — Ambulatory Visit (HOSPITAL_BASED_OUTPATIENT_CLINIC_OR_DEPARTMENT_OTHER): Payer: PPO

## 2016-11-30 VITALS — BP 110/78 | HR 90 | Temp 97.8°F | Resp 16 | Ht 72.0 in | Wt 204.0 lb

## 2016-11-30 DIAGNOSIS — Z7984 Long term (current) use of oral hypoglycemic drugs: Secondary | ICD-10-CM | POA: Diagnosis not present

## 2016-11-30 DIAGNOSIS — I119 Hypertensive heart disease without heart failure: Secondary | ICD-10-CM | POA: Insufficient documentation

## 2016-11-30 DIAGNOSIS — Z9889 Other specified postprocedural states: Secondary | ICD-10-CM | POA: Insufficient documentation

## 2016-11-30 DIAGNOSIS — Z833 Family history of diabetes mellitus: Secondary | ICD-10-CM | POA: Insufficient documentation

## 2016-11-30 DIAGNOSIS — Z5111 Encounter for antineoplastic chemotherapy: Secondary | ICD-10-CM | POA: Diagnosis not present

## 2016-11-30 DIAGNOSIS — R600 Localized edema: Secondary | ICD-10-CM | POA: Diagnosis not present

## 2016-11-30 DIAGNOSIS — Z8719 Personal history of other diseases of the digestive system: Secondary | ICD-10-CM | POA: Insufficient documentation

## 2016-11-30 DIAGNOSIS — Z79899 Other long term (current) drug therapy: Secondary | ICD-10-CM | POA: Diagnosis not present

## 2016-11-30 DIAGNOSIS — Z823 Family history of stroke: Secondary | ICD-10-CM | POA: Diagnosis not present

## 2016-11-30 DIAGNOSIS — Z8249 Family history of ischemic heart disease and other diseases of the circulatory system: Secondary | ICD-10-CM | POA: Insufficient documentation

## 2016-11-30 DIAGNOSIS — R0902 Hypoxemia: Secondary | ICD-10-CM | POA: Diagnosis not present

## 2016-11-30 DIAGNOSIS — E559 Vitamin D deficiency, unspecified: Secondary | ICD-10-CM | POA: Diagnosis not present

## 2016-11-30 DIAGNOSIS — Z7982 Long term (current) use of aspirin: Secondary | ICD-10-CM | POA: Insufficient documentation

## 2016-11-30 DIAGNOSIS — I700 Atherosclerosis of aorta: Secondary | ICD-10-CM | POA: Diagnosis not present

## 2016-11-30 DIAGNOSIS — C3412 Malignant neoplasm of upper lobe, left bronchus or lung: Secondary | ICD-10-CM | POA: Diagnosis not present

## 2016-11-30 DIAGNOSIS — Z79891 Long term (current) use of opiate analgesic: Secondary | ICD-10-CM | POA: Insufficient documentation

## 2016-11-30 DIAGNOSIS — Z87442 Personal history of urinary calculi: Secondary | ICD-10-CM | POA: Insufficient documentation

## 2016-11-30 DIAGNOSIS — Z801 Family history of malignant neoplasm of trachea, bronchus and lung: Secondary | ICD-10-CM | POA: Diagnosis not present

## 2016-11-30 DIAGNOSIS — J90 Pleural effusion, not elsewhere classified: Secondary | ICD-10-CM | POA: Insufficient documentation

## 2016-11-30 DIAGNOSIS — F17210 Nicotine dependence, cigarettes, uncomplicated: Secondary | ICD-10-CM | POA: Diagnosis not present

## 2016-11-30 DIAGNOSIS — J449 Chronic obstructive pulmonary disease, unspecified: Secondary | ICD-10-CM | POA: Insufficient documentation

## 2016-11-30 DIAGNOSIS — Z9981 Dependence on supplemental oxygen: Secondary | ICD-10-CM | POA: Insufficient documentation

## 2016-11-30 HISTORY — DX: Malignant neoplasm of unspecified part of unspecified bronchus or lung: C34.90

## 2016-11-30 MED ORDER — DEXAMETHASONE SODIUM PHOSPHATE 10 MG/ML IJ SOLN
INTRAMUSCULAR | Status: AC
  Filled 2016-11-30: qty 1

## 2016-11-30 MED ORDER — DEXAMETHASONE SODIUM PHOSPHATE 10 MG/ML IJ SOLN
10.0000 mg | Freq: Once | INTRAMUSCULAR | Status: AC
  Administered 2016-11-30: 10 mg via INTRAVENOUS

## 2016-11-30 MED ORDER — ETOPOSIDE CHEMO INJECTION 1 GM/50ML
75.0000 mg/m2 | Freq: Once | INTRAVENOUS | Status: AC
  Administered 2016-11-30: 160 mg via INTRAVENOUS
  Filled 2016-11-30: qty 8

## 2016-11-30 MED ORDER — SODIUM CHLORIDE 0.9 % IV SOLN: Freq: Once | INTRAVENOUS | Status: DC

## 2016-11-30 NOTE — Patient Instructions (Signed)
[facility] Discharge Instructions for Patients Receiving Chemotherapy  Today you received the following chemotherapy agents:  Etoposide.  To help prevent nausea and vomiting after your treatment, we encourage you to take your nausea medication as directed.   If you develop nausea and vomiting that is not controlled by your nausea medication, call the clinic.   BELOW ARE SYMPTOMS THAT SHOULD BE REPORTED IMMEDIATELY:  *FEVER GREATER THAN 100.5 F  *CHILLS WITH OR WITHOUT FEVER  NAUSEA AND VOMITING THAT IS NOT CONTROLLED WITH YOUR NAUSEA MEDICATION  *UNUSUAL SHORTNESS OF BREATH  *UNUSUAL BRUISING OR BLEEDING  TENDERNESS IN MOUTH AND THROAT WITH OR WITHOUT PRESENCE OF ULCERS  *URINARY PROBLEMS  *BOWEL PROBLEMS  UNUSUAL RASH Items with * indicate a potential emergency and should be followed up as soon as possible.  Feel free to call the clinic you have any questions or concerns. The clinic phone number is [phone].  Please show the [facility] at check-in to the Emergency Department and triage nurse.

## 2016-11-30 NOTE — Telephone Encounter (Signed)
Around 1600 on 11/29/17--  Patient was waiting in the lobby to be registered. Due to Epic Downtime he did not get checked in. His wife asked for a nurse to see him. He was agitated and confused. The wife states that he was short of breath. He was found to have an O2 sat of 66% on 2.5L. We brought him back to infusion and per Md. Order, we started an IV and began running some IV fluids. On 5L of O2 he was saturating 95%. Patient becomes more alert and oriented. We escorted him to the emergency department and gave a brief report to [place], [place]. Dr. Julien Nordmann ordered for the chemo to not be given today and he plans to see the patient in the hospital.

## 2016-11-30 NOTE — Progress Notes (Signed)
Radiation Oncology         [phone] ________________________________  Initial outpatient Consultation  Name: Kevin Gutierrez MRN: [id number]  Date: 11/30/2016  DOB: [birth year]  SE:GBTD, Barnie Del, DO  Curt Bears, MD   REFERRING PHYSICIAN: Curt Bears, MD  DIAGNOSIS: 66 y.o. 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 Small cell lung cancer, left upper lobe (HCC) C34.12     HISTORY OF PRESENT ILLNESS: Kevin Gutierrez is a 66 y.o. male seen at the request of Dr. Julien Nordmann. The patient presented complaining of worsening shortness of breath since May 2018. He had chest x-ray on 10/29/2016 which showed interval development of extensive consolidation within the left mid and lower lung concerning for pneumonia. He was treated with a course of antibiotics but presented back to the ER on 11/05/16 due to worsening shortness of breath and bilateral LE edema with pulse O2 at home of 67%.  Repeat chest x-ray on 11/05/2016 showed interval worsening of the left mid to lower lung field airspace disease with increase in the size of the left pleural effusion. Underlying hilar mass was not entirely excluded. The patient underwent CT chest without contrast on 11/05/2016 which showed apparent abrupt termination of the left upper lobe bronchus with large area of consolidative changes in the left upper lobe and primarily lingula. There was fullness of the left hilar soft tissue concerning for hilar mass as well as a small left pleural effusion and left lower lobe consolidative changes with multiple mildly prominent mediastinal lymph nodes. Left-sided thoracentesis was performed on 11/06/2016 and 800 ml of cloudy pleural fluid was obtained. Cytopathology showed atypical cells.   The patient was seen by Dr. Lamonte Sakai and underwent flexible video fiberoptic bronchoscopy with biopsies on 11/08/2016. The procedure showed a fungating white/gray mass emanating from the lingular and  left upper airways occluding almost all of the distal left upper lobe bronchus. Final pathology showed a small cell carcinoma. Immunohistochemical stains showed strong positivity with TTF-1, CD 56, and cytokeratin AE 1/AE3. Morphology and immunophenotyping most consistent with a small cell carcinoma. Transbronchial needle aspiration of the left upper lobe- Cytopathology revealed malignant cells consistent with small cell carcinoma.  The patient met with Dr. Julien Nordmann on 11/15/2016 and began chemotherapy with carboplatin and etopside on 11/28/2016.   He presented to the ED at [facility] yesterday complaining of hypoxia. Patient was noted to have decreased air movement bilaterally with occasional end expiratory wheeze on physical exam. Accordingly, repeat chest x-ray was performed. This scan showed left parahilar opacities consistent with a hilar mass and postobstructive atelectasis, as suggested by recent CT. Also noted is increased size of left pleural effusion, which appears lobulated medially.   Brain MRI on 11/29/16 showed cortical enhancing regions in the right frontal and right parietal lobe with restricted diffusion and are most likely related to subacute infarct. No obvious intracranial metastatic disease.  PET scan was unable to be completed as scheduled yesterday due to the patient's glucose level. PET scan has been rescheduled for next Thursday, 12/07/2016.   The patient is here to discuss radiation treatment options in the management of his disease.  PREVIOUS RADIATION THERAPY: No  PAST MEDICAL HISTORY:  Past Medical History:  Diagnosis Date  . Chicken pox   . COPD (chronic obstructive pulmonary disease) ([place])   . History of kidney stones 05/17/2015  . History of stomach ulcers 05/17/2015  . Hypertension   . Kidney stones   .  Lung cancer ([place])   . Nephrolithiasis   . Peptic ulcer disease   . Vitamin D deficiency       PAST SURGICAL HISTORY: Past Surgical History:  Procedure Laterality Date   . LEFT HEART CATH AND CORONARY ANGIOGRAPHY N/A 11/07/2016   Procedure: Left Heart Cath and Coronary Angiography;  Surgeon: Troy Sine, MD;  Location: [facility];  Service: Cardiovascular;  Laterality: N/A;  . LITHOTRIPSY    . SPINAL CORD DECOMPRESSION  03/08/1994  . VIDEO BRONCHOSCOPY N/A 11/08/2016   Procedure: VIDEO BRONCHOSCOPY;  Surgeon: Collene Gobble, MD;  Location: [facility];  Service: Cardiopulmonary;  Laterality: N/A;    FAMILY HISTORY:  Family History  Problem Relation Age of Onset  . Diabetes Mellitus II Brother   . Lung cancer Brother   . Heart disease Brother        CABG in his 77's  . Hyperlipidemia Brother   . Heart disease Mother        MI in her 43's  . Stroke Father   . Hypertension Brother     SOCIAL HISTORY:  Social History   Social History  . Marital status: Married    Spouse name: N/A  . Number of children: N/A  . Years of education: N/A   Occupational History  . retired    Social History Main Topics  . Smoking status: Current Every Day Smoker    Packs/day: 0.50    Years: 50.00    Types: Cigarettes  . Smokeless tobacco: Never Used     Comment: Currently smoking 5 cigarettes daily  . Alcohol use No  . Drug use: Yes    Types: Marijuana     Comment: Occasional marijuana use  . Sexual activity: Yes   Other Topics Concern  . Not on file   Social History Narrative   Lives with family    ALLERGIES: No known allergies  MEDICATIONS:  Current Outpatient Prescriptions  Medication Sig Dispense Refill  . albuterol (PROVENTIL) (2.5 MG/3ML) 0.083% nebulizer solution Take 3 mLs (2.5 mg total) by nebulization every 3 (three) hours as needed for wheezing or shortness of breath. 75 mL 12  . albuterol (VENTOLIN HFA) 108 (90 Base) MCG/ACT inhaler Inhale 2 puffs into the lungs every 4 (four) hours as needed for wheezing or shortness of breath. 1 Inhaler 3  . aspirin EC 81 MG tablet Take 1 tablet (81 mg total) by mouth daily.    Marland Kitchen atorvastatin  (LIPITOR) 20 MG tablet Take 1 tablet (20 mg total) by mouth daily. 90 tablet 3  . blood glucose meter kit and supplies Dispense based on patient and insurance preference. Use up to four times daily as directed. (FOR ICD-10 E11.9). 1 each 0  . clonazePAM (KLONOPIN) 0.5 MG tablet Take 1 tablet (0.5 mg total) by mouth 2 (two) times daily as needed for anxiety. 60 tablet 0  . furosemide (LASIX) 40 MG tablet Take 1 tablet (40 mg total) by mouth daily. 30 tablet 1  . glipiZIDE (GLUCOTROL XL) 5 MG 24 hr tablet Take 1 tablet (5 mg total) by mouth daily with breakfast. 90 tablet 1  . isosorbide-hydrALAZINE (BIDIL) 20-37.5 MG tablet Take 1 tablet by mouth 3 (three) times daily. 90 tablet 0  . metoprolol tartrate (LOPRESSOR) 25 MG tablet Take 1 tablet (25 mg total) by mouth 2 (two) times daily. 60 tablet 0  . nystatin (MYCOSTATIN) 100000 UNIT/ML suspension TAKE  5 ML BY MOUTH 4 TIMES DAILY 473 mL 0  .  pantoprazole (PROTONIX) 40 MG tablet Take 1 tablet (40 mg total) by mouth daily. 90 tablet 0  . prochlorperazine (COMPAZINE) 10 MG tablet Take 1 tablet (10 mg total) by mouth every 6 (six) hours as needed for nausea or vomiting. 30 tablet 0  . tiotropium (SPIRIVA) 18 MCG inhalation capsule Place 1 capsule (18 mcg total) into inhaler and inhale daily. 30 capsule 5  . Fluticasone-Umeclidin-Vilant (TRELEGY ELLIPTA) 100-62.5-25 MCG/INH AEPB Inhale 1 Dose into the lungs daily. (Patient not taking: Reported on 11/29/2016) 60 each 3  . HYDROcodone-acetaminophen (NORCO) 10-325 MG tablet Take 1 tablet by mouth every 4 (four) hours as needed for severe pain (may take 1 tablet every 4-6 hours prn pain). 45 tablet 0   No current facility-administered medications for this encounter.     REVIEW OF SYSTEMS:  On review of systems, the patient reports that he is doing well overall. He reports productive cough with thick white sputum and shortness of breath at baseline, increased with exertion. He is using home oxygen therapy 2L via  nasal canula 24/7. He was originally just using oxygen with exertion for the past year. In May, he began using oxygen continually at 2L. He does not use oxygen while smoking. He denies hemoptysis. He reports dry throat and pain across top of diaphragm. He reports weakness and fatigue. He denies headache or visual changes. He denies any fevers, chills, night sweats, unintended weight changes. He denies any bowel or bladder disturbances, and denies nausea or vomiting. He reports fluid accumulation in the legs and ankles which initially improved with the recent addition of Lasix but has begun toreturn over the past 2-3 days. He is taking Lasix as prescribed and this is managed by his PCP, Dr. Lacinda Axon. He is scheduled for cardiology consult with Dr. Rockey Situ on 12/22/16. The patient is unable to elevate his legs well while sleeping or resting since he is a side-sleeper. He does try to keep his feet elevated while he is awake. A complete review of systems is obtained and is otherwise negative.    PHYSICAL EXAM:  Wt Readings from Last 3 Encounters:  11/30/16 204 lb (92.5 kg)  11/20/16 194 lb 8 oz (88.2 kg)  11/15/16 189 lb 12.8 oz (86.1 kg)   Temp Readings from Last 3 Encounters:  12/01/16 98 F (36.7 C) (Oral)  11/30/16 97.8 F (36.6 C) (Oral)  11/28/16 98 F (36.7 C) (Oral)   BP Readings from Last 3 Encounters:  12/01/16 114/69  11/30/16 110/78  11/29/16 129/85   Pulse Readings from Last 3 Encounters:  12/01/16 85  11/30/16 90  11/29/16 91   Pain Assessment Pain Score: 5  (across top of diaphragm)/10  In general this is a well appearing caucasian male in no acute distress. He presents in a wheelchair and has oxygen via nasal cannula in place at 2L. He is alert and oriented x4 and appropriate throughout the examination. HEENT reveals that the patient is normocephalic, atraumatic. EOMs are intact. PERRLA. Skin is intact without any evidence of gross lesions. Cardiovascular exam reveals a regular  rate and rhythm, no clicks rubs or murmurs are auscultated. Chest is clear to auscultation bilaterally. Lymphatic assessment is performed and does not reveal any adenopathy in the cervical, supraclavicular, axillary, or inguinal chains. Abdomen has active bowel sounds in all quadrants and is intact. The abdomen is soft, non tender, non distended. Lower extremities are positive for 3+ pitting edema from his feet up to his knees bilaterally.   KPS =  70  100 - Normal; no complaints; no evidence of disease. 90   - Able to carry on normal activity; minor signs or symptoms of disease. 80   - Normal activity with effort; some signs or symptoms of disease. 66   - Cares for self; unable to carry on normal activity or to do active work. 60   - Requires occasional assistance, but is able to care for most of his personal needs. 50   - Requires considerable assistance and frequent medical care. 90   - Disabled; requires special care and assistance. 77   - Severely disabled; hospital admission is indicated although death not imminent. 60   - Very sick; hospital admission necessary; active supportive treatment necessary. 10   - Moribund; fatal processes progressing rapidly. 0     - Dead  Karnofsky DA, Abelmann Wirt, Craver LS and Burchenal [facility] [phone]) The use of the nitrogen mustards in the palliative treatment of carcinoma: with particular reference to bronchogenic carcinoma Cancer 1 [id number]  LABORATORY DATA:  Lab Results  Component Value Date   WBC 11.3 (H) 11/28/2016   HGB 14.2 11/28/2016   HCT 43.4 11/28/2016   MCV 94.5 11/28/2016   PLT 98 (L) 11/28/2016   Lab Results  Component Value Date   NA 137 11/28/2016   K 3.8 11/28/2016   CL 92 (L) 11/11/2016   CO2 28 11/28/2016   Lab Results  Component Value Date   ALT 168 (H) 11/28/2016   AST 132 (H) 11/28/2016   ALKPHOS 270 (H) 11/28/2016   BILITOT 3.60 (HH) 11/28/2016     RADIOGRAPHY: Dg Chest 2 View  Result Date: 11/29/2016 CLINICAL DATA:   Shortness of breath.  Currently on chemotherapy. EXAM: CHEST  2 VIEW COMPARISON:  Chest radiograph 11/06/2016 FINDINGS: Small left pleural effusion. Parahilar opacities in the left lung are unchanged and consistent with an obstructing mass of the left hilum. There is no new area of airspace consolidation. The right lung remains relatively clear. No pneumothorax. IMPRESSION: 1. Left parahilar opacities are consistent with a hilar mass and postobstructive atelectasis, as suggested by recent CT. 2. Increased size of left pleural effusion, which appears loculated medially. Electronically Signed   By: Ulyses Jarred M.D.   On: 11/29/2016 18:36   Dg Chest 2 View  Result Date: 11/05/2016 CLINICAL DATA:  66 year old male with shortness of breath. EXAM: CHEST  2 VIEW COMPARISON:  Chest radiograph dated 10/29/2016 FINDINGS: There has been interval worsening of the airspace disease primarily involving the left mid to lower lung field. Although findings may be infectious in etiology, a left hilar mass is not expanded excluded. Clinical correlation is recommended. CT of the chest with contrast may provide better evaluation. There is a small left pleural effusion. Stable cardiac silhouette. There is pectus excavatum deformity. No acute osseous pathology. IMPRESSION: Interval worsening of the left mid to lower lung field airspace disease well increase in the size of the left pleural effusion. Findings may be infectious in etiology. However, underlying hilar mass is not entirely excluded. Clinical correlation is recommended. CT of the chest with contrast may provide better evaluation. Electronically Signed   By: Anner Crete M.D.   On: 11/05/2016 01:16   Ct Chest Wo Contrast  Result Date: 11/07/2016 CLINICAL DATA:  66 year old male with progressive dyspnea. Abnormal chest x-ray and CT. Subsequent encounter. EXAM: CT CHEST WITHOUT CONTRAST TECHNIQUE: Multidetector CT imaging of the chest was performed following the  standard protocol without IV contrast. COMPARISON:  11/06/2016  chest x-ray. 11/05/2016 and 06/07/2015 chest CT. FINDINGS: Cardiovascular: Pectus deformity with cardiomegaly. Coronary artery calcifications. Atherosclerotic changes thoracic aorta. Ascending thoracic aorta measures up to 4 Cm. Mediastinum/Nodes: Mediastinal adenopathy. Lungs/Pleura: Obstruction left upper lobe bronchus with postobstructive atelectasis/ infiltrate. Rounded fullness left hilar region raises possibly of obstructing mass (not well delineated on present noncontrast exam). The presence of adjacent mediastinal raises possibility of tumor. Atelectasis with bronchial thickening involving portions of the lower lobes bilaterally and medial aspect of the right middle lobe. Very small left-sided pleural effusion. Upper Abdomen: Slightly lobulated contour of the liver without other findings of cirrhosis. Mild nodularity left adrenal gland. Musculoskeletal: Sclerotic appearance left humeral head suggestive of avascular necrosis. Pectus deformity. Mild degenerative changes thoracic spine with destructive lesion. IMPRESSION: Obstruction left upper lobe bronchus with postobstructive atelectasis/ infiltrate. Rounded fullness left hilar region raises possibly of obstructing mass (not well delineated on present noncontrast exam). The presence of adjacent mediastinal raises possibility of tumor. Atelectasis with bronchial thickening involving portions of the lower lobes bilaterally and medial aspect of the right middle lobe. Very small left-sided pleural effusion. Mild nodularity left adrenal gland. Ascending thoracic aorta measures up to 4 cm. Recommend annual imaging followup by CTA or MRA. This recommendation follows 2010 ACCF/AHA/AATS/ACR/ASA/SCA/SCAI/SIR/STS/SVM Guidelines for the Diagnosis and Management of Patients with Thoracic Aortic Disease. Circulation. 2010; 121: X937-J696 Sclerotic appearance left humeral head suggestive of avascular necrosis.  Pectus deformity with mild cardiomegaly. Aortic Atherosclerosis (ICD10-I70.0). Electronically Signed   By: Genia Del M.D.   On: 11/07/2016 07:18   Ct Chest Wo Contrast  Result Date: 11/05/2016 CLINICAL DATA:  66 year old male with possible left hilar mass. EXAM: CT CHEST WITHOUT CONTRAST TECHNIQUE: Multidetector CT imaging of the chest was performed following the standard protocol without IV contrast. COMPARISON:  Chest radiograph dated 11/05/2016 FINDINGS: Evaluation of this exam is limited in the absence of intravenous contrast. Cardiovascular: There is mild cardiomegaly. No pericardial effusion. There is coronary vascular calcification primarily involving the LAD, left circumflex artery. There is mild atherosclerotic calcification of the thoracic aorta. Evaluation of the aorta and pulmonary arteries is limited in the absence of intravenous contrast. Mediastinum/Nodes: Multiple top-normal mediastinal lymph nodes noted. No definite adenopathy. Evaluation of the left hilum is limited due to consolidative changes of the adjacent lung. Esophagus is grossly unremarkable. No thyroid nodules noted. Lungs/Pleura: There is a small left pleural effusion. Left lower lobe consolidative changes may represent atelectasis versus infiltrate. There is consolidative changes of the left upper lobe and the lingula. There is abrupt termination of the left upper lobe bronchus. Findings may represent postobstructive atelectasis versus pneumonia. There is fullness of the left hilar soft tissue concerning for underlying mass. Evaluation of the hilum is limited on this noncontrast CT. Correlation with clinical exam and follow-up to resolution or further evaluation with CT with IV contrast recommended. There is background of emphysema. Right lung base atelectasis/scarring noted. Upper Abdomen: No acute abnormality. Musculoskeletal: There is pectus deformity.  No acute fracture. IMPRESSION: 1. Apparent abrupt termination of the left  upper lobe bronchus with large area consolidative changes in the left upper lobe and primarily lingula. Findings may represent postobstructive atelectasis versus pneumonia. There is fullness of the left hilar soft tissue concerning for a hilar mass. Evaluation is very limited on this noncontrast CT. Follow-up to resolution or further evaluation with CT with IV contrast recommended. 2. Small left pleural effusion and left lower lobe consolidative changes which may represent atelectasis versus infiltrate. 3. Mild cardiomegaly. 4. Multiple mildly  prominent mediastinal lymph nodes Electronically Signed   By: Anner Crete M.D.   On: 11/05/2016 02:25   Mr Jeri Cos GQ Contrast  Result Date: 11/29/2016 CLINICAL DATA:  Small cell lung cancer. Staging for metastatic disease. EXAM: MRI HEAD WITHOUT AND WITH CONTRAST TECHNIQUE: Multiplanar, multiecho pulse sequences of the brain and surrounding structures were obtained without and with intravenous contrast. CONTRAST:  57m MULTIHANCE GADOBENATE DIMEGLUMINE 529 MG/ML IV SOLN COMPARISON:  None. FINDINGS: Brain: 1 cm area of restricted diffusion right frontal white matter with mild patchy enhancement, most likely due to subacute infarct. No surrounding edema. Similar areas of restricted diffusion in the right parietal lobe also show mild enhancement. The enhancement pattern and streaky diffusion is most typical of subacute infarct rather than tumor. Given history of lung cancer, close follow-up is warranted. No other enhancing lesions are seen. Generalized atrophy. Moderate chronic microvascular ischemic change in the white matter and pons. Negative for intracranial hemorrhage Vascular: Normal arterial flow void Skull and upper cervical spine: Negative Sinuses/Orbits: Negative Other: None IMPRESSION: Cortical enhancing regions in the right frontal and right parietal lobe show restricted diffusion and are most likely related to subacute infarct. Given history of lung cancer,  short-term follow-up MRI in 4-6 weeks suggested to evaluate for evolutionary changes of infarct. Otherwise no evidence of metastatic disease Generalized atrophy with moderate chronic microvascular ischemia. These results will be called to the ordering clinician or representative by the Radiologist Assistant, and communication documented in the PACS or zVision Dashboard. Electronically Signed   By: CFranchot GalloM.D.   On: 11/29/2016 12:02   Dg Chest Port 1 View  Result Date: 11/06/2016 CLINICAL DATA:  Left-sided thoracentesis EXAM: PORTABLE CHEST 1 VIEW COMPARISON:  Yesterday FINDINGS: No visible residual fluid. No visible pneumothorax. There is a known left hilar mass with extensive upper lobe opacification. Cardiomegaly. Mild atelectasis at the medial right base. IMPRESSION: No acute finding after thoracentesis. Known left upper lobe mass and collapse. Electronically Signed   By: JMonte FantasiaM.D.   On: 11/06/2016 14:23      IMPRESSION/PLAN: 1. 66y.o. 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  We talked to the patient and his wife about the findings and work-up thus far. We discussed the natural history of small cell lung cancer and general treatment, highlighting the role of radiotherapy in the management.  We discussed the available radiation techniques, and focused on the details of logistics and delivery.  We reviewed the anticipated acute and late sequelae associated with radiation in this setting. We also discussed future plans for palliative whole brain radiation treatment as a preventative measure for metastasis after chemotherapy completion. The patient was encouraged to ask questions that we answered to the best of our ability. The patient is interested in proceeding with radiotherapy. He is scheduled for PET scan on 8/16. We will await PET scan results prior to initiating radiotherapy.  He  is scheduled to begin his second course of chemotherapy on 8/28. Therefore, he has been scheduled for CT simulation on Friday, September 7th at 8 am.  2. Bilateral lower extremity pitting edema. The patient is taking Lasix as managed by Dr. CLacinda Axon The patient is scheduled to see Dr. CLacinda Axonagain on Monday, 8/13 and has a scheduled cardiology evaluation on 12/22/16 with Dr. GRockey Situ 3. Tobacco abuse. The patient continues to smoke five cigarettes daily. Smoking cessation was discussed and encouraged.  Nicholos Johns, PA-C    Tyler Pita, MD  [facility] Direct Dial: [phone]  Fax: [phone] [email or web address]  Skype  [email or web address]  This document serves as a record of services personally performed by Tyler Pita, MD and Freeman Caldron, PA-C. It was created on their behalf by Arlyce Harman, a trained medical scribe. The creation of this record is based on the scribe's personal observations and the provider's statements to them. This document has been checked and approved by the attending provider.

## 2016-11-30 NOTE — Progress Notes (Signed)
See progress note under physician encounter. 

## 2016-11-30 NOTE — Progress Notes (Unsigned)
Around 1600 on 11/29/17--  Patient was waiting in the lobby to be registered. Due to Epic Downtime he did not get checked in. His wife asked for a nurse to see him. He was agitated and confused. The wife states that he was short of breath. He was found to have an O2 sat of 66% on 2.5L. We brought him back to infusion and per Md. Order, we started an IV and began running some IV fluids. On 5L of O2 he was saturating 95%. Patient becomes more alert and oriented. We escorted him to the emergency department and gave a brief report to [place], [place]. Dr. Julien Nordmann ordered for the chemo to not be given today and he plans to see the patient in the hospital.

## 2016-12-01 ENCOUNTER — Other Ambulatory Visit: Payer: Self-pay | Admitting: *Deleted

## 2016-12-01 ENCOUNTER — Ambulatory Visit (HOSPITAL_BASED_OUTPATIENT_CLINIC_OR_DEPARTMENT_OTHER): Payer: PPO

## 2016-12-01 VITALS — BP 114/69 | HR 85 | Temp 98.0°F | Resp 17

## 2016-12-01 DIAGNOSIS — Z5189 Encounter for other specified aftercare: Secondary | ICD-10-CM | POA: Diagnosis not present

## 2016-12-01 DIAGNOSIS — C3412 Malignant neoplasm of upper lobe, left bronchus or lung: Secondary | ICD-10-CM

## 2016-12-01 DIAGNOSIS — C3400 Malignant neoplasm of unspecified main bronchus: Secondary | ICD-10-CM

## 2016-12-01 DIAGNOSIS — Z5111 Encounter for antineoplastic chemotherapy: Secondary | ICD-10-CM

## 2016-12-01 MED ORDER — HYDROCODONE-ACETAMINOPHEN 10-325 MG PO TABS: 1.0000 | ORAL_TABLET | ORAL | 0 refills | Status: AC | PRN

## 2016-12-01 MED ORDER — DEXAMETHASONE SODIUM PHOSPHATE 10 MG/ML IJ SOLN
10.0000 mg | Freq: Once | INTRAMUSCULAR | Status: AC
  Administered 2016-12-01: 10 mg via INTRAVENOUS

## 2016-12-01 MED ORDER — DEXAMETHASONE SODIUM PHOSPHATE 10 MG/ML IJ SOLN
INTRAMUSCULAR | Status: AC
  Filled 2016-12-01: qty 1

## 2016-12-01 MED ORDER — SODIUM CHLORIDE 0.9 % IV SOLN
75.0000 mg/m2 | Freq: Once | INTRAVENOUS | Status: AC
  Administered 2016-12-01: 160 mg via INTRAVENOUS
  Filled 2016-12-01: qty 8

## 2016-12-01 MED ORDER — SODIUM CHLORIDE 0.9 % IV SOLN
Freq: Once | INTRAVENOUS | Status: AC
  Administered 2016-12-01: 08:00:00 via INTRAVENOUS

## 2016-12-01 MED ORDER — PEGFILGRASTIM 6 MG/0.6ML ~~LOC~~ PSKT
6.0000 mg | PREFILLED_SYRINGE | Freq: Once | SUBCUTANEOUS | Status: AC
  Administered 2016-12-01: 6 mg via SUBCUTANEOUS
  Filled 2016-12-01: qty 0.6

## 2016-12-01 NOTE — Patient Instructions (Addendum)
[facility] Discharge Instructions for Patients Receiving Chemotherapy  Today you received the following chemotherapy agents:  Etoposide.  To help prevent nausea and vomiting after your treatment, we encourage you to take your nausea medication as directed.   If you develop nausea and vomiting that is not controlled by your nausea medication, call the clinic.   BELOW ARE SYMPTOMS THAT SHOULD BE REPORTED IMMEDIATELY:  *FEVER GREATER THAN 100.5 F  *CHILLS WITH OR WITHOUT FEVER  NAUSEA AND VOMITING THAT IS NOT CONTROLLED WITH YOUR NAUSEA MEDICATION  *UNUSUAL SHORTNESS OF BREATH  *UNUSUAL BRUISING OR BLEEDING  TENDERNESS IN MOUTH AND THROAT WITH OR WITHOUT PRESENCE OF ULCERS  *URINARY PROBLEMS  *BOWEL PROBLEMS  UNUSUAL RASH Items with * indicate a potential emergency and should be followed up as soon as possible.  Feel free to call the clinic you have any questions or concerns. The clinic phone number is [phone].  Please show the [facility] at check-in to the Emergency Department and triage nurse.  Pegfilgrastim injection (Neulasta) What is this medicine? PEGFILGRASTIM (PEG fil gra stim) is a long-acting granulocyte colony-stimulating factor that stimulates the growth of neutrophils, a type of white blood cell important in the body's fight against infection. It is used to reduce the incidence of fever and infection in patients with certain types of cancer who are receiving chemotherapy that affects the bone marrow, and to increase survival after being exposed to high doses of radiation. This medicine may be used for other purposes; ask your health care provider or pharmacist if you have questions. COMMON BRAND NAME(S): Neulasta What should I tell my health care provider before I take this medicine? They need to know if you have any of these conditions: -kidney disease -latex allergy -ongoing radiation therapy -sickle cell disease -skin reactions to  acrylic adhesives (On-Body Injector only) -an unusual or allergic reaction to pegfilgrastim, filgrastim, other medicines, foods, dyes, or preservatives -pregnant or trying to get pregnant -breast-feeding How should I use this medicine? This medicine is for injection under the skin. If you get this medicine at home, you will be taught how to prepare and give the pre-filled syringe or how to use the On-body Injector. Refer to the patient Instructions for Use for detailed instructions. Use exactly as directed. Tell your healthcare provider immediately if you suspect that the On-body Injector may not have performed as intended or if you suspect the use of the On-body Injector resulted in a missed or partial dose. It is important that you put your used needles and syringes in a special sharps container. Do not put them in a trash can. If you do not have a sharps container, call your pharmacist or healthcare provider to get one. Talk to your pediatrician regarding the use of this medicine in children. While this drug may be prescribed for selected conditions, precautions do apply. Overdosage: If you think you have taken too much of this medicine contact a poison control center or emergency room at once. NOTE: This medicine is only for you. Do not share this medicine with others. What if I miss a dose? It is important not to miss your dose. Call your doctor or health care professional if you miss your dose. If you miss a dose due to an On-body Injector failure or leakage, a new dose should be administered as soon as possible using a single prefilled syringe for manual use. What may interact with this medicine? Interactions have not been studied. Give your  health care provider a list of all the medicines, herbs, non-prescription drugs, or dietary supplements you use. Also tell them if you smoke, drink alcohol, or use illegal drugs. Some items may interact with your medicine. This list may not describe all  possible interactions. Give your health care provider a list of all the medicines, herbs, non-prescription drugs, or dietary supplements you use. Also tell them if you smoke, drink alcohol, or use illegal drugs. Some items may interact with your medicine. What should I watch for while using this medicine? You may need blood work done while you are taking this medicine. If you are going to need a MRI, CT scan, or other procedure, tell your doctor that you are using this medicine (On-Body Injector only). What side effects may I notice from receiving this medicine? Side effects that you should report to your doctor or health care professional as soon as possible: -allergic reactions like skin rash, itching or hives, swelling of the face, lips, or tongue -dizziness -fever -pain, redness, or irritation at site where injected -pinpoint red spots on the skin -red or dark-brown urine -shortness of breath or breathing problems -stomach or side pain, or pain at the shoulder -swelling -tiredness -trouble passing urine or change in the amount of urine Side effects that usually do not require medical attention (report to your doctor or health care professional if they continue or are bothersome): -bone pain -muscle pain This list may not describe all possible side effects. Call your doctor for medical advice about side effects. You may report side effects to FDA at 1-800-FDA-1088. Where should I keep my medicine? Keep out of the reach of children. Store pre-filled syringes in a refrigerator between 2 and 8 degrees C (36 and 46 degrees F). Do not freeze. Keep in carton to protect from light. Throw away this medicine if it is left out of the refrigerator for more than 48 hours. Throw away any unused medicine after the expiration date. NOTE: This sheet is a summary. It may not cover all possible information. If you have questions about this medicine, talk to your doctor, pharmacist, or health care  provider.  2018 Elsevier/Gold Standard (2016-04-06 12:58:03)

## 2016-12-02 ENCOUNTER — Ambulatory Visit: Payer: PPO

## 2016-12-03 ENCOUNTER — Emergency Department (HOSPITAL_COMMUNITY): Payer: PPO

## 2016-12-03 ENCOUNTER — Inpatient Hospital Stay (HOSPITAL_COMMUNITY)
Admission: EM | Admit: 2016-12-03 | Discharge: 2016-12-09 | DRG: 280 | Disposition: A | Discharge status: Home / Self Care | Payer: PPO | Attending: Internal Medicine | Admitting: Internal Medicine

## 2016-12-03 ENCOUNTER — Other Ambulatory Visit: Payer: Self-pay

## 2016-12-03 ENCOUNTER — Encounter (HOSPITAL_COMMUNITY): Payer: Self-pay

## 2016-12-03 DIAGNOSIS — D6959 Other secondary thrombocytopenia: Secondary | ICD-10-CM | POA: Diagnosis not present

## 2016-12-03 DIAGNOSIS — N183 Chronic kidney disease, stage 3 unspecified: Secondary | ICD-10-CM | POA: Diagnosis present

## 2016-12-03 DIAGNOSIS — F17210 Nicotine dependence, cigarettes, uncomplicated: Secondary | ICD-10-CM | POA: Diagnosis present

## 2016-12-03 DIAGNOSIS — Z8249 Family history of ischemic heart disease and other diseases of the circulatory system: Secondary | ICD-10-CM

## 2016-12-03 DIAGNOSIS — A419 Sepsis, unspecified organism: Secondary | ICD-10-CM | POA: Diagnosis not present

## 2016-12-03 DIAGNOSIS — N200 Calculus of kidney: Secondary | ICD-10-CM | POA: Diagnosis present

## 2016-12-03 DIAGNOSIS — R21 Rash and other nonspecific skin eruption: Secondary | ICD-10-CM | POA: Diagnosis not present

## 2016-12-03 DIAGNOSIS — K1230 Oral mucositis (ulcerative), unspecified: Secondary | ICD-10-CM | POA: Diagnosis not present

## 2016-12-03 DIAGNOSIS — D696 Thrombocytopenia, unspecified: Secondary | ICD-10-CM | POA: Diagnosis present

## 2016-12-03 DIAGNOSIS — E1122 Type 2 diabetes mellitus with diabetic chronic kidney disease: Secondary | ICD-10-CM | POA: Diagnosis present

## 2016-12-03 DIAGNOSIS — I10 Essential (primary) hypertension: Secondary | ICD-10-CM | POA: Diagnosis present

## 2016-12-03 DIAGNOSIS — Z7982 Long term (current) use of aspirin: Secondary | ICD-10-CM

## 2016-12-03 DIAGNOSIS — N179 Acute kidney failure, unspecified: Secondary | ICD-10-CM | POA: Diagnosis present

## 2016-12-03 DIAGNOSIS — Z993 Dependence on wheelchair: Secondary | ICD-10-CM

## 2016-12-03 DIAGNOSIS — E119 Type 2 diabetes mellitus without complications: Secondary | ICD-10-CM

## 2016-12-03 DIAGNOSIS — R1310 Dysphagia, unspecified: Secondary | ICD-10-CM | POA: Diagnosis not present

## 2016-12-03 DIAGNOSIS — Z66 Do not resuscitate: Secondary | ICD-10-CM | POA: Diagnosis not present

## 2016-12-03 DIAGNOSIS — R0602 Shortness of breath: Secondary | ICD-10-CM | POA: Diagnosis not present

## 2016-12-03 DIAGNOSIS — D649 Anemia, unspecified: Secondary | ICD-10-CM | POA: Diagnosis not present

## 2016-12-03 DIAGNOSIS — I959 Hypotension, unspecified: Secondary | ICD-10-CM | POA: Diagnosis present

## 2016-12-03 DIAGNOSIS — B370 Candidal stomatitis: Secondary | ICD-10-CM | POA: Diagnosis not present

## 2016-12-03 DIAGNOSIS — I21A1 Myocardial infarction type 2: Secondary | ICD-10-CM | POA: Diagnosis present

## 2016-12-03 DIAGNOSIS — K219 Gastro-esophageal reflux disease without esophagitis: Secondary | ICD-10-CM | POA: Diagnosis present

## 2016-12-03 DIAGNOSIS — K761 Chronic passive congestion of liver: Secondary | ICD-10-CM | POA: Diagnosis not present

## 2016-12-03 DIAGNOSIS — T451X5A Adverse effect of antineoplastic and immunosuppressive drugs, initial encounter: Secondary | ICD-10-CM | POA: Diagnosis present

## 2016-12-03 DIAGNOSIS — J449 Chronic obstructive pulmonary disease, unspecified: Secondary | ICD-10-CM | POA: Diagnosis present

## 2016-12-03 DIAGNOSIS — R609 Edema, unspecified: Secondary | ICD-10-CM | POA: Diagnosis not present

## 2016-12-03 DIAGNOSIS — Q631 Lobulated, fused and horseshoe kidney: Secondary | ICD-10-CM

## 2016-12-03 DIAGNOSIS — E1165 Type 2 diabetes mellitus with hyperglycemia: Secondary | ICD-10-CM | POA: Diagnosis not present

## 2016-12-03 DIAGNOSIS — Z8711 Personal history of peptic ulcer disease: Secondary | ICD-10-CM

## 2016-12-03 DIAGNOSIS — Z9981 Dependence on supplemental oxygen: Secondary | ICD-10-CM

## 2016-12-03 DIAGNOSIS — F419 Anxiety disorder, unspecified: Secondary | ICD-10-CM | POA: Diagnosis not present

## 2016-12-03 DIAGNOSIS — E1159 Type 2 diabetes mellitus with other circulatory complications: Secondary | ICD-10-CM

## 2016-12-03 DIAGNOSIS — D709 Neutropenia, unspecified: Secondary | ICD-10-CM | POA: Diagnosis present

## 2016-12-03 DIAGNOSIS — C3412 Malignant neoplasm of upper lobe, left bronchus or lung: Secondary | ICD-10-CM | POA: Diagnosis not present

## 2016-12-03 DIAGNOSIS — E785 Hyperlipidemia, unspecified: Secondary | ICD-10-CM | POA: Diagnosis present

## 2016-12-03 DIAGNOSIS — Z801 Family history of malignant neoplasm of trachea, bronchus and lung: Secondary | ICD-10-CM

## 2016-12-03 DIAGNOSIS — I130 Hypertensive heart and chronic kidney disease with heart failure and stage 1 through stage 4 chronic kidney disease, or unspecified chronic kidney disease: Principal | ICD-10-CM | POA: Diagnosis present

## 2016-12-03 DIAGNOSIS — J9621 Acute and chronic respiratory failure with hypoxia: Secondary | ICD-10-CM | POA: Diagnosis not present

## 2016-12-03 DIAGNOSIS — I5043 Acute on chronic combined systolic (congestive) and diastolic (congestive) heart failure: Secondary | ICD-10-CM | POA: Diagnosis not present

## 2016-12-03 DIAGNOSIS — R7989 Other specified abnormal findings of blood chemistry: Secondary | ICD-10-CM | POA: Diagnosis present

## 2016-12-03 DIAGNOSIS — Z7984 Long term (current) use of oral hypoglycemic drugs: Secondary | ICD-10-CM

## 2016-12-03 DIAGNOSIS — R945 Abnormal results of liver function studies: Secondary | ICD-10-CM | POA: Diagnosis not present

## 2016-12-03 DIAGNOSIS — Z85828 Personal history of other malignant neoplasm of skin: Secondary | ICD-10-CM

## 2016-12-03 DIAGNOSIS — Z79899 Other long term (current) drug therapy: Secondary | ICD-10-CM

## 2016-12-03 DIAGNOSIS — T380X5A Adverse effect of glucocorticoids and synthetic analogues, initial encounter: Secondary | ICD-10-CM | POA: Diagnosis not present

## 2016-12-03 DIAGNOSIS — R05 Cough: Secondary | ICD-10-CM | POA: Diagnosis not present

## 2016-12-03 DIAGNOSIS — I2510 Atherosclerotic heart disease of native coronary artery without angina pectoris: Secondary | ICD-10-CM | POA: Diagnosis not present

## 2016-12-03 DIAGNOSIS — I509 Heart failure, unspecified: Secondary | ICD-10-CM | POA: Diagnosis not present

## 2016-12-03 DIAGNOSIS — I25708 Atherosclerosis of coronary artery bypass graft(s), unspecified, with other forms of angina pectoris: Secondary | ICD-10-CM | POA: Diagnosis not present

## 2016-12-03 DIAGNOSIS — R748 Abnormal levels of other serum enzymes: Secondary | ICD-10-CM | POA: Diagnosis not present

## 2016-12-03 DIAGNOSIS — I471 Supraventricular tachycardia: Secondary | ICD-10-CM | POA: Diagnosis not present

## 2016-12-03 DIAGNOSIS — I5022 Chronic systolic (congestive) heart failure: Secondary | ICD-10-CM

## 2016-12-03 DIAGNOSIS — R0609 Other forms of dyspnea: Secondary | ICD-10-CM | POA: Diagnosis not present

## 2016-12-03 DIAGNOSIS — R778 Other specified abnormalities of plasma proteins: Secondary | ICD-10-CM

## 2016-12-03 LAB — I-STAT CG4 LACTIC ACID, ED: Lactic Acid, Venous: 1.66 mmol/L (ref 0.5–1.9)

## 2016-12-03 LAB — COMPREHENSIVE METABOLIC PANEL
ALBUMIN: 2.5 g/dL — AB (ref 3.5–5.0)
ALK PHOS: 176 U/L — AB (ref 38–126)
ALT: 100 U/L — AB (ref 17–63)
AST: 207 U/L — AB (ref 15–41)
Anion gap: 12 (ref 5–15)
BILIRUBIN TOTAL: 5.7 mg/dL — AB (ref 0.3–1.2)
BUN: 58 mg/dL — AB (ref 6–20)
CALCIUM: 8.3 mg/dL — AB (ref 8.9–10.3)
CO2: 25 mmol/L (ref 22–32)
CREATININE: 1.8 mg/dL — AB (ref 0.61–1.24)
Chloride: 100 mmol/L — ABNORMAL LOW (ref 101–111)
GFR calc Af Amer: 43 mL/min — ABNORMAL LOW (ref >60)
GFR calc non Af Amer: 37 mL/min — ABNORMAL LOW (ref >60)
GLUCOSE: 206 mg/dL — AB (ref 65–99)
POTASSIUM: 4.4 mmol/L (ref 3.5–5.1)
Sodium: 137 mmol/L (ref 135–145)
TOTAL PROTEIN: 6 g/dL — AB (ref 6.5–8.1)

## 2016-12-03 LAB — URINALYSIS, ROUTINE W REFLEX MICROSCOPIC
Bilirubin Urine: NEGATIVE
GLUCOSE, UA: NEGATIVE mg/dL
Ketones, ur: NEGATIVE mg/dL
NITRITE: NEGATIVE
Protein, ur: 30 mg/dL — AB
Specific Gravity, Urine: 1.014 (ref 1.005–1.030)
pH: 5 (ref 5.0–8.0)

## 2016-12-03 LAB — CBC WITH DIFFERENTIAL/PLATELET
BASOS ABS: 0 10*3/uL (ref 0.0–0.1)
Basophils Relative: 0 %
Eosinophils Absolute: 0 10*3/uL (ref 0.0–0.7)
Eosinophils Relative: 0 %
HEMATOCRIT: 38.7 % — AB (ref 39.0–52.0)
Hemoglobin: 12.7 g/dL — ABNORMAL LOW (ref 13.0–17.0)
LYMPHS ABS: 0.4 10*3/uL — AB (ref 0.7–4.0)
Lymphocytes Relative: 4 %
MCH: 31.1 pg (ref 26.0–34.0)
MCHC: 32.8 g/dL (ref 30.0–36.0)
MCV: 94.9 fL (ref 78.0–100.0)
MONO ABS: 0.1 10*3/uL (ref 0.1–1.0)
MONOS PCT: 1 %
NEUTROS ABS: 10.1 10*3/uL — AB (ref 1.7–7.7)
Neutrophils Relative %: 95 %
Platelets: 79 10*3/uL — ABNORMAL LOW (ref 150–400)
RBC: 4.08 MIL/uL — ABNORMAL LOW (ref 4.22–5.81)
RDW: 17.7 % — AB (ref 11.5–15.5)
WBC: 10.6 10*3/uL — ABNORMAL HIGH (ref 4.0–10.5)

## 2016-12-03 LAB — TROPONIN I: Troponin I: 0.76 ng/mL (ref <0.03)

## 2016-12-03 LAB — BRAIN NATRIURETIC PEPTIDE: B Natriuretic Peptide: 435.1 pg/mL — ABNORMAL HIGH (ref 0.0–100.0)

## 2016-12-03 MED ORDER — IPRATROPIUM-ALBUTEROL 0.5-2.5 (3) MG/3ML IN SOLN
3.0000 mL | Freq: Once | RESPIRATORY_TRACT | Status: AC
  Administered 2016-12-03: 3 mL via RESPIRATORY_TRACT
  Filled 2016-12-03: qty 3

## 2016-12-03 MED ORDER — FUROSEMIDE 10 MG/ML IJ SOLN
40.0000 mg | Freq: Two times a day (BID) | INTRAMUSCULAR | Status: DC
  Administered 2016-12-03 – 2016-12-04 (×3): 40 mg via INTRAVENOUS
  Filled 2016-12-03 (×3): qty 4

## 2016-12-03 NOTE — ED Triage Notes (Signed)
Pt BIB from home c/o hypotension at home (70s/40s). 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 Pt wife has had to keep him on 4L (he is usually at 2.5L) to maintain sats at 90%. Last chemo in Friday (8/10). Pt wife states that his leg edema is sperading up to his abdomen and concerning her. A&Ox4. Pt took a 10/325 Norco at 1p.

## 2016-12-03 NOTE — ED Notes (Signed)
Call report to [facility], RN at 23:25, [phone]

## 2016-12-03 NOTE — ED Provider Notes (Signed)
[facility] DEPT Provider Note   CSN: [id number] Arrival date & time: 12/03/16  1818     History   Chief Complaint Chief Complaint  Patient presents with  . Hypotension    cancer pt  . Edema    HPI Kevin Gutierrez is a 66 y.o. 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 Patient's wife states that his bilateral lower extremity edema significantly worsened earlier today and appears to extend to his abdomen. Patient notes chest pain on deep inspiration and with cough. Denies hemoptysis. No fevers or chills. He endorses orthopnea and worsening back pain and chest pain on laying supine. Patient's wife states that throughout the day he has been persistently hypotensive and hypoxic, and prior to arrival to the ED he was on 4 L via [place] with SPO2 88%. He is typically on 2.5 L via nasal cannula. He is also complaining of a rash to the right groin which appears to be progressively worsening. Patient wife states that he typically gets "heat rash "to this area which is what the rash initially looked like but states it is worsening. Rash is nontender and nonpruritic. Dr. Earlie Server is his oncologist and last infusion was 3 days ago. He ambulance with a wheelchair.  The history is provided by the patient and the spouse.    Past Medical History:  Diagnosis Date  . Chicken pox   . COPD (chronic obstructive pulmonary disease) ([place])   . History of kidney stones 05/17/2015  . History of stomach ulcers 05/17/2015  . Hypertension   . Kidney stones   . Lung cancer ([place])   . Nephrolithiasis   . Peptic ulcer disease   . Vitamin D deficiency     Patient Active Problem List   Diagnosis Date Noted  . Acute on chronic combined systolic and diastolic CHF (congestive heart failure) ([place]) 12/03/2016  .  Elevated LFTs 12/03/2016  . Elevated troponin 12/03/2016  . Thrombocytopenia ([place]) 12/03/2016  . Encounter for antineoplastic chemotherapy 11/15/2016  . Goals of care, counseling/discussion 11/15/2016  . Type 2 diabetes mellitus ([place]) 11/11/2016  . Small cell lung cancer, left upper lobe ([place])   . Atelectasis   . Chronic CHF (congestive heart failure) ([place])   . Coronary artery disease   . Chronic respiratory failure ([place]) 06/13/2016  . Anxiety state 04/05/2016  . DDD (degenerative disc disease), lumbar 03/14/2016  . Vitamin D deficiency 02/22/2016  . Hyperlipidemia 11/15/2015  . Basal cell carcinoma 11/15/2015  . CKD (chronic kidney disease) stage 3, GFR 30-59 ml/min 05/19/2015  . AKI (acute kidney injury) ([place]) 05/17/2015  . Insomnia 05/17/2015  . Essential hypertension 05/05/2015  . Tobacco abuse 05/05/2015    Past Surgical History:  Procedure Laterality Date  . LEFT HEART CATH AND CORONARY ANGIOGRAPHY N/A 11/07/2016   Procedure: Left Heart Cath and Coronary Angiography;  Surgeon: Troy Sine, MD;  Location: [facility];  Service: Cardiovascular;  Laterality: N/A;  . LITHOTRIPSY    . SPINAL CORD DECOMPRESSION  03/08/1994  . VIDEO BRONCHOSCOPY N/A 11/08/2016   Procedure: VIDEO BRONCHOSCOPY;  Surgeon: Collene Gobble, MD;  Location: [facility];  Service: Cardiopulmonary;  Laterality: N/A;       Home Medications    Prior to Admission medications   Medication Sig Start Date End Date Taking? Authorizing Provider  albuterol (PROVENTIL) (2.5 MG/3ML) 0.083% nebulizer solution Take 3 mLs (2.5 mg total) by nebulization every 3 (three) hours as needed for wheezing or shortness of breath. 08/04/16  Yes Cook, Jayce G, DO  albuterol (VENTOLIN HFA) 108 (90 Base) MCG/ACT inhaler Inhale 2 puffs into the lungs every 4 (four) hours as needed for wheezing or shortness of breath. 07/18/16  Yes Flora Lipps, MD  aspirin EC 81 MG tablet Take 1 tablet (81 mg total) by mouth daily. 05/17/15  Yes  Cook, Jayce G, DO  atorvastatin (LIPITOR) 20 MG tablet Take 1 tablet (20 mg total) by mouth daily. 08/04/16  Yes Cook, Jayce G, DO  blood glucose meter kit and supplies Dispense based on patient and insurance preference. Use up to four times daily as directed. (FOR ICD-10 E11.9). 11/11/16  Yes Johnson, Clanford L, MD  clonazePAM (KLONOPIN) 0.5 MG tablet Take 1 tablet (0.5 mg total) by mouth 2 (two) times daily as needed for anxiety. 11/02/16  Yes Cook, Jayce G, DO  furosemide (LASIX) 40 MG tablet Take 1 tablet (40 mg total) by mouth daily. 11/20/16  Yes Cook, Jayce G, DO  glipiZIDE (GLUCOTROL XL) 5 MG 24 hr tablet Take 1 tablet (5 mg total) by mouth daily with breakfast. 11/20/16  Yes Cook, Jayce G, DO  HYDROcodone-acetaminophen (NORCO) 10-325 MG tablet Take 1 tablet by mouth every 4 (four) hours as needed for severe pain (may take 1 tablet every 4-6 hours prn pain). 12/01/16  Yes Curt Bears, MD  isosorbide-hydrALAZINE (BIDIL) 20-37.5 MG tablet Take 1 tablet by mouth 3 (three) times daily. 11/11/16 12/11/16 Yes Johnson, Clanford L, MD  metoprolol tartrate (LOPRESSOR) 25 MG tablet Take 1 tablet (25 mg total) by mouth 2 (two) times daily. 11/11/16 12/11/16 Yes Johnson, Clanford L, MD  nystatin (MYCOSTATIN) 100000 UNIT/ML suspension TAKE  5 ML BY MOUTH 4 TIMES DAILY 11/14/16  Yes Kasa, Maretta Bees, MD  pantoprazole (PROTONIX) 40 MG tablet Take 1 tablet (40 mg total) by mouth daily. 08/04/16  Yes Cook, Jayce G, DO  prochlorperazine (COMPAZINE) 10 MG tablet Take 1 tablet (10 mg total) by mouth every 6 (six) hours as needed for nausea or vomiting. 11/15/16  Yes Curt Bears, MD  tiotropium (SPIRIVA) 18 MCG inhalation capsule Place 1 capsule (18 mcg total) into inhaler and inhale daily. 08/30/16  Yes Kasa, Maretta Bees, MD  Fluticasone-Umeclidin-Vilant (TRELEGY ELLIPTA) 100-62.5-25 MCG/INH AEPB Inhale 1 Dose into the lungs daily. Patient not taking: Reported on 11/29/2016 11/20/16   Coral Spikes, DO    Family  History Family History  Problem Relation Age of Onset  . Diabetes Mellitus II Brother   . Lung cancer Brother   . Heart disease Brother        CABG in his 32's  . Hyperlipidemia Brother   . Heart disease Mother        MI in her 60's  . Stroke Father   . Hypertension Brother     Social History Social History  Substance Use Topics  . Smoking status: Current Every Day Smoker    Packs/day: 0.50    Years: 50.00    Types: Cigarettes  . Smokeless tobacco: Never Used     Comment: Currently smoking 5 cigarettes daily  . Alcohol use No     Allergies   No known allergies   Review of Systems Review of Systems  Constitutional: Positive for fatigue. Negative for chills and fever.  Respiratory: Positive for cough and shortness of breath.   Cardiovascular: Positive for  chest pain and leg swelling.  Gastrointestinal: Negative for abdominal pain, blood in stool, nausea and vomiting.  Genitourinary: Negative for difficulty urinating and hematuria.  Musculoskeletal: Positive for back pain.  Skin: Positive for rash.  Allergic/Immunologic: Positive for immunocompromised state.  Neurological: Negative for syncope.  All other systems reviewed and are negative.    Physical Exam Updated Vital Signs BP 131/70 (BP Location: Left Arm)   Pulse 93   Temp 97.7 F (36.5 C) (Oral)   Resp 18   Wt 95 kg (209 lb 7 oz)   SpO2 91%   BMI 28.40 kg/m   Physical Exam  Constitutional: He is oriented to person, place, and time. He appears well-developed and well-nourished. No distress.  HENT:  Head: Normocephalic and atraumatic.  Right Ear: External ear normal.  Left Ear: External ear normal.  Eyes: Pupils are equal, round, and reactive to light. Conjunctivae are normal. Right eye exhibits no discharge. Left eye exhibits no discharge.  Neck: Normal range of motion. Neck supple. No JVD present. No tracheal deviation present.  Cardiovascular: Normal rate, regular rhythm, normal heart sounds and  intact distal pulses.   2+ DP/PT pulses bilaterally, Homans absent bilaterally. 4+ pitting edema to the bilateral lower extremities extending up to the thighs  Pulmonary/Chest: He has wheezes. He has rales. He exhibits no tenderness.  No intercostal retractions noted. Speaks in short sentences. Diffuse expiratory wheezing in the posterior lung fields. Bibasilar rails noted  Abdominal: Soft. Bowel sounds are normal. He exhibits distension. There is no tenderness.  Musculoskeletal: He exhibits no edema or tenderness.  No midline spine TTP, no paraspinal muscle tenderness, no deformity, crepitus, or step-off noted   Neurological: He is alert and oriented to person, place, and time.  Fluent speech, no facial droop  Skin: Skin is warm and dry. Rash noted. No erythema.  Right groin with erythematous rash, nontender to palpation. Skin appears slightly macerated in the groin  Psychiatric: He has a normal mood and affect. His behavior is normal.  Nursing note and vitals reviewed.    ED Treatments / Results  Labs (all labs ordered are listed, but only abnormal results are displayed) Labs Reviewed  COMPREHENSIVE METABOLIC PANEL - Abnormal; Notable for the following:       Result Value   Chloride 100 (*)    Glucose, Bld 206 (*)    BUN 58 (*)    Creatinine, Ser 1.80 (*)    Calcium 8.3 (*)    Total Protein 6.0 (*)    Albumin 2.5 (*)    AST 207 (*)    ALT 100 (*)    Alkaline Phosphatase 176 (*)    Total Bilirubin 5.7 (*)    GFR calc non Af Amer 37 (*)    GFR calc Af Amer 43 (*)    All other components within normal limits  CBC WITH DIFFERENTIAL/PLATELET - Abnormal; Notable for the following:    WBC 10.6 (*)    RBC 4.08 (*)    Hemoglobin 12.7 (*)    HCT 38.7 (*)    RDW 17.7 (*)    Platelets 79 (*)    Neutro Abs 10.1 (*)    Lymphs Abs 0.4 (*)    All other components within normal limits  URINALYSIS, ROUTINE W REFLEX MICROSCOPIC - Abnormal; Notable for the following:    Color, Urine  AMBER (*)    APPearance CLOUDY (*)    Hgb urine dipstick LARGE (*)    Protein, ur 30 (*)  Leukocytes, UA TRACE (*)    Bacteria, UA RARE (*)    Squamous Epithelial / LPF 0-5 (*)    All other components within normal limits  BRAIN NATRIURETIC PEPTIDE - Abnormal; Notable for the following:    B Natriuretic Peptide 435.1 (*)    All other components within normal limits  TROPONIN I - Abnormal; Notable for the following:    Troponin I 0.76 (*)    All other components within normal limits  URINE CULTURE  I-STAT CG4 LACTIC ACID, ED    EKG  EKG Interpretation None       Radiology Dg Chest 2 View  Result Date: 12/03/2016 CLINICAL DATA:  66 year old male recently diagnosis small cell lung cancer. Shortness of breath with cough and congestion for the past 3-4 weeks. EXAM: CHEST  2 VIEW COMPARISON:  Chest x-ray 11/29/2016. FINDINGS: Chronic volume loss and consolidation in the left upper lobe again noted. Mass-like area in the left hilar region, corresponding to known lymphadenopathy and left upper low mass. Right lung is clear. Small left pleural effusion. No evidence of pulmonary edema. Heart size is normal. Aortic atherosclerosis. IMPRESSION: 1. No acute radiographic abnormality of the chest. The appearance of the chest is very similar to prior studies, with known left upper lobe mass, left hilar lymphadenopathy and extensive airspace consolidation and volume loss in the left upper lobe, presumably related to central obstruction. 2. Aortic atherosclerosis. Electronically Signed   By: Vinnie Langton M.D.   On: 12/03/2016 21:11    Procedures Procedures (including critical care time)  Medications Ordered in ED Medications  furosemide (LASIX) injection 40 mg (not administered)  ipratropium-albuterol (DUONEB) 0.5-2.5 (3) MG/3ML nebulizer solution 3 mL (3 mLs Nebulization Given 12/03/16 2052)     Initial Impression / Assessment and Plan / ED Course  I have reviewed the triage vital signs  and the nursing notes.  Pertinent labs & imaging results that were available during my care of the patient were reviewed by me and considered in my medical decision making (see chart for details).     Patient with small cell lung cancer and COPD presents with acute onset of peripheral edema, hypotension, and shortness of breath. He is afebrile, normotensive and SPO2 greater than 90% on room air while on 4 L via nasal cannula (increased from his home O2 at 2.5L). Chest x-ray shows no acute abnormalities. However he does have an elevated BNP of 435 and elevated troponin of 0.76. Also appears to have a care with acute bump in creatinine to 1.80. Administered breathing treatment while in the ED due to wheezing on auscultation of the lungs with mild improvement. Spoke with hospitalist Dr. Loleta Books who agrees to assume care of the patient and bring him into the hospital for further management of his current condition. Patient seen and evaluated by Dr. Zenia Resides, who agrees with assessment and plan. Final Clinical Impressions(s) / ED Diagnoses   Final diagnoses:  Peripheral edema  AKI (acute kidney injury) ([place])  Elevated troponin    New Prescriptions New Prescriptions   No medications on file     Renita Papa, PA-C 12/03/16 2305

## 2016-12-03 NOTE — ED Notes (Signed)
Patient refuses an IV or to be stuck for any more blood.  MD Zenia Resides aware.

## 2016-12-04 ENCOUNTER — Encounter (HOSPITAL_COMMUNITY): Payer: Self-pay

## 2016-12-04 ENCOUNTER — Ambulatory Visit: Payer: PPO | Admitting: Family Medicine

## 2016-12-04 DIAGNOSIS — I2510 Atherosclerotic heart disease of native coronary artery without angina pectoris: Secondary | ICD-10-CM

## 2016-12-04 DIAGNOSIS — N179 Acute kidney failure, unspecified: Secondary | ICD-10-CM

## 2016-12-04 DIAGNOSIS — R7989 Other specified abnormal findings of blood chemistry: Secondary | ICD-10-CM

## 2016-12-04 DIAGNOSIS — I5043 Acute on chronic combined systolic (congestive) and diastolic (congestive) heart failure: Secondary | ICD-10-CM

## 2016-12-04 DIAGNOSIS — I25708 Atherosclerosis of coronary artery bypass graft(s), unspecified, with other forms of angina pectoris: Secondary | ICD-10-CM

## 2016-12-04 DIAGNOSIS — R609 Edema, unspecified: Secondary | ICD-10-CM

## 2016-12-04 DIAGNOSIS — R748 Abnormal levels of other serum enzymes: Secondary | ICD-10-CM

## 2016-12-04 LAB — BASIC METABOLIC PANEL
Anion gap: 12 (ref 5–15)
BUN: 60 mg/dL — ABNORMAL HIGH (ref 6–20)
CALCIUM: 8.3 mg/dL — AB (ref 8.9–10.3)
CHLORIDE: 102 mmol/L (ref 101–111)
CO2: 26 mmol/L (ref 22–32)
CREATININE: 1.9 mg/dL — AB (ref 0.61–1.24)
GFR calc Af Amer: 40 mL/min — ABNORMAL LOW (ref >60)
GFR, EST NON AFRICAN AMERICAN: 35 mL/min — AB (ref >60)
Glucose, Bld: 231 mg/dL — ABNORMAL HIGH (ref 65–99)
Potassium: 4.2 mmol/L (ref 3.5–5.1)
SODIUM: 140 mmol/L (ref 135–145)

## 2016-12-04 LAB — GLUCOSE, CAPILLARY
GLUCOSE-CAPILLARY: 139 mg/dL — AB (ref 65–99)
GLUCOSE-CAPILLARY: 159 mg/dL — AB (ref 65–99)
GLUCOSE-CAPILLARY: 159 mg/dL — AB (ref 65–99)
GLUCOSE-CAPILLARY: 170 mg/dL — AB (ref 65–99)
GLUCOSE-CAPILLARY: 230 mg/dL — AB (ref 65–99)

## 2016-12-04 LAB — MRSA PCR SCREENING: MRSA BY PCR: POSITIVE — AB

## 2016-12-04 LAB — CBC
HCT: 37 % — ABNORMAL LOW (ref 39.0–52.0)
Hemoglobin: 12.4 g/dL — ABNORMAL LOW (ref 13.0–17.0)
MCH: 31.2 pg (ref 26.0–34.0)
MCHC: 33.5 g/dL (ref 30.0–36.0)
MCV: 93 fL (ref 78.0–100.0)
PLATELETS: 69 10*3/uL — AB (ref 150–400)
RBC: 3.98 MIL/uL — ABNORMAL LOW (ref 4.22–5.81)
RDW: 17.4 % — AB (ref 11.5–15.5)
WBC: 7.1 10*3/uL (ref 4.0–10.5)

## 2016-12-04 LAB — TROPONIN I: TROPONIN I: 0.95 ng/mL — AB (ref <0.03)

## 2016-12-04 MED ORDER — INSULIN ASPART 100 UNIT/ML ~~LOC~~ SOLN
0.0000 [IU] | Freq: Every day | SUBCUTANEOUS | Status: DC
  Administered 2016-12-05: 2 [IU] via SUBCUTANEOUS

## 2016-12-04 MED ORDER — CHLORHEXIDINE GLUCONATE CLOTH 2 % EX PADS
6.0000 | MEDICATED_PAD | Freq: Every day | CUTANEOUS | Status: AC
  Administered 2016-12-04 – 2016-12-07 (×4): 6 via TOPICAL

## 2016-12-04 MED ORDER — ASPIRIN EC 81 MG PO TBEC
81.0000 mg | DELAYED_RELEASE_TABLET | Freq: Every day | ORAL | Status: DC
  Administered 2016-12-04 – 2016-12-09 (×5): 81 mg via ORAL
  Filled 2016-12-04 (×6): qty 1

## 2016-12-04 MED ORDER — ALBUTEROL SULFATE (2.5 MG/3ML) 0.083% IN NEBU: 2.5000 mg | INHALATION_SOLUTION | RESPIRATORY_TRACT | Status: DC | PRN

## 2016-12-04 MED ORDER — MUPIROCIN 2 % EX OINT
1.0000 "application " | TOPICAL_OINTMENT | Freq: Two times a day (BID) | CUTANEOUS | Status: AC
  Administered 2016-12-04 – 2016-12-07 (×6): 1 via NASAL
  Filled 2016-12-04: qty 22

## 2016-12-04 MED ORDER — HYDROCODONE-ACETAMINOPHEN 10-325 MG PO TABS
1.0000 | ORAL_TABLET | ORAL | Status: DC | PRN
  Administered 2016-12-05 – 2016-12-07 (×6): 1 via ORAL
  Filled 2016-12-04 (×8): qty 1

## 2016-12-04 MED ORDER — ONDANSETRON HCL 4 MG/2ML IJ SOLN: 4.0000 mg | Freq: Four times a day (QID) | INTRAMUSCULAR | Status: DC | PRN

## 2016-12-04 MED ORDER — ONDANSETRON HCL 4 MG PO TABS: 4.0000 mg | ORAL_TABLET | Freq: Four times a day (QID) | ORAL | Status: DC | PRN

## 2016-12-04 MED ORDER — SPIRONOLACTONE 25 MG PO TABS
25.0000 mg | ORAL_TABLET | Freq: Every day | ORAL | Status: DC
  Administered 2016-12-04 – 2016-12-09 (×5): 25 mg via ORAL
  Filled 2016-12-04 (×7): qty 1

## 2016-12-04 MED ORDER — HEPARIN BOLUS VIA INFUSION
4000.0000 [IU] | Freq: Once | INTRAVENOUS | Status: AC
  Administered 2016-12-04: 4000 [IU] via INTRAVENOUS
  Filled 2016-12-04: qty 4000

## 2016-12-04 MED ORDER — CLONAZEPAM 0.5 MG PO TABS
0.5000 mg | ORAL_TABLET | Freq: Two times a day (BID) | ORAL | Status: DC | PRN
  Administered 2016-12-04 – 2016-12-06 (×2): 0.5 mg via ORAL
  Filled 2016-12-04 (×2): qty 1

## 2016-12-04 MED ORDER — INSULIN ASPART 100 UNIT/ML ~~LOC~~ SOLN
0.0000 [IU] | Freq: Three times a day (TID) | SUBCUTANEOUS | Status: DC
  Administered 2016-12-04 (×2): 2 [IU] via SUBCUTANEOUS
  Administered 2016-12-04: 3 [IU] via SUBCUTANEOUS
  Administered 2016-12-05: 2 [IU] via SUBCUTANEOUS
  Administered 2016-12-05: 3 [IU] via SUBCUTANEOUS
  Administered 2016-12-06: 7 [IU] via SUBCUTANEOUS
  Administered 2016-12-06 – 2016-12-07 (×3): 2 [IU] via SUBCUTANEOUS
  Administered 2016-12-07: 5 [IU] via SUBCUTANEOUS
  Administered 2016-12-07: 2 [IU] via SUBCUTANEOUS
  Administered 2016-12-08 (×3): 3 [IU] via SUBCUTANEOUS
  Administered 2016-12-09: 2 [IU] via SUBCUTANEOUS
  Administered 2016-12-09: 7 [IU] via SUBCUTANEOUS

## 2016-12-04 MED ORDER — HEPARIN (PORCINE) IN NACL 100-0.45 UNIT/ML-% IJ SOLN
1100.0000 [IU]/h | INTRAMUSCULAR | Status: DC
Start: 2016-12-04 — End: 2016-12-05
  Administered 2016-12-04: 1100 [IU]/h via INTRAVENOUS
  Filled 2016-12-04 (×2): qty 250

## 2016-12-04 MED ORDER — PANTOPRAZOLE SODIUM 40 MG PO TBEC
40.0000 mg | DELAYED_RELEASE_TABLET | Freq: Every day | ORAL | Status: DC
  Administered 2016-12-04 – 2016-12-09 (×5): 40 mg via ORAL
  Filled 2016-12-04 (×6): qty 1

## 2016-12-04 MED ORDER — METOPROLOL TARTRATE 25 MG PO TABS
25.0000 mg | ORAL_TABLET | Freq: Two times a day (BID) | ORAL | Status: DC
  Administered 2016-12-04 (×3): 25 mg via ORAL
  Filled 2016-12-04 (×3): qty 1

## 2016-12-04 MED ORDER — POTASSIUM CHLORIDE CRYS ER 20 MEQ PO TBCR
20.0000 meq | EXTENDED_RELEASE_TABLET | Freq: Two times a day (BID) | ORAL | Status: DC
  Administered 2016-12-04 – 2016-12-09 (×9): 20 meq via ORAL
  Filled 2016-12-04 (×10): qty 1

## 2016-12-04 MED ORDER — TIOTROPIUM BROMIDE MONOHYDRATE 18 MCG IN CAPS
18.0000 ug | ORAL_CAPSULE | Freq: Every day | RESPIRATORY_TRACT | Status: DC
  Administered 2016-12-04 – 2016-12-05 (×2): 18 ug via RESPIRATORY_TRACT
  Filled 2016-12-04: qty 5

## 2016-12-04 MED ORDER — NICOTINE 7 MG/24HR TD PT24
7.0000 mg | MEDICATED_PATCH | Freq: Every day | TRANSDERMAL | Status: DC
  Administered 2016-12-04 – 2016-12-09 (×6): 7 mg via TRANSDERMAL
  Filled 2016-12-04 (×6): qty 1

## 2016-12-04 MED ORDER — ATORVASTATIN CALCIUM 20 MG PO TABS
20.0000 mg | ORAL_TABLET | Freq: Every day | ORAL | Status: DC
  Administered 2016-12-04 – 2016-12-07 (×4): 20 mg via ORAL
  Filled 2016-12-04 (×4): qty 1

## 2016-12-04 NOTE — Progress Notes (Signed)
Patient continue to refuse lab draw, wants to talk to wife about it.

## 2016-12-04 NOTE — H&P (Signed)
History and Physical  Patient Name: Kevin Gutierrez     QIW:[id number]    DOB: 1950/12/28    DOA: 12/03/2016 PCP: Coral Spikes, DO  Patient coming from: Home Oncology: Dr. Julien Nordmann   Chief Complaint: Leg swelling, hypoxia, hypotension      HPI: Kevin Gutierrez is a 66 y.o. 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  The patient was just admitted to the hospital in mid July for new dyspnea progressing over the summer, found to have lung mass, pleural effusion, which on transbronchial biopsy turned out to be SCLC. While in the hospital, he was found to have EF 35% (previously unknown), and LHC showed diffuse disease, for which further treatment is pending definitive staging of his cancer (MRI brain was negative for metastases and he is still awaiting PET).  In the meantime, he started etoposide/carboplatin last week. He was diuresed during the hospitalization and discharged on Lasix, which he has been taking.  Now in the last few days, he is having worsening generalized fatigue, markedly increased leg edema and abdominal swelling, decreased urine response to Lasix, and worsening hypoxia.  This morning, his wife checked his BP and is was in the 41D systolic, so she brought him to the ER.  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  ED course: -Afebrile, heart rate 96, respirations normal, BP 147/66 trending to 91/66, SpO2 93% on 2LNC -Na 137, K 4.4, Cr 1.8 (baseline 1.3), WBC 10.6K, Hgb 12.7 -AST/ALT 207/100 and trending up, total bilirubin 5.7 -Troponin 0.76 -BNP 435 -Lactate 1.66 -UA with RBCs and WBCs -CXR with the previous left sided opacity,  unchanged, no edema -He was given albuterol and TRH were asked to evaluate     ROS: Review of Systems  Constitutional: Positive for malaise/fatigue. Negative for chills and fever.  Respiratory: Positive for cough and shortness of breath. Negative for hemoptysis, sputum production and wheezing.   Cardiovascular: Positive for chest pain, orthopnea (chronic, unchanged) and leg swelling. Negative for palpitations and PND.  Neurological: Positive for weakness.  All other systems reviewed and are negative.         Past Medical History:  Diagnosis Date  . Chicken pox   . COPD (chronic obstructive pulmonary disease) ([place])   . History of kidney stones 05/17/2015  . History of stomach ulcers 05/17/2015  . Hypertension   . Kidney stones   . Lung cancer ([place])   . Nephrolithiasis   . Peptic ulcer disease   . Vitamin D deficiency     Past Surgical History:  Procedure Laterality Date  . LEFT HEART CATH AND CORONARY ANGIOGRAPHY N/A 11/07/2016   Procedure: Left Heart Cath and Coronary Angiography;  Surgeon: Troy Sine, MD;  Location: [facility] [facility];  Service: Cardiovascular;  Laterality: N/A;  . LITHOTRIPSY    . SPINAL CORD DECOMPRESSION  03/08/1994  . VIDEO BRONCHOSCOPY N/A 11/08/2016   Procedure: VIDEO BRONCHOSCOPY;  Surgeon: Collene Gobble, MD;  Location: [place];  Service: Cardiopulmonary;  Laterality: N/A;    Social History: Patient lives with his wife.  The patient walks unassisted usually, since discharge, he is mostly wheelchair  bound now.  Former smoker, quit last month.  From [place], worked as a Water engineer.  Allergies  Allergen Reactions  . No Known Allergies     Family history: family history includes Diabetes Mellitus II in his brother; Heart disease in his brother and mother; Hyperlipidemia in his brother; Hypertension in his brother; Lung cancer in his brother; Stroke in his father.  Prior to Admission medications   Medication Sig Start Date End Date Taking?  Authorizing Provider  albuterol (PROVENTIL) (2.5 MG/3ML) 0.083% nebulizer solution Take 3 mLs (2.5 mg total) by nebulization every 3 (three) hours as needed for wheezing or shortness of breath. 08/04/16  Yes Cook, Jayce G, DO  albuterol (VENTOLIN HFA) 108 (90 Base) MCG/ACT inhaler Inhale 2 puffs into the lungs every 4 (four) hours as needed for wheezing or shortness of breath. 07/18/16  Yes Flora Lipps, MD  aspirin EC 81 MG tablet Take 1 tablet (81 mg total) by mouth daily. 05/17/15  Yes Cook, Jayce G, DO  atorvastatin (LIPITOR) 20 MG tablet Take 1 tablet (20 mg total) by mouth daily. 08/04/16  Yes Cook, Jayce G, DO  blood glucose meter kit and supplies Dispense based on patient and insurance preference. Use up to four times daily as directed. (FOR ICD-10 E11.9). 11/11/16  Yes Johnson, Clanford L, MD  clonazePAM (KLONOPIN) 0.5 MG tablet Take 1 tablet (0.5 mg total) by mouth 2 (two) times daily as needed for anxiety. 11/02/16  Yes Cook, Jayce G, DO  furosemide (LASIX) 40 MG tablet Take 1 tablet (40 mg total) by mouth daily. 11/20/16  Yes Cook, Jayce G, DO  glipiZIDE (GLUCOTROL XL) 5 MG 24 hr tablet Take 1 tablet (5 mg total) by mouth daily with breakfast. 11/20/16  Yes Cook, Jayce G, DO  HYDROcodone-acetaminophen (NORCO) 10-325 MG tablet Take 1 tablet by mouth every 4 (four) hours as needed for severe pain (may take 1 tablet every 4-6 hours prn pain). 12/01/16  Yes Curt Bears, MD  isosorbide-hydrALAZINE (BIDIL) 20-37.5 MG tablet Take 1 tablet by mouth 3 (three) times daily. 11/11/16 12/11/16 Yes Johnson, Clanford L, MD  metoprolol tartrate (LOPRESSOR) 25 MG tablet Take 1 tablet (25 mg total) by mouth 2 (two) times daily. 11/11/16 12/11/16 Yes Johnson, Clanford L, MD  nystatin (MYCOSTATIN) 100000 UNIT/ML suspension TAKE  5 ML BY MOUTH 4 TIMES DAILY 11/14/16  Yes Kasa, Maretta Bees, MD  pantoprazole (PROTONIX) 40 MG tablet Take 1 tablet (40 mg total) by mouth daily. 08/04/16  Yes Cook, Jayce G, DO  prochlorperazine  (COMPAZINE) 10 MG tablet Take 1 tablet (10 mg total) by mouth every 6 (six) hours as needed for nausea or vomiting. 11/15/16  Yes Curt Bears, MD  tiotropium (SPIRIVA) 18 MCG inhalation capsule Place 1 capsule (18 mcg total) into inhaler and inhale daily. 08/30/16  Yes Kasa, Maretta Bees, MD  Fluticasone-Umeclidin-Vilant (TRELEGY ELLIPTA) 100-62.5-25 MCG/INH AEPB Inhale 1 Dose into the lungs daily. Patient not taking: Reported on 11/29/2016 11/20/16   Coral Spikes, DO       Physical Exam: BP (!) 161/66 (BP Location: Right Arm)   Pulse 94   Temp 98 F (36.7 C) (Oral)   Resp 18   Ht 6' (1.829 m)   Wt 94.8 kg (209 lb)   SpO2 92%   BMI 28.35 kg/m  General appearance: Thin elderly adult male, alert and in mild distress from fatigue.   Eyes: Anicteric, conjunctiva pink, lids and lashes normal. PERRL.    ENT: No nasal deformity, discharge, epistaxis.  Hearing normal. OP tacky dry without lesions.   Neck: No neck masses.  Trachea midline.  No thyromegaly/tenderness. Lymph: No cervical or supraclavicular lymphadenopathy. Skin: Warm and dry.  No jaundice.  No suspicious rashes or lesions. Cardiac: RRR, nl S1-S2, soft SEM.  Capillary refill is brisk.  JVP elevated.  3+ LE edema to hips.  Radial pulses 2+ and symmetric. Respiratory: Normal respiratory rate and rhythm.  Rales at bases, no wheezes. Abdomen: Abdomen soft.  No TTP. No ascites, hepatosplenomegaly.   Abdomen distended with fluid, not tense. MSK: No deformities or effusions.  No cyanosis or clubbing. Neuro: Cranial nerves normal.  Sensation intact to light touch. Speech is fluent.  Muscle strength globally weak, slow.    Psych: Sensorium intact and responding to questions, attention normal.  Behavior appropriate.  Affect blunted.  Judgment and insight appear normal.     Labs on Admission:  I have personally reviewed following labs and imaging studies: CBC:  Recent Labs Lab 11/28/16 0738 12/03/16 1855  WBC 11.3* 10.6*  NEUTROABS  10.0* 10.1*  HGB 14.2 12.7*  HCT 43.4 38.7*  MCV 94.5 94.9  PLT 98* 79*   Basic Metabolic Panel:  Recent Labs Lab 11/28/16 0738 12/03/16 1855  NA 137 137  K 3.8 4.4  CL  --  100*  CO2 28 25  GLUCOSE 415* 206*  BUN 24.6 58*  CREATININE 1.3 1.80*  CALCIUM 9.2 8.3*   GFR: Estimated Creatinine Clearance: 47.6 mL/min (A) (by C-G formula based on SCr of 1.8 mg/dL (H)).  Liver Function Tests:  Recent Labs Lab 11/28/16 0738 12/03/16 1855  AST 132* 207*  ALT 168* 100*  ALKPHOS 270* 176*  BILITOT 3.60* 5.7*  PROT 6.1* 6.0*  ALBUMIN 2.5* 2.5*   Cardiac Enzymes:  Recent Labs Lab 12/03/16 1855  TROPONINI 0.76*   CBG:  Recent Labs Lab 11/29/16 1031 12/04/16 0009  GLUCAP 272* 159*   Sepsis Labs: Lactate 1.66  Recent Results (from the past 240 hour(s))  MRSA PCR Screening     Status: Abnormal   Collection Time: 12/04/16 12:15 AM  Result Value Ref Range Status   MRSA by PCR POSITIVE (A) NEGATIVE Final    Comment:        The GeneXpert MRSA Assay (FDA approved for NASAL specimens only), is one component of a comprehensive MRSA colonization surveillance program. It is not intended to diagnose MRSA infection nor to guide or monitor treatment for MRSA infections. RESULT CALLED TO, READ BACK BY AND VERIFIED WITH: M OBRYANT AT 0159 ON 08.13.2018 BY NBROOKS          Radiological Exams on Admission: Personally reviewed CXR shows no edema, shows old Left opacity: Dg Chest 2 View  Result Date: 12/03/2016 CLINICAL DATA:  66 year old male recently diagnosis small cell lung cancer. Shortness of breath with cough and congestion for the past 3-4 weeks. EXAM: CHEST  2 VIEW COMPARISON:  Chest x-ray 11/29/2016. FINDINGS: Chronic volume loss and consolidation in the left upper lobe again noted. Mass-like area in the left hilar region, corresponding to known lymphadenopathy and left upper low mass. Right lung is clear. Small left pleural effusion. No evidence of pulmonary  edema. Heart size is normal. Aortic atherosclerosis. IMPRESSION: 1. No acute radiographic abnormality of the chest. The appearance of the chest is very similar to prior studies, with known left upper lobe mass, left hilar lymphadenopathy and extensive airspace consolidation and volume loss in the left upper lobe, presumably related to central obstruction. 2. Aortic atherosclerosis.  Electronically Signed   By: Vinnie Langton M.D.   On: 12/03/2016 21:11    EKG: Independently reviewed. Rate 86, QTc 476, normal sinus.  Echocardiogram July 2018: Report reviewed EF 35-40% Grade I DD      Assessment/Plan  1. Acute on chronic systolic and diastolic CHF:  Leg and abdomen swelling, elevated JVP and BNP.     -Furosemide 40 mg IV twice a day  -K supplement -Strict I/Os, daily weights, telemetry  -Daily monitoring renal function -Trend troponins   2. Elevated troponin:  Suspect this is type 2 NSTEMI.  Chest pain is atypical. -Trend troponin -Consult Cardiology  3. Thrombocytopenia:  In setting of chemo. -Monitor  4. Elevated LFTs:  Presumably this is congestive.  5. Small Cell Lung Cancer:  Suanne Marker FYI sent to Dr. Julien Nordmann -Continue Norco PRN  6. Acute kidney injury:  Baseline Cr 1.3, now 1.8.  Suspect congestive nephropathy. -Diuresis and daily BMP  7. Diabetes:  -Hold glipizide -SSI with meals  8. Hypertension: -Hold Bidil -Continue metoprolol with hold parameters -Continue aspirin, statin  9. Other medications: -Continue Klonazepam, PPI  10: COPD: -Continue Spiriva          DVT prophylaxis: SCDs Code Status: DO NOT RESUSCITATE  Family Communication: Wife at bedside  Disposition Plan: Anticipate IV diuresis for 2-3 days  Consults called: Cardiology Admission status: INPATIENT    Medical decision making: Patient seen at 9:45 PM on 12/03/2016.  The patient was discussed with Rodell Perna, PA-C.  What exists of the patient's chart was reviewed in depth  and summarized above.  Clinical condition: stable.        Edwin Dada [facility] Pager [phone]      At the time of admission, it appears that the appropriate admission status for this patient is INPATIENT. This is judged to be reasonable and necessary in order to provide the required intensity of service to ensure the patient's safety given the presenting symptoms, physical exam findings, and initial radiographic and laboratory data in the context of their chronic comorbidities.  Together, these circumstances are felt to place him at high risk for further clinical deterioration threatening life, limb, or organ.   Patient requires inpatient status due to high intensity of service, high risk for further deterioration and high frequency of surveillance required because of this severe exacerbation of their chronic organ failure.  I certify that at the point of admission it is my clinical judgment that the patient will require inpatient hospital care spanning beyond 2 midnights from the point of admission and that early discharge would result in unnecessary risk of decompensation and readmission or threat to life, limb or bodily function.

## 2016-12-04 NOTE — Progress Notes (Signed)
This encounter was created in error - please disregard.

## 2016-12-04 NOTE — Progress Notes (Signed)
Spoke with Pt. And Pt.'s wife about PICC insertion.  Explain risk and benefits of PICC insertion.  Pt. States does not want PICC right now and wants to think about it.  Will follow up later.

## 2016-12-04 NOTE — Progress Notes (Signed)
PROGRESS NOTE  Kevin Gutierrez  ZOX:[id number] DOB: 1951/02/08 DOA: 12/03/2016 PCP: Coral Spikes, DO  Outpatient Specialists: Oncology, Julien Nordmann  Brief Narrative: Kevin Gutierrez is a 66 y.o. male with a history of O2-dependence, COPD, tobacco use, NIDDM, as well as SCLC and HFrEF (both recently diagnosed) who presented to the ED for progressive weight gain, leg and abdominal swelling as well as fatigue and hypotension.   The patient was just admitted to the hospital in mid July for new dyspnea progressing over the summer, found to have lung mass, pleural effusion, which on transbronchial biopsy turned out to be SCLC. While in the hospital, he was found to have EF 35% (previously unknown), and LHC showed diffuse disease, for which further treatment is pending definitive staging of his cancer (MRI brain was negative for metastases and he is still awaiting PET).  In the meantime, he started etoposide/carboplatin last week. He was diuresed during the hospitalization and discharged on Lasix, which he has been taking.   Now in the last few days, he is having worsening generalized fatigue, markedly increased leg edema and abdominal swelling, decreased urine response to Lasix, and worsening dyspnea, requiring higher flow of O2 at home. 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 His wife reports measuring SBP in 80's for which she brought him to the ED. On arrival BNP was elevated to 425, weight was up ~10lbs from recent discharge, troponin 0.76. CXR demonstrated stable left side opacity without pulmonary edema. He was admitted for acute systolic CHF and ACS evaluation, started on diuresis and cardiology consulted.   Assessment & Plan: Principal Problem:   Acute on chronic combined systolic and diastolic CHF (congestive heart failure) (H[facility]) Active Problems:   Essential hypertension   AKI (acute kidney injury)  (HCC)   CKD (chronic kidney disease) stage 3, GFR 30-59 ml/min   Coronary artery disease   Small cell lung cancer, left upper lobe (HCC)   Type 2 diabetes mellitus (HCC)   Elevated LFTs   Elevated troponin   Thrombocytopenia (H[facility])  Acute on chronic systolic and diastolic CHF: Echo July 8119 showed EF 35-40%, G1DD. Discharge weight was 196lbs, 209lbs on admission.  - Continue lasix 40mg  IV BID, may need augmented home regimen. Cardiology adding spironolactone.  - Strict I/O, daily weights.   Elevated troponin: Suspect type II NSTEMI with known ischemic cardiomyopathy and atypical chest pain.  - Pt refused repeat troponins due to being a difficult stick, but has been 0.76 >> 0.95, with atypical chest pain. ECG not acutely ischemic (Isolated TWI in V6 on my interpretation, which was present previously). - Per cardiology.   Multivessel CAD: LHC on 11/07/2016 with 40% prox LAD stenosis, 60-70% mid LAD stenosis, segmental stenosis of prox Circ, extending into the 2 vessel, high grade 95% stenosis. Intervention was deferred until full lung cancer workup could be complete including treatment plan and prognosis estimation by pulmonology and oncology. - On ASA, statin, beta blocker  Chronic hypoxic respiratory failure: Multifactorial.  - Tx CHF and continue COPD Tx's  COPD:  - Continue spiriva and prn duonebs - Will attempt to avoid steroids for the time being with uncontrolled CBGs  Thrombocytopenia: Recent, since starting chemotherapy. Stable. - Monitor. Ok to continue antiplatelet with level >50k.  Elevated LFTs: Most consistent with congestive hepatopathy - Monitor  Small cell lung cancer: Recently started palliative chemotherapy per Dr. Julien Nordmann, reportedly tolerating this well. -  Port placement has been delayed pending imaging, per pt.    - PET scan scheduled 8/16.   Acute kidney injury on stage III CKD: SCr 1.8 on admission, up from baseline of 1.3.  - Monitor BMP daily with  diuresis - Avoid nephrotoxins  T2DM: HbA1c 7.1%, but significant hyperglycemia in setting of steroids. PCP recently stated low dose basaglar 5u.  - Holding [facility] and long-acting insulin for now, on SSI  Essential HTN: Presented with hyPOtension.  - Holding Bidil to allow BP room for diuresis - Continuing beta blocker with hold parameters.   Anxiety:  - Continue home clonazepam  GERD: Chronic, stable - Continue PPI  DVT prophylaxis: SCDs Code Status: DNR Family Communication: Wife at bedside Disposition Plan: Uncertain, will need significant diuresis.   Consultants:   Cardiology  Dr. Julien Nordmann alerted through [facility] and added to treatment team.  Procedures:   None  Antimicrobials:  None   Subjective: Dyspnea is somewhat improved from admission, still worse than baseline. Had good urine output after lasix. No exertional chest pain.   Objective: Vitals:   12/03/16 2305 12/04/16 0008 12/04/16 0500 12/04/16 1050  BP: 136/71 (!) 161/66  (!) 134/57  Pulse: 92 94  85  Resp: 18 18    Temp:  98 F (36.7 C)    TempSrc:  Oral    SpO2: 92% 92%    Weight:  94.8 kg (209 lb) 94.8 kg (208 lb 15.9 oz)   Height:  6' (1.829 m)      Intake/Output Summary (Last 24 hours) at 12/04/16 1312 Last data filed at 12/04/16 1057  Gross per 24 hour  Intake              120 ml  Output             1650 ml  Net            -1530 ml   Filed Weights   12/03/16 2019 12/04/16 0008 12/04/16 0500  Weight: 95 kg (209 lb 7 oz) 94.8 kg (209 lb) 94.8 kg (208 lb 15.9 oz)   Examination: General exam: Chronically ill-appearing 66 y.o. male in no distress Respiratory system: Non-labored on supplemental oxygen, diminished on left with wheezing throughout.  Cardiovascular system: Regular rate and rhythm. No murmur, rub, or gallop. + JVD. Gastrointestinal system: Abdomen protuberant, soft, non-tender, normoactive bowel sounds. Central nervous system: Alert and oriented. No focal neurological  deficits. Extremities: Warm, no deformities. Legs with 3+ pitting edema below knees. No hair growth on lower legs/feet.  Skin: No rashes, lesions no ulcers Psychiatry: Judgement and insight appear normal. Mood & affect appropriate.   Data Reviewed: I have personally reviewed following labs and imaging studies  CBC:  Recent Labs Lab 11/28/16 0738 12/03/16 1855 12/04/16 1020  WBC 11.3* 10.6* 7.1  NEUTROABS 10.0* 10.1*  --   HGB 14.2 12.7* 12.4*  HCT 43.4 38.7* 37.0*  MCV 94.5 94.9 93.0  PLT 98* 79* 69*   Basic Metabolic Panel:  Recent Labs Lab 11/28/16 0738 12/03/16 1855 12/04/16 1020  NA 137 137 140  K 3.8 4.4 4.2  CL  --  100* 102  CO2 28 25 26   GLUCOSE 415* 206* 231*  BUN 24.6 58* 60*  CREATININE 1.3 1.80* 1.90*  CALCIUM 9.2 8.3* 8.3*   GFR: Estimated Creatinine Clearance: 45.1 mL/min (A) (by C-G formula based on SCr of 1.9 mg/dL (H)). Liver Function Tests:  Recent Labs Lab 11/28/16 0738 12/03/16 1855  AST 132* 207*  ALT 168* 100*  ALKPHOS 270* 176*  BILITOT 3.60* 5.7*  PROT 6.1* 6.0*  ALBUMIN 2.5* 2.5*   Cardiac Enzymes:  Recent Labs Lab 12/03/16 1855 12/04/16 1020  TROPONINI 0.76* 0.95*   CBG:  Recent Labs Lab 11/29/16 1031 12/04/16 0009 12/04/16 0802 12/04/16 1135  GLUCAP 272* 159* 170* 230*   Urine analysis:    Component Value Date/Time   COLORURINE AMBER (A) 12/03/2016 2003   APPEARANCEUR CLOUDY (A) 12/03/2016 2003   APPEARANCEUR Clear 03/15/2016 0853   LABSPEC 1.014 12/03/2016 2003   PHURINE 5.0 12/03/2016 2003   GLUCOSEU NEGATIVE 12/03/2016 2003   HGBUR LARGE (A) 12/03/2016 2003   BILIRUBINUR NEGATIVE 12/03/2016 2003   BILIRUBINUR Negative 03/15/2016 [place] 12/03/2016 2003   PROTEINUR 30 (A) 12/03/2016 2003   NITRITE NEGATIVE 12/03/2016 2003   LEUKOCYTESUR TRACE (A) 12/03/2016 2003   LEUKOCYTESUR Negative 03/15/2016 0853   Recent Results (from the past 240 hour(s))  MRSA PCR Screening     Status:  Abnormal   Collection Time: 12/04/16 12:15 AM  Result Value Ref Range Status   MRSA by PCR POSITIVE (A) NEGATIVE Final    Comment:        The GeneXpert MRSA Assay (FDA approved for NASAL specimens only), is one component of a comprehensive MRSA colonization surveillance program. It is not intended to diagnose MRSA infection nor to guide or monitor treatment for MRSA infections. RESULT CALLED TO, READ BACK BY AND VERIFIED WITH: Ronnald Ramp AT 0159 ON 08.13.2018 BY NBROOKS       Radiology Studies: Dg Chest 2 View  Result Date: 12/03/2016 CLINICAL DATA:  66 year old male recently diagnosis small cell lung cancer. Shortness of breath with cough and congestion for the past 3-4 weeks. EXAM: CHEST  2 VIEW COMPARISON:  Chest x-ray 11/29/2016. FINDINGS: Chronic volume loss and consolidation in the left upper lobe again noted. Mass-like area in the left hilar region, corresponding to known lymphadenopathy and left upper low mass. Right lung is clear. Small left pleural effusion. No evidence of pulmonary edema. Heart size is normal. Aortic atherosclerosis. IMPRESSION: 1. No acute radiographic abnormality of the chest. The appearance of the chest is very similar to prior studies, with known left upper lobe mass, left hilar lymphadenopathy and extensive airspace consolidation and volume loss in the left upper lobe, presumably related to central obstruction. 2. Aortic atherosclerosis. Electronically Signed   By: Vinnie Langton M.D.   On: 12/03/2016 21:11     LOS: 1 day   Time spent: 25 minutes.  Vance Gather, MD [facility] Pager [phone]  If 7PM-7AM, please contact night-coverage [email or web address] Password [email or web address] 12/04/2016, 1:12 PM

## 2016-12-04 NOTE — Progress Notes (Signed)
Patient continue to refuse lab draw, continue to re-educate patient of the reason and also wife encouraged patient to allow lab draw and he still refused.

## 2016-12-04 NOTE — Progress Notes (Signed)
ANTICOAGULATION CONSULT NOTE - Initial Consult  Pharmacy Consult for heparin Indication: chest pain/ACS  Allergies  Allergen Reactions  . No Known Allergies     Patient Measurements: Height: 6' (182.9 cm) Weight: 208 lb 15.9 oz (94.8 kg) IBW/kg (Calculated) : 77.6 Heparin Dosing Weight: 94.8kg  Vital Signs: BP: 134/57 (08/13 1050) Pulse Rate: 85 (08/13 1050)  Labs:  Recent Labs  12/03/16 1855 12/04/16 1020  HGB 12.7* 12.4*  HCT 38.7* 37.0*  PLT 79* 69*  CREATININE 1.80* 1.90*  TROPONINI 0.76* 0.95*    Estimated Creatinine Clearance: 45.1 mL/min (A) (by C-G formula based on SCr of 1.9 mg/dL (H)).   Medical History: Past Medical History:  Diagnosis Date  . Chicken pox   . COPD (chronic obstructive pulmonary disease) ([place])   . History of kidney stones 05/17/2015  . History of stomach ulcers 05/17/2015  . Hypertension   . Kidney stones   . Lung cancer ([place])   . Nephrolithiasis   . Peptic ulcer disease   . Vitamin D deficiency      Assessment: 66 y.o. male with a hx of HTN, HLD, COPD and stage 3 CKD.  Patient has recent diagnosis of small cell lung cancer on palliative chemo.   In the last few days, he is having worsening generalized fatigue, markedly increased leg edema and abdominal swelling, decreased urine response to Lasix, and worsening dyspnea, requiring higher flow of O2 at home. Pharmacy consulted to dose heparin drip for ACS/STEMI.   12/04/2016  H/H low but stable Plts 69 Scr 1.8 (baseline 1.3) Troponin 0.76   Goal of Therapy:  Heparin level 0.3-0.7 units/ml Monitor platelets by anticoagulation protocol   Plan:  Bolus heparin 4000 units x 1 Start heparin drip at 1100 units/hr Heparin level in 6 hours Daily CBC and heparin level   Dolly Rias RPh 12/04/2016, 2:56 PM Pager [phone]

## 2016-12-04 NOTE — Progress Notes (Signed)
1849 patient refused PICC placement for tonight. Please check tomorrow.

## 2016-12-04 NOTE — Progress Notes (Signed)
IV team called back to find out if patient will allow them insert PICC line, patient refused and stated they can try back tomorrow. Wife at the bedside and aware of patient refusal.

## 2016-12-04 NOTE — Consult Note (Addendum)
Cardiology Consultation:   Patient ID: DEVELLE SIEVERS; [id number]; Jun 10, 1950   Admit date: 12/03/2016 Date of Consult: 12/04/2016  Primary Care Provider: Coral Spikes, DO Primary Cardiologist: Dr. Sallyanne Kuster (saw once in [facility])   Patient Profile:   Kevin Gutierrez is a 66 y.o. male with a hx of HTN, HLD, COPD and stage 3 CKD who is being seen today for the evaluation of CHF at the request of Dr. Loleta Books. Patient has recent diagnosis of small cell lung cancer on palliative chemo.  He was seen in consultation by Dr. Sallyanne Kuster during a hospitalization in 10/2016 for reduced EF of 35-40% in the setting of worsening dyspnea on exertion. He was being treated for acute respiratory failure secondary to PNA and CPOD exacerbation. The patient was having to use his oxygen more often.  The patient was found to have left hilar mass and left pleural effusion which was tapped for 800cc of fluid.   There is no known prior history of CAD. He does have a strong family history of CAD with his mother having died of a massive MI in her 68's and his brother having CABG in his 58's. The patient used to smoke 2 PPD but is now down to about 4 cigarettes per day. He denies alcohol or recreational drug use.   A LHC was done on 11/07/16 revealing significant multivessel CAD with 40% prox LAD stenosis, 60-70% mid LAD stenosis, segmental stenosis of prox Circ, extending into the 2 vessel, high grade 95% stenosis. Intervention was deferred until full lung cancer workup could be complete including treatment plan and prognosis estimation by pulmonology and oncology. The patient was placed on aspirin with plan to consider clopidogrel if treatment plan can accommodate. He has been started on palliative chemotherapy.   He presented to [facility] on 12/03/16 for evaluation of leg swelling and fatigue for 2 days. He is noted to have markedly increased leg edema, abdominal swelling, decreased urine response to Lasix and worsening dyspnea. He had his  first chemo treatment on Tuesday, then daily and was tolerating it well. On Friday night he sang at a dance hall. On Saturday his chest was sore from singing. On Saturday night he was unable to lay down to sleep due to orthopnea.  Pertinenet labs: Troponin: 0.76 BNP 435 Serum creatinine: 1.8 (baseline 1.3) CXR: no edema, known left upper lobe mass   History of Present Illness:   Kevin Gutierrez presented to [facility] on 12/03/16 for evaluation of leg swelling and fatigue for 2 days. He is noted to have markedly increased leg edema, abdominal swelling, decreased urine response to Lasix and worsening dyspnea.   He was seen in consultation by Dr. Sallyanne Kuster during a hospitalization in 10/2016 for reduced EF of 35-40% in the setting of worsening dyspnea on exertion. He was being treated for acute respiratory failure secondary to PNA and CPOD exacerbation. The patient was having to use his oxygen more often.  The patient was found to have left hilar mass and left pleural effusion which was tapped for 800cc of fluid.   There is no known prior history of CAD. He does have a strong family history of CAD with his mother having died of a massive MI in her 86's and his brother having CABG in his 71's. The patient used to smoke 2 PPD but is now down to less than 1 PPD. He denies alcohol or recreational drug use.   A LHC was done on 11/07/16 revealing significant multivessel CAD with  40% prox LAD stenosis, 60-70% mid LAD stenosis, segmental stenosis of prox Circ, extending into the 2 vessel, high grade 95% stenosis. Intervention was deferred until full lung cancer workup could be complete including treatment plan and prognosis estimation by pulmonology and oncology. The patient was placed on aspirin with plan to consider clopidogrel if treatment plan can accommodate. He has been started on palliative chemotherapy.    Pertinenet labs: Troponin: 0.76 BNP 435 Serum creatinine: 1.8 (baseline 1.3) CXR: no edema, known left  upper lobe mass   Past Medical History:  Diagnosis Date  . Chicken pox   . COPD (chronic obstructive pulmonary disease) ([place])   . History of kidney stones 05/17/2015  . History of stomach ulcers 05/17/2015  . Hypertension   . Kidney stones   . Lung cancer ([place])   . Nephrolithiasis   . Peptic ulcer disease   . Vitamin D deficiency     Past Surgical History:  Procedure Laterality Date  . LEFT HEART CATH AND CORONARY ANGIOGRAPHY N/A 11/07/2016   Procedure: Left Heart Cath and Coronary Angiography;  Surgeon: Troy Sine, MD;  Location: [facility];  Service: Cardiovascular;  Laterality: N/A;  . LITHOTRIPSY    . SPINAL CORD DECOMPRESSION  03/08/1994  . VIDEO BRONCHOSCOPY N/A 11/08/2016   Procedure: VIDEO BRONCHOSCOPY;  Surgeon: Collene Gobble, MD;  Location: [facility];  Service: Cardiopulmonary;  Laterality: N/A;     Inpatient Medications: Scheduled Meds: . aspirin EC  81 mg Oral Daily  . atorvastatin  20 mg Oral q1800  . Chlorhexidine Gluconate Cloth  6 each Topical Q0600  . furosemide  40 mg Intravenous BID  . insulin aspart  0-5 Units Subcutaneous QHS  . insulin aspart  0-9 Units Subcutaneous TID WC  . metoprolol tartrate  25 mg Oral BID  . mupirocin ointment  1 application Nasal BID  . nicotine  7 mg Transdermal Daily  . pantoprazole  40 mg Oral Daily  . potassium chloride  20 mEq Oral BID  . tiotropium  18 mcg Inhalation Daily   Continuous Infusions:  PRN Meds: albuterol, clonazePAM, HYDROcodone-acetaminophen, ondansetron **OR** ondansetron (ZOFRAN) IV  Allergies:    Allergies  Allergen Reactions  . No Known Allergies     Social History:   Social History   Social History  . Marital status: Married    Spouse name: N/A  . Number of children: N/A  . Years of education: N/A   Occupational History  . retired    Social History Main Topics  . Smoking status: Current Every Day Smoker    Packs/day: 0.50    Years: 50.00    Types: Cigarettes  . Smokeless  tobacco: Never Used     Comment: Currently smoking 5 cigarettes daily  . Alcohol use No  . Drug use: Yes    Types: Marijuana     Comment: Occasional marijuana use  . Sexual activity: Yes   Other Topics Concern  . Not on file   Social History Narrative   Lives with family    Family History:    Family History  Problem Relation Age of Onset  . Diabetes Mellitus II Brother   . Lung cancer Brother   . Heart disease Brother        CABG in his 17's  . Hyperlipidemia Brother   . Heart disease Mother        MI in her 85's  . Stroke Father   . Hypertension Brother  ROS:  Please see the history of present illness.  ROS  All other ROS reviewed and negative.     Physical Exam/Data:   Vitals:   12/03/16 2223 12/03/16 2305 12/04/16 0008 12/04/16 0500  BP: 131/70 136/71 (!) 161/66   Pulse: 93 92 94   Resp: 18 18 18    Temp:   98 F (36.7 C)   TempSrc:   Oral   SpO2: 91% 92% 92%   Weight:   209 lb (94.8 kg) 208 lb 15.9 oz (94.8 kg)  Height:   6' (1.829 m)     Intake/Output Summary (Last 24 hours) at 12/04/16 1007 Last data filed at 12/04/16 0727  Gross per 24 hour  Intake                0 ml  Output             1100 ml  Net            -1100 ml   Filed Weights   12/03/16 2019 12/04/16 0008 12/04/16 0500  Weight: 209 lb 7 oz (95 kg) 209 lb (94.8 kg) 208 lb 15.9 oz (94.8 kg)   Body mass index is 28.34 kg/m.  General:  Chronically ill appearing HEENT: normal Neck: no JVD Vascular: pedal pulses 2+ bilaterally  Cardiac:  normal S1, S2; RRR; no murmur  Lungs:  Inspiratory and expiratory wheezes bilat Abd: soft, nontender, distended Ext: 2+ LE edema from knees down Musculoskeletal:  No deformities, BUE and BLE strength normal and equal Skin: warm and dry  Neuro:  CNs 2-12 intact, no focal abnormalities noted Psych:  Normal affect   EKG:  The EKG was personally reviewed and demonstrates:  NSR with PAC, RAD, LAE, non specific T wave abnormality Telemetry:   Telemetry was personally reviewed and demonstrates:  NSR 70's-90's  Relevant CV Studies:  Left Heart catheterization 11/07/16 Conclusion     Mid RCA-2 lesion, 30 %stenosed.  Mid RCA-1 lesion, 95 %stenosed.  Dist RCA lesion, 55 %stenosed.  Mid LAD lesion, 40 %stenosed.  Mid LAD to Dist LAD lesion, 60 %stenosed.  Dist LAD lesion, 70 %stenosed.  Prox Cx to Mid Cx lesion, 70 %stenosed.  Dist Cx lesion, 70 %stenosed.  2nd Mrg lesion, 70 %stenosed.  The left ventricular ejection fraction is 45-50% by visual estimate.  There is mild left ventricular systolic dysfunction.  LV end diastolic pressure is moderately elevated.   Mild LV dysfunction with inferior hypo-kinesis in a global ejection fraction at 45-50%.  Significant multivessel CAD with [place] 40% LAD stenosis with 60-70% mid LAD stenosis; segmental 70% stenosis in the proximal circumflex extending into the on 2 vessel; and high-grade 95% focal mid RCA stenosis with 30% followed by 50-60% stenosis prior to the PDA takeoff.  RECOMMENDATION: The patient is undergoing current evaluation of a lung mass which is felt possibly due to lung cancer.  He will require biopsying.  Angiograms were reviewed with Dr. Sallyanne Kuster.  Percutaneous coronary Intervention or CABG revascularization surgery will be considered following pulmonary workup.        Echocardiogram 11/02/16 Study Conclusions - Left ventricle: The cavity size was normal. There was mild   concentric hypertrophy. Systolic function was moderately reduced.   The estimated ejection fraction was in the range of 35% to 40%.   Diffuse hypokinesis. Doppler parameters are consistent with   abnormal left ventricular relaxation (grade 1 diastolic   dysfunction). - Right atrium: The atrium was mildly dilated. - Pulmonary arteries: Systolic  pressure could not be accurately   estimated. - Inferior vena cava: The vessel was dilated. Respirophasic changes   in dimension were  absent, consistent with elevated central venous   pressure. - Pericardium, extracardiac: Possible left pleural effusion  Laboratory Data:  Chemistry  Recent Labs Lab 11/28/16 0738 12/03/16 1855  NA 137 137  K 3.8 4.4  CL  --  100*  CO2 28 25  GLUCOSE 415* 206*  BUN 24.6 58*  CREATININE 1.3 1.80*  CALCIUM 9.2 8.3*  GFRNONAA  --  37*  GFRAA  --  43*  ANIONGAP 13* 12     Recent Labs Lab 11/28/16 0738 12/03/16 1855  PROT 6.1* 6.0*  ALBUMIN 2.5* 2.5*  AST 132* 207*  ALT 168* 100*  ALKPHOS 270* 176*  BILITOT 3.60* 5.7*   Hematology  Recent Labs Lab 11/28/16 0738 12/03/16 1855  WBC 11.3* 10.6*  RBC 4.60 4.08*  HGB 14.2 12.7*  HCT 43.4 38.7*  MCV 94.5 94.9  MCH 30.9 31.1  MCHC 32.7 32.8  RDW 16.7* 17.7*  PLT 98* 79*   Cardiac Enzymes  Recent Labs Lab 12/03/16 1855  TROPONINI 0.76*   No results for input(s): TROPIPOC in the last 168 hours.  BNP  Recent Labs Lab 12/03/16 1855  BNP 435.1*    DDimer No results for input(s): DDIMER in the last 168 hours.  Radiology/Studies:  Dg Chest 2 View  Result Date: 12/03/2016 CLINICAL DATA:  66 year old male recently diagnosis small cell lung cancer. Shortness of breath with cough and congestion for the past 3-4 weeks. EXAM: CHEST  2 VIEW COMPARISON:  Chest x-ray 11/29/2016. FINDINGS: Chronic volume loss and consolidation in the left upper lobe again noted. Mass-like area in the left hilar region, corresponding to known lymphadenopathy and left upper low mass. Right lung is clear. Small left pleural effusion. No evidence of pulmonary edema. Heart size is normal. Aortic atherosclerosis. IMPRESSION: 1. No acute radiographic abnormality of the chest. The appearance of the chest is very similar to prior studies, with known left upper lobe mass, left hilar lymphadenopathy and extensive airspace consolidation and volume loss in the left upper lobe, presumably related to central obstruction. 2. Aortic atherosclerosis.  Electronically Signed   By: Vinnie Langton M.D.   On: 12/03/2016 21:11    Assessment and Plan:   Acute on chronic systolic and diastolic heart failure -Reduced EF noted in July, 35-40% -LHC found significant multivessel CAD. -Pt presents with worsening dyspnea, edema, orthopnea and fatigue. Symptoms may have been brought on by over exertion as pt was singing at a seniors club on Friday night. Of note he also started chemotherapy on Tuesday. -Pt has had decreased urine response to po lasix. -CXR shows no edema. BNP 435 -Currently on lasix 40 mg IV BID, has received 2 doses. 1100 ml urine output so far.  -Continue diuresing. Will add spironolactone per Dr. Meda Coffee. -Follow up with cardiology as outpatient.   CAD -LHC in 10/2016 found significant multivessel CAD. In setting of lung cancer and poor prognosis, conservative therapy was elected -No significant chest pain. Pt has some chest wall discomfort after singing that he relates to his increased work of breathing to sing.  -Continue aspirin, statin, BB  Small cell lung cancer -Followed by Dr. Julien Nordmann. On palliative chemo. Awaiting PET scan for further evaluation.  Hypertension -BP is reasonably well controlled with some soft BP's -IM is holding bidil for now. -Continue metoprolol  CKD stage 2 -[place] kidney and nephrolithiasis related.  -SCr  1.8 (baseline 1.3). Possibly related to CHF exacerbation. -Continue to monitor renal function with diuresis, may improve with improved fluid status.   COPD/chronic respiratory insufficiency -On home O2 with recent increased O2 demands. May improve with diuresis.  -Management per IM  Signed, Daune Perch, NP  12/04/2016 10:07 AM   The patient was seen, examined and discussed with Daune Perch, NP-C and I agree with the above.   66 y.o. male with a hx of HTN, HLD, COPD and stage 3 CKD who is being seen today for the evaluation of CHF,  H/o hospitalization in 10/2016 for reduced EF of  35-40%, acute respiratory failure secondary to PNA and CPOD exacerbation.  A LHC was done on 11/07/16 revealing significant multivessel CAD with 40% prox LAD stenosis, 60-70% mid LAD stenosis, segmental stenosis of prox Circ, extending into the 2 vessel, high grade 95% stenosis. Intervention was deferred until full lung cancer workup could be complete including treatment plan and prognosis estimation by pulmonology and oncology. The patient was placed on aspirin with plan to consider clopidogrel if treatment plan can accommodate. He has been started on palliative chemotherapy. Therapy will be decided based on follow up staging scans and overall prognosis.  Kevin Gutierrez presented to [facility] on 12/03/16 for evaluation of leg swelling and fatigue for 2 days. He is noted to have markedly increased leg edema, abdominal swelling, decreased urine response to Lasix and worsening dyspnea. Baseline weight 195 lbs, today 209 lbs, lasix 40 mg po daily, on lasix 40 mg iv BID in the hospital. I would continue, Crea 1.8 from baseline 1.1 -1.3, we will follow. Troponin elevated in the settings of acute CHF and acute on chronic kidney failure, however known severe 3 vessel disease, we will start heparin drip until downtrend.  Ena Dawley, MD 12/04/2016

## 2016-12-04 NOTE — Progress Notes (Signed)
Patient is still refusing lab draws. States "He would rather die than to be stuck again". Notified MD. Will monitor patient closely

## 2016-12-05 ENCOUNTER — Other Ambulatory Visit: Payer: PPO

## 2016-12-05 ENCOUNTER — Ambulatory Visit: Payer: PPO | Admitting: Oncology

## 2016-12-05 ENCOUNTER — Telehealth: Payer: Self-pay

## 2016-12-05 DIAGNOSIS — D649 Anemia, unspecified: Secondary | ICD-10-CM

## 2016-12-05 DIAGNOSIS — C3412 Malignant neoplasm of upper lobe, left bronchus or lung: Secondary | ICD-10-CM

## 2016-12-05 DIAGNOSIS — E119 Type 2 diabetes mellitus without complications: Secondary | ICD-10-CM

## 2016-12-05 DIAGNOSIS — R0609 Other forms of dyspnea: Secondary | ICD-10-CM

## 2016-12-05 DIAGNOSIS — I10 Essential (primary) hypertension: Secondary | ICD-10-CM

## 2016-12-05 DIAGNOSIS — I509 Heart failure, unspecified: Secondary | ICD-10-CM

## 2016-12-05 DIAGNOSIS — I471 Supraventricular tachycardia: Secondary | ICD-10-CM

## 2016-12-05 DIAGNOSIS — D696 Thrombocytopenia, unspecified: Secondary | ICD-10-CM

## 2016-12-05 LAB — BASIC METABOLIC PANEL
Anion gap: 11 (ref 5–15)
BUN: 63 mg/dL — AB (ref 6–20)
CHLORIDE: 102 mmol/L (ref 101–111)
CO2: 27 mmol/L (ref 22–32)
Calcium: 8.1 mg/dL — ABNORMAL LOW (ref 8.9–10.3)
Creatinine, Ser: 1.81 mg/dL — ABNORMAL HIGH (ref 0.61–1.24)
GFR calc Af Amer: 43 mL/min — ABNORMAL LOW (ref >60)
GFR calc non Af Amer: 37 mL/min — ABNORMAL LOW (ref >60)
GLUCOSE: 253 mg/dL — AB (ref 65–99)
POTASSIUM: 4.8 mmol/L (ref 3.5–5.1)
Sodium: 140 mmol/L (ref 135–145)

## 2016-12-05 LAB — CBC
HCT: 37 % — ABNORMAL LOW (ref 39.0–52.0)
Hemoglobin: 12.2 g/dL — ABNORMAL LOW (ref 13.0–17.0)
MCH: 30.7 pg (ref 26.0–34.0)
MCHC: 33 g/dL (ref 30.0–36.0)
MCV: 93 fL (ref 78.0–100.0)
PLATELETS: 58 10*3/uL — AB (ref 150–400)
RBC: 3.98 MIL/uL — AB (ref 4.22–5.81)
RDW: 17.6 % — ABNORMAL HIGH (ref 11.5–15.5)
WBC: 4.3 10*3/uL (ref 4.0–10.5)

## 2016-12-05 LAB — TROPONIN I
TROPONIN I: 0.71 ng/mL — AB (ref <0.03)
Troponin I: 0.84 ng/mL (ref <0.03)
Troponin I: 0.7 ng/mL (ref <0.03)

## 2016-12-05 LAB — GLUCOSE, CAPILLARY
GLUCOSE-CAPILLARY: 241 mg/dL — AB (ref 65–99)
Glucose-Capillary: 161 mg/dL — ABNORMAL HIGH (ref 65–99)
Glucose-Capillary: 223 mg/dL — ABNORMAL HIGH (ref 65–99)

## 2016-12-05 LAB — HEPARIN LEVEL (UNFRACTIONATED): Heparin Unfractionated: <0.1 IU/mL — ABNORMAL LOW (ref 0.30–0.70)

## 2016-12-05 MED ORDER — SODIUM CHLORIDE 0.9% FLUSH
10.0000 mL | INTRAVENOUS | Status: DC | PRN
  Administered 2016-12-06 – 2016-12-07 (×3): 10 mL
  Administered 2016-12-08: 20 mL
  Filled 2016-12-05 (×4): qty 40

## 2016-12-05 MED ORDER — ISOSORB DINITRATE-HYDRALAZINE 20-37.5 MG PO TABS
1.0000 | ORAL_TABLET | Freq: Three times a day (TID) | ORAL | Status: DC
  Administered 2016-12-05 – 2016-12-09 (×9): 1 via ORAL
  Filled 2016-12-05 (×11): qty 1

## 2016-12-05 MED ORDER — METOPROLOL TARTRATE 50 MG PO TABS
50.0000 mg | ORAL_TABLET | Freq: Two times a day (BID) | ORAL | Status: DC
  Administered 2016-12-05 – 2016-12-07 (×6): 50 mg via ORAL
  Filled 2016-12-05 (×8): qty 1

## 2016-12-05 MED ORDER — ENOXAPARIN SODIUM 100 MG/ML ~~LOC~~ SOLN
100.0000 mg | Freq: Two times a day (BID) | SUBCUTANEOUS | Status: DC
  Administered 2016-12-05 (×2): 100 mg via SUBCUTANEOUS
  Filled 2016-12-05 (×2): qty 1

## 2016-12-05 MED ORDER — FUROSEMIDE 10 MG/ML IJ SOLN
40.0000 mg | Freq: Four times a day (QID) | INTRAMUSCULAR | Status: DC
  Administered 2016-12-05 – 2016-12-09 (×14): 40 mg via INTRAVENOUS
  Filled 2016-12-05 (×15): qty 4

## 2016-12-05 NOTE — Progress Notes (Signed)
PROGRESS NOTE  Kevin Gutierrez  AOZ:[id number] DOB: July 07, 1950 DOA: 12/03/2016 PCP: Coral Spikes, DO  Outpatient Specialists: Oncology, Julien Nordmann  Brief Narrative: Kevin Gutierrez is a 66 y.o. male with a history of O2-dependence, COPD, tobacco use, NIDDM, as well as SCLC and HFrEF (both recently diagnosed) who presented to the ED for progressive weight gain, leg and abdominal swelling as well as fatigue and hypotension.   The patient was just admitted to the hospital in mid July for new dyspnea progressing over the summer, found to have lung mass, pleural effusion, which on transbronchial biopsy turned out to be SCLC. While in the hospital, he was found to have EF 35% (previously unknown), and LHC showed diffuse disease, for which further treatment is pending definitive staging of his cancer (MRI brain was negative for metastases and he is still awaiting PET).  In the meantime, he started etoposide/carboplatin last week. He was diuresed during the hospitalization and discharged on Lasix, which he has been taking.   Now in the last few days, he is having worsening generalized fatigue, markedly increased leg edema and abdominal swelling, decreased urine response to Lasix, and worsening dyspnea, requiring higher flow of O2 at home. 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 His wife reports measuring SBP in 80's for which she brought him to the ED. On arrival BNP was elevated to 425, weight was up ~10lbs from recent discharge, troponin 0.76. CXR demonstrated stable left side opacity without pulmonary edema. He was admitted for acute systolic CHF and ACS evaluation, started on diuresis and cardiology consulted. Anticoagulation started for NSTEMI and PICC line inserted for poor IV access.   Assessment & Plan: Principal Problem:   Acute on chronic combined systolic and diastolic CHF (congestive heart failure)  (HCC) Active Problems:   Essential hypertension   AKI (acute kidney injury) (HCC)   CKD (chronic kidney disease) stage 3, GFR 30-59 ml/min   Coronary artery disease   Small cell lung cancer, left upper lobe (HCC)   Type 2 diabetes mellitus (HCC)   Elevated LFTs   Elevated troponin   Thrombocytopenia (HCC)  Acute on chronic systolic and diastolic CHF: Echo July 9629 showed EF 35-40%, G1DD. Discharge weight was 196lbs, 209lbs on admission.  - Continue lasix 40mg  IV BID, weight unchanged, still very overloaded, increase lasix 40mg  IV q6h per cardiology, may need augmented home regimen. - Added spironolactone, monitor Cr, K.  - Strict I/O, daily weights.   Elevated troponin: Suspect type II NSTEMI with known ischemic cardiomyopathy and atypical chest pain.  - Seems to have plateaued. 0.76 > 0.95 > 0.84. Heparin per cardiology recommendations, will switch to lovenox due to loss of IV.   Multivessel CAD: LHC on 11/07/2016 with 40% prox LAD stenosis, 60-70% mid LAD stenosis, segmental stenosis of prox Circ, extending into the 2 vessel, high grade 95% stenosis. Intervention was deferred until full lung cancer workup could be complete including treatment plan and prognosis estimation by pulmonology and oncology. - On ASA, statin, beta blocker, and lovenox as above.  Chronic hypoxic respiratory failure: Multifactorial.  - Tx CHF and continue COPD Tx's  COPD:  - Continue spiriva and prn duonebs - No longer wheezing; attempting to avoid steroids with uncontrolled CBGs  Thrombocytopenia: Recent, since starting chemotherapy. Stable. - Monitor. Ok to continue antiplatelet with level >50k.  Elevated LFTs: Most consistent with congestive hepatopathy - Monitor intermittently.   Small  cell lung cancer: Recently started palliative chemotherapy per Dr. Julien Nordmann, reportedly tolerating this well. - Port placement has been delayed pending imaging, per pt. Had PICC placed 8/14.  - PET scan scheduled  8/16.  - Dr. Julien Nordmann is aware of admission and will come see the patient.  Acute kidney injury on stage III CKD: SCr 1.9 on admission, up from baseline of 1.3. Mildly improved.  - Monitor BMP daily with diuresis - Avoid nephrotoxins  T2DM: HbA1c 7.1%, but significant hyperglycemia in setting of steroids. PCP recently stated low dose basaglar 5u.  - Holding [facility] and long-acting insulin for now, on SSI  Essential HTN: Presented with hyPOtension, since increased.   - Restart bidil - Continuing beta blocker, increased per cardiology.   Anxiety:  - Continue home clonazepam  GERD: Chronic, stable - Continue PPI  DVT prophylaxis: SCDs Code Status: DNR Family Communication: Wife at bedside Disposition Plan: Uncertain, will need significant diuresis.   Consultants:   Cardiology, Dr. Meda Coffee  Oncology, Dr. Julien Nordmann  Procedures:   Dual lumen PICC RUE 8/14  Antimicrobials:  None   Subjective: Feels better, breathing much better. No appreciable improvement in abdominal or leg swelling. Says he's urinating a lot. Very grouchy about the many interventions (insulin, lovenox, PICC line, labs, etc.). No exertional chest pain or fevers.  Objective: Vitals:   12/04/16 1736 12/04/16 2228 12/05/16 0602 12/05/16 0935  BP: 137/67 (!) 160/71 (!) 147/66   Pulse: 80 88 83 87  Resp:  18 18 18   Temp:  98.6 F (37 C) 97.8 F (36.6 C)   TempSrc:  Oral Oral   SpO2: (!) 87% 93% 97% 98%  Weight:   94.8 kg (209 lb)   Height:   6' (1.829 m)     Intake/Output Summary (Last 24 hours) at 12/05/16 1218 Last data filed at 12/05/16 1115  Gross per 24 hour  Intake            441.3 ml  Output             2250 ml  Net          -1808.7 ml   Filed Weights   12/04/16 0008 12/04/16 0500 12/05/16 0602  Weight: 94.8 kg (209 lb) 94.8 kg (208 lb 15.9 oz) 94.8 kg (209 lb)   Examination: General exam: Chronically ill-appearing 65 y.o. male in no distress Respiratory system: Non-labored on supplemental  oxygen, Bilateral crackles at bases, no wheezing noted today. Cardiovascular system: Regular rate and rhythm. No murmur, rub, or gallop. + JVD. Gastrointestinal system: Abdomen protuberant, soft, non-tender, normoactive bowel sounds. Central nervous system: Alert and oriented. No focal neurological deficits. Extremities: Warm, no deformities. Legs with 3+ pitting edema below knees. No hair growth on lower legs/feet. Dual lumen PICC RUE, site c/d/i Skin: No rashes, lesions no ulcers Psychiatry: Judgement and insight appear normal. Mood & affect appropriate.   Data Reviewed: I have personally reviewed following labs and imaging studies  CBC:  Recent Labs Lab 12/03/16 1855 12/04/16 1020 12/05/16 0242  WBC 10.6* 7.1 4.3  NEUTROABS 10.1*  --   --   HGB 12.7* 12.4* 12.2*  HCT 38.7* 37.0* 37.0*  MCV 94.9 93.0 93.0  PLT 79* 69* 58*   Basic Metabolic Panel:  Recent Labs Lab 12/03/16 1855 12/04/16 1020 12/05/16 0242  NA 137 140 140  K 4.4 4.2 4.8  CL 100* 102 102  CO2 25 26 27   GLUCOSE 206* 231* 253*  BUN 58* 60* 63*  CREATININE 1.80*  1.90* 1.81*  CALCIUM 8.3* 8.3* 8.1*   GFR: Estimated Creatinine Clearance: 47.3 mL/min (A) (by C-G formula based on SCr of 1.81 mg/dL (H)). Liver Function Tests:  Recent Labs Lab 12/03/16 1855  AST 207*  ALT 100*  ALKPHOS 176*  BILITOT 5.7*  PROT 6.0*  ALBUMIN 2.5*   Cardiac Enzymes:  Recent Labs Lab 12/03/16 1855 12/04/16 1020 12/05/16 0242  TROPONINI 0.76* 0.95* 0.84*   CBG:  Recent Labs Lab 12/04/16 1135 12/04/16 1730 12/04/16 2229 12/05/16 0803 12/05/16 1154  GLUCAP 230* 159* 139* 161* 241*   Urine analysis:    Component Value Date/Time   COLORURINE AMBER (A) 12/03/2016 2003   APPEARANCEUR CLOUDY (A) 12/03/2016 2003   APPEARANCEUR Clear 03/15/2016 0853   LABSPEC 1.014 12/03/2016 2003   PHURINE 5.0 12/03/2016 2003   GLUCOSEU NEGATIVE 12/03/2016 2003   HGBUR LARGE (A) 12/03/2016 2003   BILIRUBINUR NEGATIVE  12/03/2016 2003   BILIRUBINUR Negative 03/15/2016 [place] 12/03/2016 2003   PROTEINUR 30 (A) 12/03/2016 2003   NITRITE NEGATIVE 12/03/2016 2003   LEUKOCYTESUR TRACE (A) 12/03/2016 2003   LEUKOCYTESUR Negative 03/15/2016 0853   Recent Results (from the past 240 hour(s))  MRSA PCR Screening     Status: Abnormal   Collection Time: 12/04/16 12:15 AM  Result Value Ref Range Status   MRSA by PCR POSITIVE (A) NEGATIVE Final    Comment:        The GeneXpert MRSA Assay (FDA approved for NASAL specimens only), is one component of a comprehensive MRSA colonization surveillance program. It is not intended to diagnose MRSA infection nor to guide or monitor treatment for MRSA infections. RESULT CALLED TO, READ BACK BY AND VERIFIED WITH: Ronnald Ramp AT 0159 ON 08.13.2018 BY NBROOKS       Radiology Studies: Dg Chest 2 View  Result Date: 12/03/2016 CLINICAL DATA:  66 year old male recently diagnosis small cell lung cancer. Shortness of breath with cough and congestion for the past 3-4 weeks. EXAM: CHEST  2 VIEW COMPARISON:  Chest x-ray 11/29/2016. FINDINGS: Chronic volume loss and consolidation in the left upper lobe again noted. Mass-like area in the left hilar region, corresponding to known lymphadenopathy and left upper low mass. Right lung is clear. Small left pleural effusion. No evidence of pulmonary edema. Heart size is normal. Aortic atherosclerosis. IMPRESSION: 1. No acute radiographic abnormality of the chest. The appearance of the chest is very similar to prior studies, with known left upper lobe mass, left hilar lymphadenopathy and extensive airspace consolidation and volume loss in the left upper lobe, presumably related to central obstruction. 2. Aortic atherosclerosis. Electronically Signed   By: Vinnie Langton M.D.   On: 12/03/2016 21:11     LOS: 2 days    Time spent: 25 minutes.  Vance Gather, MD [facility] Pager [phone]  If 7PM-7AM, please  contact night-coverage [email or web address] Password [facility] 12/05/2016, 12:18 PM

## 2016-12-05 NOTE — Progress Notes (Signed)
Pt and wife is interested in [place] when going home. [facility] was selected for [facility]. Will continue to following for discharge needs.

## 2016-12-05 NOTE — Progress Notes (Signed)
PT Cancellation Note  Patient Details Name: Kevin Gutierrez MRN: [id number] DOB: 06-14-1950   Cancelled Treatment:    Reason Eval/Treat Not Completed: Patient at procedure or test/unavailable. Will check back later.    Weston Anna, MPT Pager: [phone]

## 2016-12-05 NOTE — Progress Notes (Signed)
ANTICOAGULATION CONSULT NOTE - Follow Up Consult  Pharmacy Consult for Heparin Indication: chest pain/ACS  Allergies  Allergen Reactions  . No Known Allergies     Patient Measurements: Height: 6' (182.9 cm) Weight: 208 lb 15.9 oz (94.8 kg) IBW/kg (Calculated) : 77.6 Heparin Dosing Weight:   Vital Signs: Temp: 98.6 F (37 C) (08/13 2228) Temp Source: Oral (08/13 2228) BP: 160/71 (08/13 2228) Pulse Rate: 88 (08/13 2228)  Labs:  Recent Labs  12/03/16 1855 12/04/16 1020  HGB 12.7* 12.4*  HCT 38.7* 37.0*  PLT 79* 69*  CREATININE 1.80* 1.90*  TROPONINI 0.76* 0.95*    Estimated Creatinine Clearance: 45.1 mL/min (A) (by C-G formula based on SCr of 1.9 mg/dL (H)).   Medications:  Infusions:  . heparin 1,100 Units/hr (12/04/16 1542)    Assessment: Patient with heparin drip for ACS.  Per RN, patient refusing lab draws until PICC is placed sometime 8/14 AM.  Notified Dr. Olevia Bowens.  Dr. Olevia Bowens wishes to continue heparin drip at this time and follow with all labs after PICC placed.  Goal of Therapy:  Heparin level 0.3-0.7 units/ml Monitor platelets by anticoagulation protocol: Yes   Plan:  Follow up with PICC placement and reschedule of labs  Nani Skillern Crowford 12/05/2016,1:11 AM

## 2016-12-05 NOTE — Consult Note (Signed)
   [facility] Inpatient Consult   12/05/2016  RAYGEN DAHM [birth year] [id number]    [facility] Care Management referral received for CHF disease and symptom management. Spoke with inpatient RNCM about referral.  Patient working with physical therapy. Will follow up to discuss potential [facility] services.    Marthenia Rolling, MSN-Ed, RN,BSN [facility] Liaison [phone]

## 2016-12-05 NOTE — Progress Notes (Signed)
Peripherally Inserted Central Catheter/Midline Placement  The IV Nurse has discussed with the patient and/or persons authorized to consent for the patient, the purpose of this procedure and the potential benefits and risks involved with this procedure.  The benefits include less needle sticks, lab draws from the catheter, and the patient may be discharged home with the catheter. Risks include, but not limited to, infection, bleeding, blood clot (thrombus formation), and puncture of an artery; nerve damage and irregular heartbeat and possibility to perform a PICC exchange if needed/ordered by physician.  Alternatives to this procedure were also discussed.  Bard Power PICC patient education guide, fact sheet on infection prevention and patient information card has been provided to patient /or left at bedside.    PICC/Midline Placement Documentation        Synthia Innocent 12/05/2016, 10:55 AM

## 2016-12-05 NOTE — Progress Notes (Signed)
Progress Note  Patient Name: Kevin Gutierrez Date of Encounter: 12/05/2016  Primary Cardiologist: Dr. Anabel Bene  Subjective   The patient is finally feeling better.   Inpatient Medications    Scheduled Meds: . aspirin EC  81 mg Oral Daily  . atorvastatin  20 mg Oral q1800  . Chlorhexidine Gluconate Cloth  6 each Topical Q0600  . furosemide  40 mg Intravenous BID  . insulin aspart  0-5 Units Subcutaneous QHS  . insulin aspart  0-9 Units Subcutaneous TID WC  . metoprolol tartrate  25 mg Oral BID  . mupirocin ointment  1 application Nasal BID  . nicotine  7 mg Transdermal Daily  . pantoprazole  40 mg Oral Daily  . potassium chloride  20 mEq Oral BID  . spironolactone  25 mg Oral Daily  . tiotropium  18 mcg Inhalation Daily   Continuous Infusions: . heparin 1,100 Units/hr (12/04/16 1542)   PRN Meds: albuterol, clonazePAM, HYDROcodone-acetaminophen, ondansetron **OR** ondansetron (ZOFRAN) IV   Vital Signs    Vitals:   12/04/16 1050 12/04/16 1736 12/04/16 2228 12/05/16 0602  BP: (!) 134/57 137/67 (!) 160/71 (!) 147/66  Pulse: 85 80 88 83  Resp:   18 18  Temp:   98.6 F (37 C) 97.8 F (36.6 C)  TempSrc:   Oral Oral  SpO2:  (!) 87% 93% 97%  Weight:    209 lb (94.8 kg)  Height:    6' (1.829 m)    Intake/Output Summary (Last 24 hours) at 12/05/16 0814 Last data filed at 12/05/16 2542  Gross per 24 hour  Intake            517.3 ml  Output             2350 ml  Net          -1832.7 ml   Filed Weights   12/04/16 0008 12/04/16 0500 12/05/16 0602  Weight: 209 lb (94.8 kg) 208 lb 15.9 oz (94.8 kg) 209 lb (94.8 kg)    Telemetry    SR, 2 short episodes of atrial tachycardia - Personally Reviewed  ECG    No new tracings - Personally Reviewed  Physical Exam   GEN: No acute distress.   Neck: No JVD Cardiac: RRR, no murmurs, rubs, or gallops.  Respiratory: crackles bilaterally. GI: Soft, nontender, non-distended  MS: B/L pitting +2 edema; No deformity. Neuro:   Nonfocal  Psych: Normal affect   Labs    Chemistry Recent Labs Lab 12/03/16 1855 12/04/16 1020 12/05/16 0242  NA 137 140 140  K 4.4 4.2 4.8  CL 100* 102 102  CO2 25 26 27   GLUCOSE 206* 231* 253*  BUN 58* 60* 63*  CREATININE 1.80* 1.90* 1.81*  CALCIUM 8.3* 8.3* 8.1*  PROT 6.0*  --   --   ALBUMIN 2.5*  --   --   AST 207*  --   --   ALT 100*  --   --   ALKPHOS 176*  --   --   BILITOT 5.7*  --   --   GFRNONAA 37* 35* 37*  GFRAA 43* 40* 43*  ANIONGAP 12 12 11      Hematology Recent Labs Lab 12/03/16 1855 12/04/16 1020 12/05/16 0242  WBC 10.6* 7.1 4.3  RBC 4.08* 3.98* 3.98*  HGB 12.7* 12.4* 12.2*  HCT 38.7* 37.0* 37.0*  MCV 94.9 93.0 93.0  MCH 31.1 31.2 30.7  MCHC 32.8 33.5 33.0  RDW 17.7* 17.4* 17.6*  PLT  79* 69* 58*    Cardiac Enzymes Recent Labs Lab 12/03/16 1855 12/04/16 1020 12/05/16 0242  TROPONINI 0.76* 0.95* 0.84*   No results for input(s): TROPIPOC in the last 168 hours.   BNP Recent Labs Lab 12/03/16 1855  BNP 435.1*     DDimer No results for input(s): DDIMER in the last 168 hours.   Radiology    Dg Chest 2 View  Result Date: 12/03/2016 CLINICAL DATA:  66 year old male recently diagnosis small cell lung cancer. Shortness of breath with cough and congestion for the past 3-4 weeks. EXAM: CHEST  2 VIEW COMPARISON:  Chest x-ray 11/29/2016. FINDINGS: Chronic volume loss and consolidation in the left upper lobe again noted. Mass-like area in the left hilar region, corresponding to known lymphadenopathy and left upper low mass. Right lung is clear. Small left pleural effusion. No evidence of pulmonary edema. Heart size is normal. Aortic atherosclerosis. IMPRESSION: 1. No acute radiographic abnormality of the chest. The appearance of the chest is very similar to prior studies, with known left upper lobe mass, left hilar lymphadenopathy and extensive airspace consolidation and volume loss in the left upper lobe, presumably related to central obstruction.  2. Aortic atherosclerosis. Electronically Signed   By: Vinnie Langton M.D.   On: 12/03/2016 21:11    Cardiac Studies   Left Heart catheterization 11/07/16 Conclusion     Mid RCA-2 lesion, 30 %stenosed.  Mid RCA-1 lesion, 95 %stenosed.  Dist RCA lesion, 55 %stenosed.  Mid LAD lesion, 40 %stenosed.  Mid LAD to Dist LAD lesion, 60 %stenosed.  Dist LAD lesion, 70 %stenosed.  Prox Cx to Mid Cx lesion, 70 %stenosed.  Dist Cx lesion, 70 %stenosed.  2nd Mrg lesion, 70 %stenosed.  The left ventricular ejection fraction is 45-50% by visual estimate.  There is mild left ventricular systolic dysfunction.  LV end diastolic pressure is moderately elevated.  Mild LV dysfunction with inferior hypo-kinesis in a global ejection fraction at 45-50%.  Significant multivessel CAD with [place] 40% LAD stenosis with 60-70% mid LAD stenosis; segmental 70% stenosis in the proximal circumflex extending into the on 2 vessel; and high-grade 95% focal mid RCA stenosis with 30% followed by 50-60% stenosis prior to the PDA takeoff.  RECOMMENDATION: The patient is undergoing current evaluation of a lung mass which is felt possibly due to lung cancer. He will require biopsying. Angiograms were reviewed with Dr. Sallyanne Kuster. Percutaneous coronary Intervention or CABG revascularization surgery will be considered following pulmonary workup.        Echocardiogram 11/02/16 Study Conclusions - Left ventricle: The cavity size was normal. There was mild concentric hypertrophy. Systolic function was moderately reduced. The estimated ejection fraction was in the range of 35% to 40%. Diffuse hypokinesis. Doppler parameters are consistent with abnormal left ventricular relaxation (grade 1 diastolic dysfunction). - Right atrium: The atrium was mildly dilated. - Pulmonary arteries: Systolic pressure could not be accurately estimated. - Inferior vena cava: The vessel was dilated. Respirophasic  changes in dimension were absent, consistent with elevated central venous pressure. - Pericardium, extracardiac: Possible left pleural effusion    Patient Profile     66 y.o. male recent diagnosis of small cell lung cancer, HTN, HLD, COPD, CHF, and stage 3 CKD. Admitted for worsening shortness of breath, edema and wt gain.  Assessment & Plan    Acute on chronic systolic and diastolic heart failure -Reduced EF noted in July, 35-40% -LHC found significant multivessel CAD. Intervention was deferred until complete workup for lung cancer was complete -Patient  presented with worsening dyspnea, edema, orthopnea and fatigue. Approximate 10 pound weight gain -Chest x-ray showed no edema. BNP was 435 -Currently on IV Lasix 40 mg twice a day. Spironolactone was added -negative 2.9 L fluid balance. Weight is unchanged -Continue to diurese. Adjust outpatient diuretics  CAD -LHC in 10/2016 pound significant multivessel disease. In setting of lung cancer and poor prognosis, conservative therapy was elected. To reevaluate once full cancer workup is complete and it is known whether patient can take antiplatelet therapy. -No significant chest pain -continue aspirin, statin, beta blocker -Consider clopidogrel if treatment plan can accommodate  Small cell lung cancer -Followed by Dr. Earlie Server. On palliative chemotherapy. No metastasis noted on MRI. Awaiting PET scan for further evaluation.  Hypertension -Blood pressure is now mildly elevated. Consider resuming BiDil  CKD stage II - serum creatinine is elevated at 1.8  (baseline 1.3 ), likely cardiorenal -Continue to monitor renal function with diuresis, may improve with improved fluid status  COPD/chronic respiratory insufficiency -On home oxygen with recent increased oxygen demands. Hopefully will improve with diuresis -Management per internal medicine   Signed, Daune Perch, NP  12/05/2016, 8:14 AM    The patient was seen, examined  and discussed with Daune Perch, NP-C and I agree with the above.   The patient finally feels better with SOB, currently asymptomatic, diuresed - 1.8 L overnight, weight the same at 209, baseline 194 lbs, still significant fluid overload, crea 1.8 from 1.9 yesterday, baseline 1.3.  We will increase lasix to 40 iv q6h. He is hypertensive and tachycardic with episodes of atrial tachycardia, I will increase his netoprolol to 50 mg po BID.   Ena Dawley, MD 12/05/2016

## 2016-12-05 NOTE — Progress Notes (Signed)
Subjective: The patient is seen and examined today. He is feeling fine today with no specific complaints except for the swelling of the lower extremities. He was recently diagnosed with extensive stage small cell lung cancer and started systemic chemotherapy with carboplatin and etoposide last week. He tolerated the first week of his treatment fairly well with no significant adverse effects. He denied having any fever or chills. He has no nausea, vomiting, diarrhea or constipation. He continues to have shortness breath with exertion but no significant chest pain or hemoptysis. He was admitted to [facility] on 12/03/2016 complaining of leg swelling as well as shortness of breath and hypotension. He has a history of congestive heart failure. He is feeling much better today.  Objective: Vital signs in last 24 hours: Temp:  [97.8 F (36.6 C)-98.6 F (37 C)] 97.8 F (36.6 C) (08/14 0602) Pulse Rate:  [80-88] 87 (08/14 0935) Resp:  [18] 18 (08/14 0935) BP: (137-160)/(66-71) 147/66 (08/14 0602) SpO2:  [87 %-98 %] 98 % (08/14 0935) Weight:  [209 lb (94.8 kg)] 209 lb (94.8 kg) (08/14 0602)  Intake/Output from previous day: 08/13 0701 - 08/14 0700 In: 517.3 [P.O.:360; I.V.:157.3] Out: 2650 [Urine:2650] Intake/Output this shift: Total I/O In: 44 [I.V.:44] Out: 450 [Urine:450]  General appearance: alert, cooperative, fatigued and no distress Resp: wheezes bilaterally Cardio: regular rate and rhythm, S1, S2 normal, no murmur, click, rub or gallop GI: soft, non-tender; bowel sounds normal; no masses,  no organomegaly Extremities: edema 2+ bilaterally  Lab Results:   Recent Labs  12/04/16 1020 12/05/16 0242  WBC 7.1 4.3  HGB 12.4* 12.2*  HCT 37.0* 37.0*  PLT 69* 58*   BMET  Recent Labs  12/04/16 1020 12/05/16 0242  NA 140 140  K 4.2 4.8  CL 102 102  CO2 26 27  GLUCOSE 231* 253*  BUN 60* 63*  CREATININE 1.90* 1.81*  CALCIUM 8.3* 8.1*    Studies/Results: Dg Chest 2  View  Result Date: 12/03/2016 CLINICAL DATA:  66 year old male recently diagnosis small cell lung cancer. Shortness of breath with cough and congestion for the past 3-4 weeks. EXAM: CHEST  2 VIEW COMPARISON:  Chest x-ray 11/29/2016. FINDINGS: Chronic volume loss and consolidation in the left upper lobe again noted. Mass-like area in the left hilar region, corresponding to known lymphadenopathy and left upper low mass. Right lung is clear. Small left pleural effusion. No evidence of pulmonary edema. Heart size is normal. Aortic atherosclerosis. IMPRESSION: 1. No acute radiographic abnormality of the chest. The appearance of the chest is very similar to prior studies, with known left upper lobe mass, left hilar lymphadenopathy and extensive airspace consolidation and volume loss in the left upper lobe, presumably related to central obstruction. 2. Aortic atherosclerosis. Electronically Signed   By: Vinnie Langton M.D.   On: 12/03/2016 21:11    Medications: I have reviewed the patient's current medications.  CODE STATUS: No CODE BLUE  Assessment/Plan: This is a very pleasant 66 years old white male recently diagnosed with extensive stage small cell lung cancer and the started the first cycle of his systemic chemotherapy with carboplatin and etoposide last week. He tolerated the first week of his treatment well. The patient is expected to have signs of myelosuppression that may get worse in the next few days. He currently has thrombocytopenia and mild anemia that total white blood count is still within the normal range. He received Neulasta injection after his chemotherapy. I recommended for the patient to continue with supportive  care for now. We will continue to monitor his blood count closely and consider the patient for platelet transfusion the platelet count is less than 20,000 or he has any significant bleeding issues. For the congestive heart failure, the patient is currently on treatment with  furosemide. For the history of diabetes mellitus and hypertension, the patient will continue his current medications and management by the primary care team. I will arrange for the patient follow-up appointment with me after discharge for reevaluation of his condition and monitoring of his blood count. Thank you so much for taking good care of Kevin Gutierrez, I will continue to follow up the patient with you and assist in his management on as-needed basis.    LOS: 2 days    Donye Dauenhauer K. 12/05/2016

## 2016-12-05 NOTE — Consult Note (Signed)
   [facility] Inpatient Consult   12/05/2016  JAYAN RAYMUNDO 1951-04-05 [id number]    [facility] Care Management follow up.   Spoke extensively with Mr. Raz and wife, Alyssa Mancera, at bedside. Discussed [facility] Care Management program. Also discussed their interest in outpatient palliative services, not hospice. Mr. Asato indicates he wants to purse chemotherapy.   They live in [place] and inpatient RNCM will cross check on what outpatient palliative care program will available to Mr. Rodin in [place].  In the meantime, written consent signed for [place] Management program services for follow up and coordination of care if needed. Discussed that [place] Management will not follow if palliative services were in place due to duplication of services. Both patient and wife express understanding of this.   Mrs. Paci reports she will be primary contact person for her husband. Darlina Sicilian [phone].  Explained that [facility] Management will not interfere or replace services provided by home health. Made them aware that there are co-pays associated with home health. Mrs. Coppedge indicates they have limited resources. Mrs.Savo expressed that she is overwhelmed and states she could would like having a resource of knowing what to do when they get home.   Mr. Bialecki recently diagnosed with small cell lung cancer and has history of COPD, HTN, and newly dx CHF.   Support and encouragement offered.  [facility] Care Management packet provided. Confirmed Primary Care MD is Dr. Lacinda Axon.   Both patient and wife appreciative of visit.   Discussed all of the above with inpatient RNCM. Will continue to follow and make appropriate [place] referral after confirming discharge plan with inpatient RNCM at later time.   Marthenia Rolling, MSN-Ed, RN,BSN [facility] Liaison [phone]

## 2016-12-05 NOTE — Evaluation (Signed)
Physical Therapy Evaluation Patient Details Name: Kevin Gutierrez MRN: [id number] DOB: 15-Apr-1951 Today's Date: 12/05/2016    SATURATION QUALIFICATIONS: (This note is used to comply with regulatory documentation for home oxygen)  Patient Saturations on Room Air at Rest = 85%  Patient Saturations on Room Air while Ambulating = N/A  Patient Saturations on 4 Liters of oxygen while Ambulating = 93% (pt is O2 dep-usually wears 2.5L at baseline)      History of Present Illness  66 yo male admitted with CHF, LE edema. Hx of SCLC, COPD, HTN, DM, CHF, CKD  Clinical Impression  On eval, pt required Min assist for mobility. He walked ~115 feet while holding on to IV pole and hallway handrail-pt declined RW use during eval. Pt presents with general weakness, decreased activity tolerance, and impaired gait and balance. Discussed d/c plan-pt plans to return home with family. He is agreeable to HHPT f/u. He had questions about hospice care so he may benefit from a consultation if possible.     Follow Up Recommendations Home health PT;Supervision/Assistance - 24 hour    Equipment Recommendations  None recommended by PT    Recommendations for Other Services       Precautions / Restrictions Precautions Precautions: Fall Precaution Comments: monitor O2 sats Restrictions Weight Bearing Restrictions: No      Mobility  Bed Mobility               General bed mobility comments: sitting EOB  Transfers Overall transfer level: Needs assistance Equipment used: Rolling walker (2 wheeled) Transfers: Sit to/from Stand Sit to Stand: Min guard         General transfer comment: close guard for safety.   Ambulation/Gait Ambulation/Gait assistance: Min assist Ambulation Distance (Feet): 115 Feet Assistive device:  (IV pole) Gait Pattern/deviations: Step-through pattern;Decreased stride length;Drifts right/left     General Gait Details: Unsteady. Pt held on to IV pole but he also had to  use hallway handrail for support. O2 sat 93% on 4L O2.   Stairs            Wheelchair Mobility    Modified Rankin (Stroke Patients Only)       Balance                                             Pertinent Vitals/Pain Pain Assessment: No/denies pain    Home Living Family/patient expects to be discharged to:: Private residence Living Arrangements: Spouse/significant other Available Help at Discharge: Family;Available 24 hours/day Type of Home: Apartment Home Access: Level entry     Home Layout: One level Home Equipment: Walker - 2 wheels;Transport chair;Wheelchair - manual;Shower seat      Prior Function Level of Independence: Independent               Hand Dominance        Extremity/Trunk Assessment   Upper Extremity Assessment Upper Extremity Assessment: Overall WFL for tasks assessed    Lower Extremity Assessment Lower Extremity Assessment: Generalized weakness    Cervical / Trunk Assessment Cervical / Trunk Assessment: Normal  Communication   Communication: No difficulties  Cognition Arousal/Alertness: Awake/alert Behavior During Therapy: WFL for tasks assessed/performed Overall Cognitive Status: Within Functional Limits for tasks assessed  General Comments      Exercises     Assessment/Plan    PT Assessment Patient needs continued PT services  PT Problem List Decreased mobility;Decreased strength;Decreased activity tolerance;Decreased balance;Decreased knowledge of use of DME       PT Treatment Interventions DME instruction;Gait training;Therapeutic activities;Therapeutic exercise;Patient/family education;Balance training;Functional mobility training    PT Goals (Current goals can be found in the Care Plan section)  Acute Rehab PT Goals Patient Stated Goal: home soon PT Goal Formulation: With patient/family Time For Goal Achievement: 12/19/16 Potential to  Achieve Goals: Good    Frequency Min 3X/week   Barriers to discharge        Co-evaluation               AM-PAC PT "6 Clicks" Daily Activity  Outcome Measure Difficulty turning over in bed (including adjusting bedclothes, sheets and blankets)?: A Little Difficulty moving from lying on back to sitting on the side of the bed? : A Little Difficulty sitting down on and standing up from a chair with arms (e.g., wheelchair, bedside commode, etc,.)?: A Little Help needed moving to and from a bed to chair (including a wheelchair)?: A Little Help needed walking in hospital room?: A Little Help needed climbing 3-5 steps with a railing? : A Little 6 Click Score: 18    End of Session Equipment Utilized During Treatment: Oxygen Activity Tolerance: Patient tolerated treatment well Patient left: in bed;with call bell/phone within reach;with family/visitor present   PT Visit Diagnosis: Muscle weakness (generalized) (M62.81);Difficulty in walking, not elsewhere classified (R26.2)    Time: 1450-1505 PT Time Calculation (min) (ACUTE ONLY): 15 min   Charges:   PT Evaluation $PT Eval Low Complexity: 1 Low     PT G Codes:          Weston Anna, MPT Pager: [phone]

## 2016-12-05 NOTE — Progress Notes (Signed)
[place] for Lovenox Indication: chest pain/ACS  Allergies  Allergen Reactions  . No Known Allergies    Patient Measurements: Height: 6' (182.9 cm) Weight: 209 lb (94.8 kg) IBW/kg (Calculated) : 77.6 Heparin Dosing Weight: 94.8kg  Vital Signs: Temp: 97.8 F (36.6 C) (08/14 0602) Temp Source: Oral (08/14 0602) BP: 147/66 (08/14 0602) Pulse Rate: 87 (08/14 0935)  Labs:  Recent Labs  12/03/16 1855 12/04/16 1020 12/05/16 0242  HGB 12.7* 12.4* 12.2*  HCT 38.7* 37.0* 37.0*  PLT 79* 69* 58*  HEPARINUNFRC  --   --  <0.10*  CREATININE 1.80* 1.90* 1.81*  TROPONINI 0.76* 0.95* 0.84*   Estimated Creatinine Clearance: 47.3 mL/min (A) (by C-G formula based on SCr of 1.81 mg/dL (H)).  Medical History: Past Medical History:  Diagnosis Date  . Chicken pox   . COPD (chronic obstructive pulmonary disease) ([place])   . History of kidney stones 05/17/2015  . History of stomach ulcers 05/17/2015  . Hypertension   . Kidney stones   . Lung cancer ([place])   . Nephrolithiasis   . Peptic ulcer disease   . Vitamin D deficiency    Assessment: 66 y.o. male with a hx of HTN, HLD, COPD and stage 3 CKD.  Patient has recent diagnosis of small cell lung cancer on palliative chemo.   In the last few days, he is having worsening generalized fatigue, markedly increased leg edema and abdominal swelling, decreased urine response to Lasix, and worsening dyspnea, requiring higher flow of O2 at home. Pharmacy consulted to dose heparin drip for ACS/STEMI.   Today, 12/05/2016 - Heparin bolus, infusion started ~ 1600 yesterday 8/13, pt unwilling to allow peripheral lab draws - PICC line ordered 8/13, patient refused placement at that time, consider 8/14 placement - Heparin level ordered 8/13 pm refused, but apparently a level was drawn ~ 0300 this am, but pharmacy unaware lab begin drawn - PICC line placed this am, but unfortunately lost peripheral IV access, unable to  infuse Heparin thru PICC and obtain any meaningful lab values from sample drawn from PICC - patient can tolerate insulin injections, will change to Lovenox for today, await further Cards work-up - Platelets decreased from chemotherapy, monitor closely  Goal of Therapy:  Monitor platelets by anticoagulation protocol   Plan:   Stop Heparin infusion, begin Lovenox 100mg  SQ bid one hour after Heparin off  Daily CBC   Await further Cards work-up, duration of Lovenox   Minda Ditto PharmD Pager [phone] 12/05/2016, 12:06 PM

## 2016-12-05 NOTE — Telephone Encounter (Signed)
Per Pharmacy Trelegy Ellipta not on formulary , Advair preferred Thanks.

## 2016-12-06 LAB — COMPREHENSIVE METABOLIC PANEL
ALT: 82 U/L — AB (ref 17–63)
AST: 102 U/L — ABNORMAL HIGH (ref 15–41)
Albumin: 2.2 g/dL — ABNORMAL LOW (ref 3.5–5.0)
Alkaline Phosphatase: 184 U/L — ABNORMAL HIGH (ref 38–126)
Anion gap: 8 (ref 5–15)
BILIRUBIN TOTAL: 6.2 mg/dL — AB (ref 0.3–1.2)
BUN: 47 mg/dL — ABNORMAL HIGH (ref 6–20)
CO2: 32 mmol/L (ref 22–32)
Calcium: 8.2 mg/dL — ABNORMAL LOW (ref 8.9–10.3)
Chloride: 101 mmol/L (ref 101–111)
Creatinine, Ser: 1.47 mg/dL — ABNORMAL HIGH (ref 0.61–1.24)
GFR calc Af Amer: 55 mL/min — ABNORMAL LOW (ref >60)
GFR, EST NON AFRICAN AMERICAN: 48 mL/min — AB (ref >60)
Glucose, Bld: 193 mg/dL — ABNORMAL HIGH (ref 65–99)
Potassium: 4.2 mmol/L (ref 3.5–5.1)
Sodium: 141 mmol/L (ref 135–145)
Total Protein: 5.6 g/dL — ABNORMAL LOW (ref 6.5–8.1)

## 2016-12-06 LAB — GLUCOSE, CAPILLARY
GLUCOSE-CAPILLARY: 199 mg/dL — AB (ref 65–99)
GLUCOSE-CAPILLARY: 180 mg/dL — AB (ref 65–99)
GLUCOSE-CAPILLARY: 346 mg/dL — AB (ref 65–99)
GLUCOSE-CAPILLARY: 140 mg/dL — AB (ref 65–99)

## 2016-12-06 LAB — CBC
HEMATOCRIT: 34.7 % — AB (ref 39.0–52.0)
HEMOGLOBIN: 11.5 g/dL — AB (ref 13.0–17.0)
MCH: 30.9 pg (ref 26.0–34.0)
MCHC: 33.1 g/dL (ref 30.0–36.0)
MCV: 93.3 fL (ref 78.0–100.0)
PLATELETS: 41 10*3/uL — AB (ref 150–400)
RBC: 3.72 MIL/uL — AB (ref 4.22–5.81)
RDW: 17.7 % — ABNORMAL HIGH (ref 11.5–15.5)
WBC: 1.5 10*3/uL — AB (ref 4.0–10.5)

## 2016-12-06 MED ORDER — FLUTICASONE FUROATE-VILANTEROL 100-25 MCG/INH IN AEPB
1.0000 | INHALATION_SPRAY | Freq: Every day | RESPIRATORY_TRACT | Status: DC
  Filled 2016-12-06: qty 28

## 2016-12-06 MED ORDER — ENOXAPARIN SODIUM 100 MG/ML ~~LOC~~ SOLN
1.0000 mg/kg | Freq: Two times a day (BID) | SUBCUTANEOUS | Status: DC
  Filled 2016-12-06: qty 1

## 2016-12-06 NOTE — Telephone Encounter (Signed)
Stay on the [facility]. Trelegy not approved.

## 2016-12-06 NOTE — Telephone Encounter (Signed)
Patients wife advised of below , she wants me to try to do prior authorization for Trelegy Ellipta.

## 2016-12-06 NOTE — Progress Notes (Signed)
Progress Note  Patient Name: Kevin Gutierrez Date of Encounter: 12/06/2016  Primary Cardiologist: Dr. Anabel Bene  Subjective   The patient is feeling much better. He still has some shortness of breath with exertion, but denies orthopnea. His edema is much improved. His wife tells me that she has been encouraging fluid intake and giving him Pedialyte as was discussed in the cancer class.  The patient wants to go home today as he has [facility]   Inpatient Medications    Scheduled Meds: . aspirin EC  81 mg Oral Daily  . atorvastatin  20 mg Oral q1800  . Chlorhexidine Gluconate Cloth  6 each Topical Q0600  . enoxaparin (LOVENOX) injection  100 mg Subcutaneous BID  . furosemide  40 mg Intravenous Q6H  . insulin aspart  0-5 Units Subcutaneous QHS  . insulin aspart  0-9 Units Subcutaneous TID WC  . isosorbide-hydrALAZINE  1 tablet Oral TID  . metoprolol tartrate  50 mg Oral BID  . mupirocin ointment  1 application Nasal BID  . nicotine  7 mg Transdermal Daily  . pantoprazole  40 mg Oral Daily  . potassium chloride  20 mEq Oral BID  . spironolactone  25 mg Oral Daily  . tiotropium  18 mcg Inhalation Daily   Continuous Infusions:  PRN Meds: albuterol, clonazePAM, HYDROcodone-acetaminophen, ondansetron **OR** ondansetron (ZOFRAN) IV, sodium chloride flush   Vital Signs    Vitals:   12/05/16 0935 12/05/16 1426 12/05/16 2232 12/06/16 0642  BP:  125/74 118/75 113/75  Pulse: 87 72 98 91  Resp: 18 18 18 16   Temp:  98 F (36.7 C) 98 F (36.7 C) 98.3 F (36.8 C)  TempSrc:  Oral Oral Oral  SpO2: 98% (!) 83% 94% 94%  Weight:    198 lb (89.8 kg)  Height:        Intake/Output Summary (Last 24 hours) at 12/06/16 0751 Last data filed at 12/06/16 0600  Gross per 24 hour  Intake              164 ml  Output             2100 ml  Net            -1936 ml   Filed Weights   12/04/16 0500 12/05/16 0602 12/06/16 0642  Weight: 208 lb 15.9 oz  (94.8 kg) 209 lb (94.8 kg) 198 lb (89.8 kg)    Telemetry    Sinus rhythm in the 80's-90's - Personally Reviewed  ECG    No new tracings - Personally Reviewed  Physical Exam  Physical Exam  Constitutional: No distress.  Chronically ill appearing male  HENT:  Head: Normocephalic and atraumatic.  Neck: Normal range of motion. Neck supple. No JVD present.  Cardiovascular: Normal rate and regular rhythm.  Exam reveals no gallop and no friction rub.   No murmur heard. Pulmonary/Chest: He has no rales.  Few scattered rales in the posterior bases  Abdominal: Soft. Bowel sounds are normal. He exhibits no distension.  Musculoskeletal: Normal range of motion. He exhibits edema.  1+ lower leg edema  Vitals reviewed.    Labs    Chemistry  Recent Labs Lab 12/03/16 1855 12/04/16 1020 12/05/16 0242 12/06/16 0418  NA 137 140 140 141  K 4.4 4.2 4.8 4.2  CL 100* 102 102 101  CO2 25 26 27  32  GLUCOSE 206* 231* 253* 193*  BUN 58* 60* 63* 47*  CREATININE 1.80* 1.90* 1.81* 1.47*  CALCIUM 8.3* 8.3* 8.1* 8.2*  PROT 6.0*  --   --  5.6*  ALBUMIN 2.5*  --   --  2.2*  AST 207*  --   --  102*  ALT 100*  --   --  82*  ALKPHOS 176*  --   --  184*  BILITOT 5.7*  --   --  6.2*  GFRNONAA 37* 35* 37* 48*  GFRAA 43* 40* 43* 55*  ANIONGAP 12 12 11 8      Hematology  Recent Labs Lab 12/04/16 1020 12/05/16 0242 12/06/16 0418  WBC 7.1 4.3 1.5*  RBC 3.98* 3.98* 3.72*  HGB 12.4* 12.2* 11.5*  HCT 37.0* 37.0* 34.7*  MCV 93.0 93.0 93.3  MCH 31.2 30.7 30.9  MCHC 33.5 33.0 33.1  RDW 17.4* 17.6* 17.7*  PLT 69* 58* 41*    Cardiac Enzymes  Recent Labs Lab 12/04/16 1020 12/05/16 0242 12/05/16 1521 12/05/16 2240  TROPONINI 0.95* 0.84* 0.71* 0.70*   No results for input(s): TROPIPOC in the last 168 hours.   BNP  Recent Labs Lab 12/03/16 1855  BNP 435.1*     DDimer No results for input(s): DDIMER in the last 168 hours.   Radiology    No results found.  Cardiac Studies    Left Heart catheterization 11/07/16 Conclusion     Mid RCA-2 lesion, 30 %stenosed.  Mid RCA-1 lesion, 95 %stenosed.  Dist RCA lesion, 55 %stenosed.  Mid LAD lesion, 40 %stenosed.  Mid LAD to Dist LAD lesion, 60 %stenosed.  Dist LAD lesion, 70 %stenosed.  Prox Cx to Mid Cx lesion, 70 %stenosed.  Dist Cx lesion, 70 %stenosed.  2nd Mrg lesion, 70 %stenosed.  The left ventricular ejection fraction is 45-50% by visual estimate.  There is mild left ventricular systolic dysfunction.  LV end diastolic pressure is moderately elevated.  Mild LV dysfunction with inferior hypo-kinesis in a global ejection fraction at 45-50%.  Significant multivessel CAD with Doxil 40% LAD stenosis with 60-70% mid LAD stenosis; segmental 70% stenosis in the proximal circumflex extending into the on 2 vessel; and high-grade 95% focal mid RCA stenosis with 30% followed by 50-60% stenosis prior to the PDA takeoff.  RECOMMENDATION: The patient is undergoing current evaluation of a lung mass which is felt possibly due to lung cancer. He will require biopsying. Angiograms were reviewed with Dr. Sallyanne Kuster. Percutaneous coronary Intervention or CABG revascularization surgery will be considered following pulmonary workup.        Echocardiogram 11/02/16 Study Conclusions - Left ventricle: The cavity size was normal. There was mild concentric hypertrophy. Systolic function was moderately reduced. The estimated ejection fraction was in the range of 35% to 40%. Diffuse hypokinesis. Doppler parameters are consistent with abnormal left ventricular relaxation (grade 1 diastolic dysfunction). - Right atrium: The atrium was mildly dilated. - Pulmonary arteries: Systolic pressure could not be accurately estimated. - Inferior vena cava: The vessel was dilated. Respirophasic changes in dimension were absent, consistent with elevated central venous pressure. - Pericardium, extracardiac:  Possible left pleural effusion   Patient Profile     66 y.o. male recent diagnosis of small cell lung cancer, HTN, HLD, COPD, CHF, and stage 3 CKD. Admitted for worsening shortness of breath, edema and wt gain.  Assessment & Plan    Acute on chronic systolic and diastolic heart failure -Reduced EF noted in July, 35-40% -LHC found significant multivessel CAD. Intervention was deferred until complete workup for lung cancer was complete -Patient presented  with worsening dyspnea, edema, orthopnea and fatigue. Approximate 10 pound weight gain -Chest x-ray showed no edema. BNP was 435 -Lasix was increased yesterday to 40 mg every 6 hours. Spironolactone was added. Pt had 2.1L unrinue out yesterday with negative 4.8L fluid balance since admission. Wt is down 9 lbs since yesterday. 209 >>198 lbs. -Serum creatinine has come down to 1.47 (1.8 on admission) -Continue to diurese. Adjust outpatient diuretics -Pt has been working to take in at least 64 oz of liquid at home and drinking pediatlye (which is very high in sodium). Advised to cut back on pushing fluids and aim for 6-8 cups of liquid and avoid high sodium liquids. Will put in nutrition consult to help pt address his fluid balance considering chemotherapy and heart failure.   CAD -LHC in 10/2016 pound significant multivessel disease. In setting of lung cancer and poor prognosis, conservative therapy was elected. To reevaluate once full cancer workup is complete and it is known whether patient can take antiplatelet therapy. -No significant chest pain -continue aspirin, statin, beta blocker. Metoprolol was increased yesterday for better control of heart rate and BP. -Antiplatelet therapy is advisable with his CAD, however, his chemotherapy is likely to reduce his platelets and may complicate things. Will defer to Dr. Meda Coffee.  Small cell lung cancer -Followed by Dr. Earlie Server. On palliative chemotherapy. No metastasis noted on MRI. Awaiting PET  scan for further evaluation. -Pt was seen by Dr. Julien Nordmann yesterday.  -Palliative care management has been arranged for outpatient.  Hypertension -Blood pressure was mildly elevated. Lasix was increased and beta blocker was increased with better BP control. -He is back on his Bidil  CKD stage II - serum creatinine was elevated at 1.8  (baseline 1.3 ), likely cardiorenal. Improvement in SCr to 1.47 with improved fluid status -Continue to monitor renal function with diuresis.  COPD/chronic respiratory insufficiency -On home oxygen with recent increased oxygen demands. Hopefully will improve with diuresis -Management per internal medicine  Signed, Daune Perch, NP  12/06/2016, 7:51 AM    The patient was seen, examined and discussed with Daune Perch, NP-C and I agree with the above.   The patient finally feels better with SOB, currently asymptomatic, diuresed - 1.9 L overnight, down 10 lbs in 2 days, today 198 lbs, baseline 194 lbs, still  fluid overload, crea 1.9->1.4 yesterday, baseline 1.3.  We will continue lasix to 40 iv q6h and metoprolol 50 mg po BID.   Ena Dawley, MD 12/06/2016

## 2016-12-06 NOTE — Care Management Important Message (Signed)
Important Message  Patient Details IM Letter given Cookie/Case Manager to present to Patient  Name: Kevin Gutierrez MRN: [id number] Date of Birth: [birth year]   Medicare Important Message Given:  Yes    Kerin Salen 12/06/2016, 11:29 [place] Message  Patient Details  Name: Kevin Gutierrez MRN: [id number] Date of Birth: 05/04/1950   Medicare Important Message Given:  Yes    Kerin Salen 12/06/2016, 11:29 AM

## 2016-12-06 NOTE — Progress Notes (Signed)
Physical Therapy Treatment Patient Details Name: DEZI SCHANER MRN: [id number] DOB: 02/14/1951 Today's Date: 12/06/2016    History of Present Illness 66 yo male admitted with CHF, LE edema. Hx of SCLC, COPD, HTN, DM, CHF, CKD    PT Comments    Progressing with mobility. Ambulated with rollator on today to allow for seated rest breaks as needed. O2 sats 93% on 3L [place] O2 during ambulation. Continue to recommend HHPT f/u. Recommend 4 wheeled rolling walker for improved stability/safety during ambulation and ability to sit and rest as needed.    Follow Up Recommendations  Home health PT;Supervision/Assistance - 24 hour     Equipment Recommendations  4 wheeled rolling walker   Recommendations for Other Services       Precautions / Restrictions Precautions Precautions: Fall Precaution Comments: monitor O2 sats Restrictions Weight Bearing Restrictions: No    Mobility  Bed Mobility Overal bed mobility: Modified Independent                Transfers Overall transfer level: Needs assistance Equipment used: None Transfers: Sit to/from Stand Sit to Stand: Min guard         General transfer comment: close guard for safety.   Ambulation/Gait Ambulation/Gait assistance: Min guard Ambulation Distance (Feet): 125 Feet (x2) Assistive device: 4-wheeled walker Gait Pattern/deviations: Step-through pattern;Decreased stride length;Drifts right/left     General Gait Details: Close guard for safety. O2 sats 93% on 3 L [place] O2 during ambulation. One seated rest break.    Stairs            Wheelchair Mobility    Modified Rankin (Stroke Patients Only)       Balance                                            Cognition Arousal/Alertness: Awake/alert Behavior During Therapy: WFL for tasks assessed/performed Overall Cognitive Status: Within Functional Limits for tasks assessed                                        Exercises      General Comments        Pertinent Vitals/Pain Pain Assessment: No/denies pain    Home Living                      Prior Function            PT Goals (current goals can now be found in the care plan section) Progress towards PT goals: Progressing toward goals    Frequency    Min 3X/week      PT Plan Current plan remains appropriate    Co-evaluation              AM-PAC PT "6 Clicks" Daily Activity  Outcome Measure  Difficulty turning over in bed (including adjusting bedclothes, sheets and blankets)?: None Difficulty moving from lying on back to sitting on the side of the bed? : None Difficulty sitting down on and standing up from a chair with arms (e.g., wheelchair, bedside commode, etc,.)?: A Little Help needed moving to and from a bed to chair (including a wheelchair)?: A Little Help needed walking in hospital room?: A Little Help needed climbing 3-5 steps with a railing? : A Little 6 Click Score:  20    End of Session Equipment Utilized During Treatment: Oxygen Activity Tolerance: Patient tolerated treatment well Patient left: in bed;with call bell/phone within reach;with family/visitor present   PT Visit Diagnosis: Muscle weakness (generalized) (M62.81);Difficulty in walking, not elsewhere classified (R26.2)     Time: 5427-0623 PT Time Calculation (min) (ACUTE ONLY): 16 min  Charges:  $Gait Training: 8-22 mins                    G Codes:          Weston Anna, MPT Pager: [phone]

## 2016-12-06 NOTE — Progress Notes (Signed)
PROGRESS NOTE  Kevin Gutierrez  GBT:[id number] DOB: 13-Jul-1950 DOA: 12/03/2016 PCP: Coral Spikes, DO  Outpatient Specialists: Oncology, Julien Nordmann  Brief Narrative: Kevin Gutierrez is a 66 y.o. male with a history of O2-dependence, COPD, tobacco use, NIDDM, as well as SCLC and HFrEF (both recently diagnosed) who presented to the ED for progressive weight gain, leg and abdominal swelling as well as fatigue and hypotension.   The patient was just admitted to the hospital in mid July for new dyspnea progressing over the summer, found to have lung mass, pleural effusion, which on transbronchial biopsy turned out to be SCLC. While in the hospital, he was found to have EF 35% (previously unknown), and LHC showed diffuse disease, for which further treatment is pending definitive staging of his cancer (MRI brain was negative for metastases and he is still awaiting PET).  In the meantime, he started etoposide/carboplatin last week. He was diuresed during the hospitalization and discharged on Lasix, which he has been taking.   Now in the last few days, he is having worsening generalized fatigue, markedly increased leg edema and abdominal swelling, decreased urine response to Lasix, and worsening dyspnea, requiring higher flow of O2 at home. 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 His wife reports measuring SBP in 80's for which she brought him to the ED. On arrival BNP was elevated to 425, weight was up ~10lbs from recent discharge, troponin 0.76. CXR demonstrated stable left side opacity without pulmonary edema. He was admitted for acute systolic CHF and ACS evaluation, started on diuresis and cardiology consulted. Anticoagulation started for NSTEMI and PICC line inserted for poor IV access.   Assessment & Plan: Principal Problem:   Acute on chronic combined systolic and diastolic CHF (congestive heart failure)  (HCC) Active Problems:   Essential hypertension   AKI (acute kidney injury) (HCC)   CKD (chronic kidney disease) stage 3, GFR 30-59 ml/min   Coronary artery disease   Small cell lung cancer, left upper lobe (HCC)   Type 2 diabetes mellitus (HCC)   Elevated LFTs   Elevated troponin   Thrombocytopenia (HCC)  Acute on chronic systolic and diastolic CHF: Echo July 7106 showed EF 35-40%, G1DD. Discharge weight was 196lbs, 209lbs on admission. Improving, down to 198lbs 8/15.  - Continue lasix 40mg  q6h per cardiology, may need augmented home regimen. - Added spironolactone, monitor Cr, K.  - Strict I/O, daily weights.   Elevated troponin: Suspect type II NSTEMI with known ischemic cardiomyopathy and atypical chest pain.  - Seems to have plateaued. 0.76 > 0.95 > 0.84 > 0.71 > 0.70. Would like to stop anticoagulation with this trend, thrombocytopenia and no active chest pain, will discuss with cardiology.    Multivessel CAD: LHC on 11/07/2016 with 40% prox LAD stenosis, 60-70% mid LAD stenosis, segmental stenosis of prox Circ, extending into the 2 vessel, high grade 95% stenosis. Intervention was deferred until full lung cancer workup could be complete including treatment plan and prognosis estimation by pulmonology and oncology. - On ASA, statin, beta blocker.  Small cell lung cancer: Recently started palliative chemotherapy per Dr. Julien Nordmann, reportedly tolerating this well. - Port placement has been delayed pending imaging, per pt. Had PICC placed 8/14.  - PET scan scheduled 8/16 at [facility]. Discussed with Dr. Julien Nordmann, this can be rescheduled since patient is on therapy and requires ongoing inpatient care.  Acute kidney injury on stage III  CKD: SCr 1.9 on admission, up from baseline of 1.3. Improving significantly. - Monitor BMP daily with diuresis - Avoid nephrotoxins  Chronic hypoxic respiratory failure: Multifactorial.  - Tx CHF and continue COPD Tx's  COPD:  - Continue  spiriva and prn duonebs - No longer wheezing; attempting to avoid steroids with uncontrolled CBGs  Thrombocytopenia: Recent, since starting chemotherapy. Stable. - Monitor and transfuse only if bleeding or platelets <20k per oncology.  Elevated LFTs: Most consistent with congestive hepatopathy - Monitor intermittently.   T2DM: HbA1c 7.1%, but significant hyperglycemia in setting of steroids. PCP recently stated low dose basaglar 5u.  - Holding O[facility] and long-acting insulin for now, on SSI  Essential HTN: Presented with hyPOtension, since increased.   - Restarted bidil - Continuing beta blocker, increased per cardiology.   Anxiety:  - Continue home clonazepam  GERD: Chronic, stable - Continue PPI  DVT prophylaxis: SCDs Code Status: DNR Family Communication: Wife at bedside Disposition Plan: Uncertain, will need significant diuresis.   Consultants:   Cardiology, Dr. Meda Coffee  Oncology, Dr. Julien Nordmann  Procedures:   Dual lumen PICC RUE 8/14  Antimicrobials:  None   Subjective: States leg swelling is significantly improved, though they remain more swollen than baseline. No chest pain. Concerned about working Friday night and PET scan 8/16.   Objective: Vitals:   12/05/16 0935 12/05/16 1426 12/05/16 2232 12/06/16 0642  BP:  125/74 118/75 113/75  Pulse: 87 72 98 91  Resp: 18 18 18 16   Temp:  98 F (36.7 C) 98 F (36.7 C) 98.3 F (36.8 C)  TempSrc:  Oral Oral Oral  SpO2: 98% (!) 83% 94% 94%  Weight:    89.8 kg (198 lb)  Height:        Intake/Output Summary (Last 24 hours) at 12/06/16 1214 Last data filed at 12/06/16 1001  Gross per 24 hour  Intake              120 ml  Output             1800 ml  Net            -1680 ml   Filed Weights   12/04/16 0500 12/05/16 0602 12/06/16 0642  Weight: 94.8 kg (208 lb 15.9 oz) 94.8 kg (209 lb) 89.8 kg (198 lb)   Examination: General exam: Chronically ill-appearing, pleasant 66 y.o. male in no distress Respiratory system:  Non-labored on supplemental oxygen. No crackles or wheezing noted today. Cardiovascular system: Regular rate and rhythm. No murmur, rub, or gallop. No JVD. Gastrointestinal system: Abdomen remains protuberant, soft, non-tender, normoactive bowel sounds. Central nervous system: Alert and oriented. No focal neurological deficits. Extremities: Warm, no deformities. Legs with 3+ pitting edema below knees. No hair growth on lower legs/feet. Dual lumen PICC RUE, site c/d/i Skin: No rashes, lesions no ulcers Psychiatry: Judgement and insight appear normal. Mood & affect appropriate.   Data Reviewed: I have personally reviewed following labs and imaging studies  CBC:  Recent Labs Lab 12/03/16 1855 12/04/16 1020 12/05/16 0242 12/06/16 0418  WBC 10.6* 7.1 4.3 1.5*  NEUTROABS 10.1*  --   --   --   HGB 12.7* 12.4* 12.2* 11.5*  HCT 38.7* 37.0* 37.0* 34.7*  MCV 94.9 93.0 93.0 93.3  PLT 79* 69* 58* 41*   Basic Metabolic Panel:  Recent Labs Lab 12/03/16 1855 12/04/16 1020 12/05/16 0242 12/06/16 0418  NA 137 140 140 141  K 4.4 4.2 4.8 4.2  CL 100* 102 102 101  CO2 25 26 27  32  GLUCOSE 206* 231* 253* 193*  BUN 58* 60* 63* 47*  CREATININE 1.80* 1.90* 1.81* 1.47*  CALCIUM 8.3* 8.3* 8.1* 8.2*   GFR: Estimated Creatinine Clearance: 53.5 mL/min (A) (by C-G formula based on SCr of 1.47 mg/dL (H)). Liver Function Tests:  Recent Labs Lab 12/03/16 1855 12/06/16 0418  AST 207* 102*  ALT 100* 82*  ALKPHOS 176* 184*  BILITOT 5.7* 6.2*  PROT 6.0* 5.6*  ALBUMIN 2.5* 2.2*   Cardiac Enzymes:  Recent Labs Lab 12/03/16 1855 12/04/16 1020 12/05/16 0242 12/05/16 1521 12/05/16 2240  TROPONINI 0.76* 0.95* 0.84* 0.71* 0.70*   CBG:  Recent Labs Lab 12/05/16 0803 12/05/16 1154 12/05/16 2229 12/06/16 0734 12/06/16 1204  GLUCAP 161* 241* 223* 199* 180*   Urine analysis:    Component Value Date/Time   COLORURINE AMBER (A) 12/03/2016 2003   APPEARANCEUR CLOUDY (A) 12/03/2016 2003    APPEARANCEUR Clear 03/15/2016 0853   LABSPEC 1.014 12/03/2016 2003   PHURINE 5.0 12/03/2016 2003   GLUCOSEU NEGATIVE 12/03/2016 2003   HGBUR LARGE (A) 12/03/2016 2003   BILIRUBINUR NEGATIVE 12/03/2016 2003   BILIRUBINUR Negative 03/15/2016 [place] 12/03/2016 2003   PROTEINUR 30 (A) 12/03/2016 2003   NITRITE NEGATIVE 12/03/2016 2003   LEUKOCYTESUR TRACE (A) 12/03/2016 2003   LEUKOCYTESUR Negative 03/15/2016 0853   Recent Results (from the past 240 hour(s))  Urine culture     Status: Abnormal (Preliminary result)   Collection Time: 12/03/16 10:18 PM  Result Value Ref Range Status   Specimen Description URINE, RANDOM  Final   Special Requests NONE  Final   Culture (A)  Final    80,000 COLONIES/mL CITROBACTER BRAAKII 50,000 COLONIES/mL STAPHYLOCOCCUS AUREUS SUSCEPTIBILITIES TO FOLLOW Performed at [facility], [place] [address], [place], [place] [address]    Report Status PENDING  Incomplete  MRSA PCR Screening     Status: Abnormal   Collection Time: 12/04/16 12:15 AM  Result Value Ref Range Status   MRSA by PCR POSITIVE (A) NEGATIVE Final    Comment:        The GeneXpert MRSA Assay (FDA approved for NASAL specimens only), is one component of a comprehensive MRSA colonization surveillance program. It is not intended to diagnose MRSA infection nor to guide or monitor treatment for MRSA infections. RESULT CALLED TO, READ BACK BY AND VERIFIED WITH: M OBRYANT AT 0159 ON 08.13.2018 BY NBROOKS        LOS: 3 days    Time spent: 25 minutes.  Kevin Gather, MD [facility] Pager [phone]  If 7PM-7AM, please contact night-coverage [email or web address] Password [facility] 12/06/2016, 12:14 PM

## 2016-12-07 ENCOUNTER — Encounter: Payer: Self-pay | Admitting: General Practice

## 2016-12-07 ENCOUNTER — Inpatient Hospital Stay: Admission: RE | Admit: 2016-12-07 | Payer: PPO | Source: Ambulatory Visit

## 2016-12-07 DIAGNOSIS — N183 Chronic kidney disease, stage 3 (moderate): Secondary | ICD-10-CM

## 2016-12-07 LAB — CBC WITH DIFFERENTIAL/PLATELET
Basophils Absolute: 0 10*3/uL (ref 0.0–0.1)
Basophils Relative: 0 %
Eosinophils Absolute: 0 10*3/uL (ref 0.0–0.7)
Eosinophils Relative: 2 %
HCT: 34.6 % — ABNORMAL LOW (ref 39.0–52.0)
Hemoglobin: 11.5 g/dL — ABNORMAL LOW (ref 13.0–17.0)
Lymphocytes Relative: 90 %
Lymphs Abs: 0.4 10*3/uL — ABNORMAL LOW (ref 0.7–4.0)
MCH: 30.9 pg (ref 26.0–34.0)
MCHC: 33.2 g/dL (ref 30.0–36.0)
MCV: 93 fL (ref 78.0–100.0)
Monocytes Absolute: 0 10*3/uL — ABNORMAL LOW (ref 0.1–1.0)
Monocytes Relative: 3 %
Neutro Abs: 0 10*3/uL — ABNORMAL LOW (ref 1.7–7.7)
Neutrophils Relative %: 5 %
Platelets: 22 10*3/uL — CL (ref 150–400)
RBC: 3.72 MIL/uL — ABNORMAL LOW (ref 4.22–5.81)
RDW: 17.4 % — ABNORMAL HIGH (ref 11.5–15.5)
WBC: 0.4 10*3/uL — CL (ref 4.0–10.5)

## 2016-12-07 LAB — BASIC METABOLIC PANEL
ANION GAP: 9 (ref 5–15)
BUN: 44 mg/dL — ABNORMAL HIGH (ref 6–20)
CHLORIDE: 99 mmol/L — AB (ref 101–111)
CO2: 35 mmol/L — ABNORMAL HIGH (ref 22–32)
Calcium: 8.7 mg/dL — ABNORMAL LOW (ref 8.9–10.3)
Creatinine, Ser: 1.46 mg/dL — ABNORMAL HIGH (ref 0.61–1.24)
GFR calc Af Amer: 56 mL/min — ABNORMAL LOW (ref >60)
GFR, EST NON AFRICAN AMERICAN: 48 mL/min — AB (ref >60)
GLUCOSE: 250 mg/dL — AB (ref 65–99)
POTASSIUM: 5 mmol/L (ref 3.5–5.1)
Sodium: 143 mmol/L (ref 135–145)

## 2016-12-07 LAB — TYPE AND SCREEN
ABO/RH(D): A POS
Antibody Screen: NEGATIVE

## 2016-12-07 LAB — CBC
HCT: 35.1 % — ABNORMAL LOW (ref 39.0–52.0)
HEMOGLOBIN: 11.7 g/dL — AB (ref 13.0–17.0)
MCH: 31 pg (ref 26.0–34.0)
MCHC: 33.3 g/dL (ref 30.0–36.0)
MCV: 93.1 fL (ref 78.0–100.0)
PLATELETS: 36 10*3/uL — AB (ref 150–400)
RBC: 3.77 MIL/uL — AB (ref 4.22–5.81)
RDW: 17.5 % — ABNORMAL HIGH (ref 11.5–15.5)
WBC: 0.6 10*3/uL — AB (ref 4.0–10.5)

## 2016-12-07 LAB — URINE CULTURE

## 2016-12-07 LAB — ABO/RH: ABO/RH(D): A POS

## 2016-12-07 LAB — GLUCOSE, CAPILLARY
Glucose-Capillary: 266 mg/dL — ABNORMAL HIGH (ref 65–99)
Glucose-Capillary: 178 mg/dL — ABNORMAL HIGH (ref 65–99)
Glucose-Capillary: 178 mg/dL — ABNORMAL HIGH (ref 65–99)
Glucose-Capillary: 164 mg/dL — ABNORMAL HIGH (ref 65–99)

## 2016-12-07 MED ORDER — TBO-FILGRASTIM 480 MCG/0.8ML ~~LOC~~ SOSY
480.0000 ug | PREFILLED_SYRINGE | Freq: Once | SUBCUTANEOUS | Status: AC
  Administered 2016-12-07: 480 ug via SUBCUTANEOUS
  Filled 2016-12-07: qty 0.8

## 2016-12-07 MED ORDER — FLUCONAZOLE IN SODIUM CHLORIDE 100-0.9 MG/50ML-% IV SOLN
100.0000 mg | INTRAVENOUS | Status: DC
  Filled 2016-12-07: qty 50

## 2016-12-07 MED ORDER — FLUCONAZOLE IN SODIUM CHLORIDE 100-0.9 MG/50ML-% IV SOLN
100.0000 mg | INTRAVENOUS | Status: DC
  Administered 2016-12-07 – 2016-12-08 (×2): 100 mg via INTRAVENOUS
  Filled 2016-12-07 (×3): qty 50

## 2016-12-07 MED ORDER — PHENOL 1.4 % MT LIQD
1.0000 | OROMUCOSAL | Status: DC | PRN
  Filled 2016-12-07: qty 177

## 2016-12-07 MED ORDER — MAGIC MOUTHWASH W/LIDOCAINE
5.0000 mL | Freq: Four times a day (QID) | ORAL | Status: DC
  Administered 2016-12-07 (×2): 5 mL via ORAL
  Filled 2016-12-07 (×3): qty 5

## 2016-12-07 MED ORDER — SODIUM CHLORIDE 0.9 % IV SOLN
Freq: Once | INTRAVENOUS | Status: AC
  Administered 2016-12-07: 21:00:00 via INTRAVENOUS

## 2016-12-07 MED ORDER — NYSTATIN 100000 UNIT/ML MT SUSP
5.0000 mL | Freq: Four times a day (QID) | OROMUCOSAL | Status: DC
  Administered 2016-12-07 – 2016-12-09 (×2): 500000 [IU] via ORAL
  Filled 2016-12-07 (×4): qty 5

## 2016-12-07 NOTE — Progress Notes (Signed)
CRITICAL VALUE ALERT  Critical Value: WBC 0.6  Date & Time Notied:  8/16 0500  Provider Notified: Reece Levy   Orders Received/Actions taken: lab orders placed by MD on call

## 2016-12-07 NOTE — Progress Notes (Addendum)
Progress Note  Patient Name: Kevin Gutierrez Date of Encounter: 12/07/2016  Primary Cardiologist: Dr. Anabel Bene  Subjective   The patient is feeling much better. He is complaining of sore throat and would like to go home.  Inpatient Medications    Scheduled Meds: . aspirin EC  81 mg Oral Daily  . atorvastatin  20 mg Oral q1800  . Chlorhexidine Gluconate Cloth  6 each Topical Q0600  . fluticasone furoate-vilanterol  1 puff Inhalation Daily  . furosemide  40 mg Intravenous Q6H  . insulin aspart  0-5 Units Subcutaneous QHS  . insulin aspart  0-9 Units Subcutaneous TID WC  . isosorbide-hydrALAZINE  1 tablet Oral TID  . metoprolol tartrate  50 mg Oral BID  . mupirocin ointment  1 application Nasal BID  . nicotine  7 mg Transdermal Daily  . pantoprazole  40 mg Oral Daily  . potassium chloride  20 mEq Oral BID  . spironolactone  25 mg Oral Daily  . tiotropium  18 mcg Inhalation Daily   Continuous Infusions:  PRN Meds: albuterol, clonazePAM, HYDROcodone-acetaminophen, ondansetron **OR** ondansetron (ZOFRAN) IV, phenol, sodium chloride flush   Vital Signs    Vitals:   12/06/16 0642 12/06/16 1530 12/06/16 2058 12/07/16 0520  BP: 113/75 120/70 112/68 118/76  Pulse: 91 85 94 91  Resp: 16 18 18 18   Temp: 98.3 F (36.8 C) 98.4 F (36.9 C) 98.1 F (36.7 C) 98.2 F (36.8 C)  TempSrc: Oral Oral Oral Oral  SpO2: 94% 95% 95% 96%  Weight: 198 lb (89.8 kg)   197 lb 12 oz (89.7 kg)  Height:        Intake/Output Summary (Last 24 hours) at 12/07/16 1121 Last data filed at 12/07/16 0951  Gross per 24 hour  Intake              500 ml  Output             3400 ml  Net            -2900 ml   Filed Weights   12/05/16 0602 12/06/16 0642 12/07/16 0520  Weight: 209 lb (94.8 kg) 198 lb (89.8 kg) 197 lb 12 oz (89.7 kg)   Telemetry    Sinus rhythm in the 80's-90's - Personally Reviewed  ECG    No new tracings - Personally Reviewed  Physical Exam  Physical Exam  Constitutional: No  distress.  Chronically ill appearing male  HENT:  Head: Normocephalic and atraumatic.  Neck: Normal range of motion. Neck supple. No JVD present.  Cardiovascular: Normal rate and regular rhythm.  Exam reveals no gallop and no friction rub.   No murmur heard. Pulmonary/Chest: He has no rales.  Few scattered rales in the posterior bases  Abdominal: Soft. Bowel sounds are normal. He exhibits no distension.  Musculoskeletal: Normal range of motion. He exhibits edema.  1+ lower leg edema  Vitals reviewed.  Labs    Chemistry  Recent Labs Lab 12/03/16 1855  12/05/16 0242 12/06/16 0418 12/07/16 0405  NA 137  < > 140 141 143  K 4.4  < > 4.8 4.2 5.0  CL 100*  < > 102 101 99*  CO2 25  < > 27 32 35*  GLUCOSE 206*  < > 253* 193* 250*  BUN 58*  < > 63* 47* 44*  CREATININE 1.80*  < > 1.81* 1.47* 1.46*  CALCIUM 8.3*  < > 8.1* 8.2* 8.7*  PROT 6.0*  --   --  5.6*  --   ALBUMIN 2.5*  --   --  2.2*  --   AST 207*  --   --  102*  --   ALT 100*  --   --  82*  --   ALKPHOS 176*  --   --  184*  --   BILITOT 5.7*  --   --  6.2*  --   GFRNONAA 37*  < > 37* 48* 48*  GFRAA 43*  < > 43* 55* 56*  ANIONGAP 12  < > 11 8 9   < > = values in this interval not displayed.   Hematology  Recent Labs Lab 12/06/16 0418 12/07/16 0405 12/07/16 0843  WBC 1.5* 0.6* 0.4*  RBC 3.72* 3.77* 3.72*  HGB 11.5* 11.7* 11.5*  HCT 34.7* 35.1* 34.6*  MCV 93.3 93.1 93.0  MCH 30.9 31.0 30.9  MCHC 33.1 33.3 33.2  RDW 17.7* 17.5* 17.4*  PLT 41* 36* 22*   Cardiac Enzymes  Recent Labs Lab 12/04/16 1020 12/05/16 0242 12/05/16 1521 12/05/16 2240  TROPONINI 0.95* 0.84* 0.71* 0.70*   No results for input(s): TROPIPOC in the last 168 hours.   BNP  Recent Labs Lab 12/03/16 1855  BNP 435.1*    DDimer No results for input(s): DDIMER in the last 168 hours.   Radiology    No results found.  Cardiac Studies   Left Heart catheterization 11/07/16 Conclusion     Mid RCA-2 lesion, 30 %stenosed.  Mid  RCA-1 lesion, 95 %stenosed.  Dist RCA lesion, 55 %stenosed.  Mid LAD lesion, 40 %stenosed.  Mid LAD to Dist LAD lesion, 60 %stenosed.  Dist LAD lesion, 70 %stenosed.  Prox Cx to Mid Cx lesion, 70 %stenosed.  Dist Cx lesion, 70 %stenosed.  2nd Mrg lesion, 70 %stenosed.  The left ventricular ejection fraction is 45-50% by visual estimate.  There is mild left ventricular systolic dysfunction.  LV end diastolic pressure is moderately elevated.  Mild LV dysfunction with inferior hypo-kinesis in a global ejection fraction at 45-50%.  Significant multivessel CAD with [place] 40% LAD stenosis with 60-70% mid LAD stenosis; segmental 70% stenosis in the proximal circumflex extending into the on 2 vessel; and high-grade 95% focal mid RCA stenosis with 30% followed by 50-60% stenosis prior to the PDA takeoff.  RECOMMENDATION: The patient is undergoing current evaluation of a lung mass which is felt possibly due to lung cancer. He will require biopsying. Angiograms were reviewed with Dr. Sallyanne Kuster. Percutaneous coronary Intervention or CABG revascularization surgery will be considered following pulmonary workup.        Echocardiogram 11/02/16 Study Conclusions - Left ventricle: The cavity size was normal. There was mild concentric hypertrophy. Systolic function was moderately reduced. The estimated ejection fraction was in the range of 35% to 40%. Diffuse hypokinesis. Doppler parameters are consistent with abnormal left ventricular relaxation (grade 1 diastolic dysfunction). - Right atrium: The atrium was mildly dilated. - Pulmonary arteries: Systolic pressure could not be accurately estimated. - Inferior vena cava: The vessel was dilated. Respirophasic changes in dimension were absent, consistent with elevated central venous pressure. - Pericardium, extracardiac: Possible left pleural effusion   Patient Profile     66 y.o. male recent diagnosis of small  cell lung cancer, HTN, HLD, COPD, CHF, and stage 3 CKD. Admitted for worsening shortness of breath, edema and wt gain.  Assessment & Plan    Acute on chronic systolic and diastolic heart failure -Reduced EF noted in July, 35-40% -LHC found significant multivessel CAD.  Intervention was deferred until complete workup for lung cancer was complete -Patient presented with worsening dyspnea, edema, orthopnea and fatigue. Approximate 10 pound weight gain -Chest x-ray showed no edema. BNP was 435 -Serum creatinine has come down to 1.47 (1.8 on admission) -Pt has been working to take in at least 64 oz of liquid at home and drinking pediatlye (which is very high in sodium). Advised to cut back on pushing fluids and aim for 6-8 cups of liquid and avoid high sodium liquids. Will put in nutrition consult to help pt address his fluid balance considering chemotherapy and heart failure.   Ok to be discharge home from cardiology stand point, we will swotch to PO diuretics and arrange an outpatient appointment.  Discharge diuretic dose: Lasix 80 mg PO BID Spironolactone 25 mg po daily KCl 20 mEq daily  CAD -LHC in 10/2016 pound significant multivessel disease. In setting of lung cancer and poor prognosis, conservative therapy was elected. To reevaluate once full cancer workup is complete and it is known whether patient can take antiplatelet therapy. -No significant chest pain -continue aspirin, statin, beta blocker. Metoprolol was increased yesterday for better control of heart rate and BP. -Antiplatelet therapy is advisable with his CAD, however, his chemotherapy is likely to reduce his platelets and may complicate things. Will defer to Dr. Meda Coffee.  Small cell lung cancer -Followed by Dr. Earlie Server. On palliative chemotherapy. No metastasis noted on MRI. Awaiting PET scan for further evaluation. -Pt was seen by Dr. Julien Nordmann yesterday.  -Palliative care management has been arranged for  outpatient.  Hypertension -Blood pressure was mildly elevated. Lasix was increased and beta blocker was increased with better BP control. -He is back on his Bidil  CKD stage II - serum creatinine was elevated at 1.8  (baseline 1.3 ), likely cardiorenal. Improvement in SCr to 1.47 with improved fluid status -Continue to monitor renal function with diuresis.  COPD/chronic respiratory insufficiency -On home oxygen with recent increased oxygen demands. Hopefully will improve with diuresis -Management per internal medicine   Ena Dawley, MD 12/07/2016

## 2016-12-07 NOTE — Progress Notes (Signed)
Spoke with pt's wife again today concerning discharge plans and HH. Pt's wife would like [facility], also asked if there are any co pays. This information will be supplied by [facility] agency. [facility] is unable to supply a HHRN. Will check with other agencies.

## 2016-12-07 NOTE — Progress Notes (Signed)
CRITICAL VALUE ALERT  Critical Value:  Plt 22  Date & Time Notied:  12/07/2016 9:28 AM   Provider Notified: Dr. Charlies Silvers  Orders Received/Actions taken: awaiting MD response

## 2016-12-07 NOTE — Progress Notes (Addendum)
Patient ID: Kevin Gutierrez, male   DOB: 05/09/1950, 66 y.o.   MRN: [id number]  PROGRESS NOTE    Kevin Gutierrez  ZYS:[id number] DOB: Mar 07, 1951 DOA: 12/03/2016  PCP: Coral Spikes, DO   Brief Narrative:   66 y.o. male with a history of O2-dependence, COPD, tobacco use, NIDDM, SCLC and HFrEF (both recently diagnosed) who presented to the ED for progressive weight gain, leg and abdominal swelling as well as fatigue and hypotension.   The patient was just admitted to the hospital in mid July for new dyspnea progressing over the summer, found to have lung mass, pleural effusion, which on transbronchial biopsy turned out to be SCLC. While in the hospital, he was found to have EF 35% (previously unknown), and LHC showed diffuse disease, for which further treatment is pending definitive staging of his cancer (MRI brain was negative for metastases and he is still awaiting PET). In the meantime, he started etoposide/carboplatin last week. He was diuresed during the hospitalization and discharged on Lasix, which he has been taking.   Now in the last few days, he is having worsening generalized fatigue, markedly increased leg edema and abdominal swelling, decreased urine response to Lasix, and worsening dyspnea, requiring higher flow of O2 at home. 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 His wife reports measuring SBP in 80's for which she brought him to the ED. On arrival BNP was elevated to 425, weight was up ~10lbs from recent discharge, troponin 0.76. CXR demonstrated stable left side opacity without pulmonary edema. He was admitted for acute systolic CHF and ACS evaluation, started on diuresis and cardiology consulted. Anticoagulation started for NSTEMI and PICC line inserted for poor IV access.   Assessment & Plan:   Acute on chronic systolic and diastolic CHF - Echo July 9323 showed EF 35-40%, G1DD.  Discharge weight was 196lbs, 209lbs on admission. Improving, down to 198lbs 8/15.  - Continue lasix, aldactone - Continue daily weight and strict intake and output  Neutropenia - Due to sequela of chemotherapy - Give Neupogen today - Follow up CBC in am  Elevated troponin - Suspect type II NSTEMI with known ischemic cardiomyopathy and atypical chest pain.  - Seems to have plateaued. 0.76 > 0.95 > 0.84 > 0.71 > 0.70 - Stable, no reported chest pain in past 24 hours   Multivessel CAD - LHC on 11/07/2016 with 40% prox LAD stenosis, 60-70% mid LAD stenosis, segmental stenosis of prox Circ, extending into the 2 vessel, high grade 95% stenosis. Intervention was deferred until full lung cancer workup could be complete including treatment plan and prognosis estimation by pulmonology and oncology. - On ASA, statin, beta blocker  Small cell lung cancer - Follows with Dr. Julien Nordmann  Acute kidney injury on stage III CKD - Cr 1.9 on admission, up from baseline 1.3 - Continue to monitor renal function  COPD/ Acute on chronic respiratory failure with hypoxai - Continue current nebs - Continue oxygen support via [place] to keep O2 sats above 90%  Thrombocytopenia - Due to sequela of chemotherapy - Transfuse 1 U Platelets   Elevated LFTs - Likely congestive hepatopathy  Diabetes mellitus without complications - FTD3U 2.0%, but significant hyperglycemia in setting of steroids - ContinueSSI  Essential HTN - Continue lasix, bidil, metoprolol, aldactone  Anxiety - Continue Clonazepam  GERD - Continue PPI therapy   DVT prophylaxis: SCD's Code Status: DNR/DNI Family Communication: wife at  the bedside Disposition Plan: follow up CBC tomorrow and if counts stable may go home   Consultants:   Cardiology, Dr. Meda Coffee  Oncology, Dr. Julien Nordmann  Procedures:   Dual lumen PICC RUE 8/14  Antimicrobials:  None    Subjective: No overnight events.   Objective: Vitals:    12/06/16 1530 12/06/16 2058 12/07/16 0520 12/07/16 1327  BP: 120/70 112/68 118/76 103/70  Pulse: 85 94 91 91  Resp: 18 18 18 16   Temp: 98.4 F (36.9 C) 98.1 F (36.7 C) 98.2 F (36.8 C) 98.2 F (36.8 C)  TempSrc: Oral Oral Oral Oral  SpO2: 95% 95% 96% 93%  Weight:   89.7 kg (197 lb 12 oz)   Height:        Intake/Output Summary (Last 24 hours) at 12/07/16 1532 Last data filed at 12/07/16 1300  Gross per 24 hour  Intake              500 ml  Output             3600 ml  Net            -3100 ml   Filed Weights   12/05/16 0602 12/06/16 0642 12/07/16 0520  Weight: 94.8 kg (209 lb) 89.8 kg (198 lb) 89.7 kg (197 lb 12 oz)    Examination:  General exam: Appears calm and comfortable  Respiratory system: Clear to auscultation. Respiratory effort normal. Cardiovascular system: S1 & S2 heard, RRR. No JVD, murmurs, rubs, gallops or clicks. No pedal edema. Gastrointestinal system: Abdomen is nondistended, soft and nontender. No organomegaly or masses felt. Normal bowel sounds heard. Central nervous system: Alert and oriented. No focal neurological deficits. Extremities: Symmetric 5 x 5 power. Skin: No rashes, lesions or ulcers Psychiatry: Judgement and insight appear normal. Mood & affect appropriate.   Data Reviewed: I have personally reviewed following labs and imaging studies  CBC:  Recent Labs Lab 12/03/16 1855 12/04/16 1020 12/05/16 0242 12/06/16 0418 12/07/16 0405 12/07/16 0843  WBC 10.6* 7.1 4.3 1.5* 0.6* 0.4*  NEUTROABS 10.1*  --   --   --   --  0.0*  HGB 12.7* 12.4* 12.2* 11.5* 11.7* 11.5*  HCT 38.7* 37.0* 37.0* 34.7* 35.1* 34.6*  MCV 94.9 93.0 93.0 93.3 93.1 93.0  PLT 79* 69* 58* 41* 36* 22*   Basic Metabolic Panel:  Recent Labs Lab 12/03/16 1855 12/04/16 1020 12/05/16 0242 12/06/16 0418 12/07/16 0405  NA 137 140 140 141 143  K 4.4 4.2 4.8 4.2 5.0  CL 100* 102 102 101 99*  CO2 25 26 27  32 35*  GLUCOSE 206* 231* 253* 193* 250*  BUN 58* 60* 63* 47* 44*    CREATININE 1.80* 1.90* 1.81* 1.47* 1.46*  CALCIUM 8.3* 8.3* 8.1* 8.2* 8.7*   GFR: Estimated Creatinine Clearance: 53.9 mL/min (A) (by C-G formula based on SCr of 1.46 mg/dL (H)). Liver Function Tests:  Recent Labs Lab 12/03/16 1855 12/06/16 0418  AST 207* 102*  ALT 100* 82*  ALKPHOS 176* 184*  BILITOT 5.7* 6.2*  PROT 6.0* 5.6*  ALBUMIN 2.5* 2.2*   No results for input(s): LIPASE, AMYLASE in the last 168 hours. No results for input(s): AMMONIA in the last 168 hours. Coagulation Profile: No results for input(s): INR, PROTIME in the last 168 hours. Cardiac Enzymes:  Recent Labs Lab 12/03/16 1855 12/04/16 1020 12/05/16 0242 12/05/16 1521 12/05/16 2240  TROPONINI 0.76* 0.95* 0.84* 0.71* 0.70*   BNP (last 3 results) No results for input(s): PROBNP in  the last 8760 hours. HbA1C: No results for input(s): HGBA1C in the last 72 hours. CBG:  Recent Labs Lab 12/06/16 1204 12/06/16 1638 12/06/16 2112 12/07/16 0748 12/07/16 1159  GLUCAP 180* 346* 140* 266* 178*   Lipid Profile: No results for input(s): CHOL, HDL, LDLCALC, TRIG, CHOLHDL, LDLDIRECT in the last 72 hours. Thyroid Function Tests: No results for input(s): TSH, T4TOTAL, FREET4, T3FREE, THYROIDAB in the last 72 hours. Anemia Panel: No results for input(s): VITAMINB12, FOLATE, FERRITIN, TIBC, IRON, RETICCTPCT in the last 72 hours. Urine analysis:    Component Value Date/Time   COLORURINE AMBER (A) 12/03/2016 2003   APPEARANCEUR CLOUDY (A) 12/03/2016 2003   APPEARANCEUR Clear 03/15/2016 0853   LABSPEC 1.014 12/03/2016 2003   PHURINE 5.0 12/03/2016 2003   GLUCOSEU NEGATIVE 12/03/2016 2003   HGBUR LARGE (A) 12/03/2016 2003   BILIRUBINUR NEGATIVE 12/03/2016 2003   BILIRUBINUR Negative 03/15/2016 [place] 12/03/2016 2003   PROTEINUR 30 (A) 12/03/2016 2003   NITRITE NEGATIVE 12/03/2016 2003   LEUKOCYTESUR TRACE (A) 12/03/2016 2003   LEUKOCYTESUR Negative 03/15/2016 0853   Sepsis  Labs: @LABRCNTIP (procalcitonin:4,lacticidven:4)   ) Recent Results (from the past 240 hour(s))  Urine culture     Status: Abnormal   Collection Time: 12/03/16 10:18 PM  Result Value Ref Range Status   Specimen Description URINE, RANDOM  Final   Special Requests NONE  Final   Culture (A)  Final    80,000 COLONIES/mL CITROBACTER BRAAKII 50,000 COLONIES/mL METHICILLIN RESISTANT STAPHYLOCOCCUS AUREUS    Report Status 12/07/2016 FINAL  Final   Organism ID, Bacteria CITROBACTER BRAAKII (A)  Final   Organism ID, Bacteria METHICILLIN RESISTANT STAPHYLOCOCCUS AUREUS (A)  Final      Susceptibility   Citrobacter braakii - MIC*    CEFAZOLIN >=64 RESISTANT Resistant     CEFTRIAXONE <=1 SENSITIVE Sensitive     CIPROFLOXACIN <=0.25 SENSITIVE Sensitive     GENTAMICIN <=1 SENSITIVE Sensitive     IMIPENEM 0.5 SENSITIVE Sensitive     NITROFURANTOIN <=16 SENSITIVE Sensitive     TRIMETH/SULFA <=20 SENSITIVE Sensitive     PIP/TAZO <=4 SENSITIVE Sensitive     * 80,000 COLONIES/mL CITROBACTER BRAAKII   Methicillin resistant staphylococcus aureus - MIC*    CIPROFLOXACIN >=8 RESISTANT Resistant     GENTAMICIN <=0.5 SENSITIVE Sensitive     NITROFURANTOIN <=16 SENSITIVE Sensitive     OXACILLIN >=4 RESISTANT Resistant     TETRACYCLINE <=1 SENSITIVE Sensitive     VANCOMYCIN 1 SENSITIVE Sensitive     TRIMETH/SULFA <=10 SENSITIVE Sensitive     CLINDAMYCIN >=8 RESISTANT Resistant     RIFAMPIN <=0.5 SENSITIVE Sensitive     Inducible Clindamycin NEGATIVE Sensitive     * 50,000 COLONIES/mL METHICILLIN RESISTANT STAPHYLOCOCCUS AUREUS  MRSA PCR Screening     Status: Abnormal   Collection Time: 12/04/16 12:15 AM  Result Value Ref Range Status   MRSA by PCR POSITIVE (A) NEGATIVE Final    Comment:        The GeneXpert MRSA Assay (FDA approved for NASAL specimens only), is one component of a comprehensive MRSA colonization surveillance program. It is not intended to diagnose MRSA infection nor to guide  or monitor treatment for MRSA infections. RESULT CALLED TO, READ BACK BY AND VERIFIED WITH: Ronnald Ramp AT 0159 ON 08.13.2018 BY NBROOKS       Radiology Studies: Dg Chest 2 View  Result Date: 12/03/2016 CLINICAL DATA:  66 year old male recently diagnosis small cell lung cancer. Shortness of breath  with cough and congestion for the past 3-4 weeks. EXAM: CHEST  2 VIEW COMPARISON:  Chest x-ray 11/29/2016. FINDINGS: Chronic volume loss and consolidation in the left upper lobe again noted. Mass-like area in the left hilar region, corresponding to known lymphadenopathy and left upper low mass. Right lung is clear. Small left pleural effusion. No evidence of pulmonary edema. Heart size is normal. Aortic atherosclerosis. IMPRESSION: 1. No acute radiographic abnormality of the chest. The appearance of the chest is very similar to prior studies, with known left upper lobe mass, left hilar lymphadenopathy and extensive airspace consolidation and volume loss in the left upper lobe, presumably related to central obstruction. 2. Aortic atherosclerosis. Electronically Signed   By: Vinnie Langton M.D.   On: 12/03/2016 21:11     Scheduled Meds: . aspirin EC  81 mg Oral Daily  . atorvastatin  20 mg Oral q1800  . Chlorhexidine Gluconate Cloth  6 each Topical Q0600  . fluticasone furoate-vilanterol  1 puff Inhalation Daily  . furosemide  40 mg Intravenous Q6H  . insulin aspart  0-5 Units Subcutaneous QHS  . insulin aspart  0-9 Units Subcutaneous TID WC  . isosorbide-hydrALAZINE  1 tablet Oral TID  . magic mouthwash w/lidocaine  5 mL Oral QID  . metoprolol tartrate  50 mg Oral BID  . mupirocin ointment  1 application Nasal BID  . nicotine  7 mg Transdermal Daily  . pantoprazole  40 mg Oral Daily  . potassium chloride  20 mEq Oral BID  . spironolactone  25 mg Oral Daily  . Tbo-Filgrastim  480 mcg Subcutaneous Once  . tiotropium  18 mcg Inhalation Daily   Continuous Infusions: . sodium chloride        LOS: 4 days    Time spent: 25 minutes  Greater than 50% of the time spent on counseling and coordinating the care.   Leisa Lenz, MD [facility] Pager [phone]  If 7PM-7AM, please contact night-coverage [email or web address] Password [facility] 12/07/2016, 3:32 PM

## 2016-12-07 NOTE — Progress Notes (Signed)
Nutrition Education Note  RD consulted for nutrition education regarding new onset CHF.  RD provided "Low Sodium Nutrition Therapy" handout from the Academy of [employer]etics. Reviewed patient's dietary recall. Provided examples on ways to decrease sodium intake in diet. Discouraged intake of processed foods and use of salt shaker. Encouraged fresh fruits and vegetables as well as whole grain sources of carbohydrates to maximize fiber intake.   RD discussed why it is important for patient to adhere to diet recommendations, and emphasized the role of fluids, foods to avoid, and importance of weighing self daily. Discussed increased protein needs during chemotherapy treatments, and ways to increase calories if appetite decline. Teach back method used.  Pt very lethargic upon visit. Reports having prior heart failure diet education. Wife at home seems to be main support system.   Expect fair compliance.  Body mass index is 26.82 kg/m. Pt meets criteria for overweight based on current BMI.  Current diet order is Heart Healthy/Carb Modified, patient is consuming approximately 100% of meals at this time. Labs and medications reviewed. No further nutrition interventions warranted at this time. RD contact information provided. If additional nutrition issues arise, please re-consult RD.   Mariana Single RD, LDN Clinical Nutrition Pager # [phone]

## 2016-12-07 NOTE — Progress Notes (Signed)
Progress Note  Patient Name: Kevin Gutierrez Date of Encounter: 12/07/2016  Primary Cardiologist: Dr. Anabel Bene  Subjective   The patient continues to improve. He thinks that his edema is slightly improved and his breathing is near his baseline, but not quite yet. He denies chest pain.  Inpatient Medications    Scheduled Meds: . aspirin EC  81 mg Oral Daily  . atorvastatin  20 mg Oral q1800  . Chlorhexidine Gluconate Cloth  6 each Topical Q0600  . fluticasone furoate-vilanterol  1 puff Inhalation Daily  . furosemide  40 mg Intravenous Q6H  . insulin aspart  0-5 Units Subcutaneous QHS  . insulin aspart  0-9 Units Subcutaneous TID WC  . isosorbide-hydrALAZINE  1 tablet Oral TID  . metoprolol tartrate  50 mg Oral BID  . mupirocin ointment  1 application Nasal BID  . nicotine  7 mg Transdermal Daily  . pantoprazole  40 mg Oral Daily  . potassium chloride  20 mEq Oral BID  . spironolactone  25 mg Oral Daily  . tiotropium  18 mcg Inhalation Daily   Continuous Infusions:  PRN Meds: albuterol, clonazePAM, HYDROcodone-acetaminophen, ondansetron **OR** ondansetron (ZOFRAN) IV, sodium chloride flush   Vital Signs    Vitals:   12/06/16 0642 12/06/16 1530 12/06/16 2058 12/07/16 0520  BP: 113/75 120/70 112/68 118/76  Pulse: 91 85 94 91  Resp: 16 18 18 18   Temp: 98.3 F (36.8 C) 98.4 F (36.9 C) 98.1 F (36.7 C) 98.2 F (36.8 C)  TempSrc: Oral Oral Oral Oral  SpO2: 94% 95% 95% 96%  Weight: 198 lb (89.8 kg)   197 lb 12 oz (89.7 kg)  Height:        Intake/Output Summary (Last 24 hours) at 12/07/16 1030 Last data filed at 12/07/16 0951  Gross per 24 hour  Intake              500 ml  Output             3400 ml  Net            -2900 ml   Filed Weights   12/05/16 0602 12/06/16 0642 12/07/16 0520  Weight: 209 lb (94.8 kg) 198 lb (89.8 kg) 197 lb 12 oz (89.7 kg)    Telemetry    Sinus rhythm in the 80s to 90s- Personally Reviewed  ECG    No new tracings - Personally  Reviewed  Physical Exam  Physical Exam  Constitutional: He is oriented to person, place, and time. No distress.  Chronically ill appearing male  HENT:  Head: Normocephalic and atraumatic.  Neck: Normal range of motion. Neck supple. No JVD present.  Cardiovascular: Normal rate and regular rhythm.  Exam reveals no gallop and no friction rub.   No murmur heard. Pulmonary/Chest: He has no rales.  Few scattered rales in the posterior bases  Abdominal: Soft. Bowel sounds are normal. He exhibits no distension.  Musculoskeletal: Normal range of motion. He exhibits edema.  2+ lower leg edema to the knees  Neurological: He is alert and oriented to person, place, and time.  Skin: Skin is warm and dry.  Psychiatric: He has a normal mood and affect. His behavior is normal.  Vitals reviewed.    Labs    Chemistry  Recent Labs Lab 12/03/16 1855  12/05/16 0242 12/06/16 0418 12/07/16 0405  NA 137  < > 140 141 143  K 4.4  < > 4.8 4.2 5.0  CL 100*  < >  102 101 99*  CO2 25  < > 27 32 35*  GLUCOSE 206*  < > 253* 193* 250*  BUN 58*  < > 63* 47* 44*  CREATININE 1.80*  < > 1.81* 1.47* 1.46*  CALCIUM 8.3*  < > 8.1* 8.2* 8.7*  PROT 6.0*  --   --  5.6*  --   ALBUMIN 2.5*  --   --  2.2*  --   AST 207*  --   --  102*  --   ALT 100*  --   --  82*  --   ALKPHOS 176*  --   --  184*  --   BILITOT 5.7*  --   --  6.2*  --   GFRNONAA 37*  < > 37* 48* 48*  GFRAA 43*  < > 43* 55* 56*  ANIONGAP 12  < > 11 8 9   < > = values in this interval not displayed.   Hematology  Recent Labs Lab 12/06/16 0418 12/07/16 0405 12/07/16 0843  WBC 1.5* 0.6* 0.4*  RBC 3.72* 3.77* 3.72*  HGB 11.5* 11.7* 11.5*  HCT 34.7* 35.1* 34.6*  MCV 93.3 93.1 93.0  MCH 30.9 31.0 30.9  MCHC 33.1 33.3 33.2  RDW 17.7* 17.5* 17.4*  PLT 41* 36* 22*    Cardiac Enzymes  Recent Labs Lab 12/04/16 1020 12/05/16 0242 12/05/16 1521 12/05/16 2240  TROPONINI 0.95* 0.84* 0.71* 0.70*   No results for input(s): TROPIPOC in the  last 168 hours.   BNP  Recent Labs Lab 12/03/16 1855  BNP 435.1*     DDimer No results for input(s): DDIMER in the last 168 hours.   Radiology    No results found.  Cardiac Studies   Left Heart catheterization 11/07/16 Conclusion     Mid RCA-2 lesion, 30 %stenosed.  Mid RCA-1 lesion, 95 %stenosed.  Dist RCA lesion, 55 %stenosed.  Mid LAD lesion, 40 %stenosed.  Mid LAD to Dist LAD lesion, 60 %stenosed.  Dist LAD lesion, 70 %stenosed.  Prox Cx to Mid Cx lesion, 70 %stenosed.  Dist Cx lesion, 70 %stenosed.  2nd Mrg lesion, 70 %stenosed.  The left ventricular ejection fraction is 45-50% by visual estimate.  There is mild left ventricular systolic dysfunction.  LV end diastolic pressure is moderately elevated.  Mild LV dysfunction with inferior hypo-kinesis in a global ejection fraction at 45-50%.  Significant multivessel CAD with Doxil 40% LAD stenosis with 60-70% mid LAD stenosis; segmental 70% stenosis in the proximal circumflex extending into the on 2 vessel; and high-grade 95% focal mid RCA stenosis with 30% followed by 50-60% stenosis prior to the PDA takeoff.  RECOMMENDATION: The patient is undergoing current evaluation of a lung mass which is felt possibly due to lung cancer. He will require biopsying. Angiograms were reviewed with Dr. Sallyanne Kuster. Percutaneous coronary Intervention or CABG revascularization surgery will be considered following pulmonary workup.        Echocardiogram 11/02/16 Study Conclusions - Left ventricle: The cavity size was normal. There was mild concentric hypertrophy. Systolic function was moderately reduced. The estimated ejection fraction was in the range of 35% to 40%. Diffuse hypokinesis. Doppler parameters are consistent with abnormal left ventricular relaxation (grade 1 diastolic dysfunction). - Right atrium: The atrium was mildly dilated. - Pulmonary arteries: Systolic pressure could not be  accurately estimated. - Inferior vena cava: The vessel was dilated. Respirophasic changes in dimension were absent, consistent with elevated central venous pressure. - Pericardium, extracardiac: Possible left pleural effusion   Patient  Profile     66 y.o. male recent diagnosis of small cell lung cancer, HTN, HLD, COPD, CHF, and stage 3 CKD. Admitted for worsening shortness of breath, edema and wt gain.  Assessment & Plan    Acute on chronic systolic and diastolic heart failure -Reduced EF noted in July, 35-40%. LHC found significant multivessel CAD. Intervention was deferred until complete workup for lung cancer was complete -Patient presented with worsening dyspnea, edema, orthopnea and fatigue. Approximate 10 pound weight gain -Chest x-ray showed no edema. BNP was 435 -Lasix 40 mg every 6 hours. Spironolactone. Pt had 2.8L urine out yesterday with negative 7.5L fluid balance since admission. Wt is down 12 lbs. 209 >>197 lbs. Baseline wt is 194. -Serum creatinine has come down. 1.46 today stable from yesterday (1.8 on admission) -Still has considerable lower leg edema and breathing is not yet back to baseline. Abdominal fullness is much improved.  -Continue to diurese.   CAD -LHC in 10/2016 pound significant multivessel disease. In setting of lung cancer and poor prognosis, conservative therapy was elected. To reevaluate once full cancer workup is complete and it is known whether patient can take antiplatelet therapy. -No significant chest pain -continue aspirin, statin, beta blocker. Metoprolol was increased yesterday for better control of heart rate and BP. -Antiplatelet therapy is advisable with his CAD, however, his chemotherapy is likely to reduce his platelets and may complicate things. Platelet level has been dropping and is 22K today.  Small cell lung cancer -Followed by Dr. Earlie Server. On palliative chemotherapy. No metastasis noted on MRI. Awaiting PET scan for further  evaluation. -Pt was seen by Dr. Julien Nordmann yesterday.  -Palliative care management has been arranged for outpatient.  Hypertension -Blood pressure was mildly elevated. Lasix was increased and beta blocker was increased with better BP control. -He is back on his Bidil -BP is well controlled and stable. SBP in 110's  CKD stage II - serum creatinine was elevated at 1.8  (baseline 1.3 ), likely cardiorenal. Improvement in SCr to 1.46 with improved fluid status -Continue to monitor renal function with diuresis.  COPD/chronic respiratory insufficiency -On home oxygen with recent increased oxygen demands. Hopefully will improve with diuresis -Management per internal medicine  Signed, Daune Perch, NP  12/07/2016, 10:30 AM    The patient was seen, examined and discussed with Daune Perch, NP-C and I agree with the above.   Ok to be discharge home from cardiology stand point, we will swotch to PO diuretics and arrange an outpatient appointment.  Discharge diuretic dose: Lasix 80 mg PO BID Spironolactone 25 mg po daily KCl 20 mEq daily  Ena Dawley, MD 12/07/2016

## 2016-12-07 NOTE — Consult Note (Signed)
   [facility] Inpatient Consult   12/07/2016  JODY SILAS January 14, 1951 [id number]    [facility] Care Management follow up.   Spoke with inpatient RNCM who indicates patient will go home with [facility] for home health services for now. States Mr. Duchemin will bridge outpatient palliative services at a later time.   In the meantime, will make referral for [facility] for transition of care.   Marthenia Rolling, MSN-Ed, RN,BSN [facility] Liaison [phone]

## 2016-12-07 NOTE — Progress Notes (Signed)
[facility] Spiritual Care Note  Met Mr Prestia and his wife Maryjean Morn in chemo class.  When I followed up in infusion, Maryjean Morn gave me her number and asked me to call so that we might speak in more detail.  Reached her this morning while Mr Hartel is inpt at [facility].  She valued the expression of care and plans to phone me at a better time; at this point, her focus is to get him well enough to go home.   [place], [place], [facility] M-F daytime pager [phone] [facility] 24/7 pager [phone] Voicemail [phone]

## 2016-12-07 NOTE — Progress Notes (Signed)
PT Cancellation Note  Patient Details Name: AZAR SOUTH MRN: [id number] DOB: 25-May-1950   Cancelled Treatment:    Reason Eval/Treat Not Completed: Medical issues which prohibited therapy--noted drop in platelets and WBC count. Will hold PT today and check back another day.    Weston Anna, MPT Pager: [phone]

## 2016-12-07 NOTE — Progress Notes (Signed)
Pt will discharge home with [facility] for [facility], when ready bridge to [facility]. Referrals given.

## 2016-12-08 ENCOUNTER — Inpatient Hospital Stay (HOSPITAL_COMMUNITY): Payer: PPO

## 2016-12-08 LAB — PREPARE PLATELET PHERESIS: UNIT DIVISION: 0

## 2016-12-08 LAB — CBC
HEMATOCRIT: 33.4 % — AB (ref 39.0–52.0)
Hemoglobin: 11.2 g/dL — ABNORMAL LOW (ref 13.0–17.0)
MCH: 30.7 pg (ref 26.0–34.0)
MCHC: 33.5 g/dL (ref 30.0–36.0)
MCV: 91.5 fL (ref 78.0–100.0)
Platelets: 47 10*3/uL — ABNORMAL LOW (ref 150–400)
RBC: 3.65 MIL/uL — ABNORMAL LOW (ref 4.22–5.81)
RDW: 17.5 % — AB (ref 11.5–15.5)
WBC: 0.4 10*3/uL — AB (ref 4.0–10.5)

## 2016-12-08 LAB — BASIC METABOLIC PANEL
Anion gap: 12 (ref 5–15)
BUN: 41 mg/dL — AB (ref 6–20)
CALCIUM: 8.6 mg/dL — AB (ref 8.9–10.3)
CHLORIDE: 95 mmol/L — AB (ref 101–111)
CO2: 36 mmol/L — AB (ref 22–32)
CREATININE: 1.69 mg/dL — AB (ref 0.61–1.24)
GFR calc Af Amer: 47 mL/min — ABNORMAL LOW (ref >60)
GFR calc non Af Amer: 40 mL/min — ABNORMAL LOW (ref >60)
GLUCOSE: 227 mg/dL — AB (ref 65–99)
Potassium: 4 mmol/L (ref 3.5–5.1)
Sodium: 143 mmol/L (ref 135–145)

## 2016-12-08 LAB — GLUCOSE, CAPILLARY
GLUCOSE-CAPILLARY: 217 mg/dL — AB (ref 65–99)
GLUCOSE-CAPILLARY: 181 mg/dL — AB (ref 65–99)
Glucose-Capillary: 246 mg/dL — ABNORMAL HIGH (ref 65–99)
Glucose-Capillary: 202 mg/dL — ABNORMAL HIGH (ref 65–99)

## 2016-12-08 LAB — BPAM PLATELET PHERESIS
Blood Product Expiration Date: 201808182359
ISSUE DATE / TIME: 201808162349
UNIT TYPE AND RH: 5100

## 2016-12-08 MED ORDER — HYDROMORPHONE HCL-NACL 0.5-0.9 MG/ML-% IV SOSY
1.0000 mg | PREFILLED_SYRINGE | INTRAVENOUS | Status: DC | PRN
  Administered 2016-12-08 – 2016-12-09 (×5): 1 mg via INTRAVENOUS
  Filled 2016-12-08 (×5): qty 2

## 2016-12-08 MED ORDER — HYDROCODONE-ACETAMINOPHEN 10-325 MG PO TABS: 1.0000 | ORAL_TABLET | ORAL | Status: DC | PRN

## 2016-12-08 MED ORDER — TBO-FILGRASTIM 480 MCG/0.8ML ~~LOC~~ SOSY
480.0000 ug | PREFILLED_SYRINGE | Freq: Once | SUBCUTANEOUS | Status: AC
  Administered 2016-12-08: 480 ug via SUBCUTANEOUS
  Filled 2016-12-08: qty 0.8

## 2016-12-08 MED ORDER — MAGIC MOUTHWASH W/LIDOCAINE
5.0000 mL | Freq: Four times a day (QID) | ORAL | Status: DC
  Administered 2016-12-08 (×3): 5 mL via ORAL
  Filled 2016-12-08 (×6): qty 5

## 2016-12-08 NOTE — Progress Notes (Addendum)
Patient ID: Kevin Gutierrez, male   DOB: 1950/10/04, 66 y.o.   MRN: [id number]  PROGRESS NOTE    Kevin Gutierrez  [name] DOB: [birth year] DOA: 12/03/2016  PCP: Coral Spikes, DO   Brief Narrative:   66 year old male with COPD, oxygen dependent, tobacco use, diabetes, small cell lung cancer, heart failure with preserved ejection fraction who presented to ED with progressively increasing weight, leg and abdominal swelling as well as fatigue and hypotension. Patient was in hospital in mid July for dyspnea progressing over the summer and he was found to have lung mass, pleural effusion which on transbronchial biopsy turned out to be small cell lung cancer. While in the hospital, he had echo and was found to have ejection fraction of 35%, had left heart catheterization which showed diffuse disease but the further treatment is pending definitive staging of the cancer. MRI brain was negative for metastasis but patient is still awaiting PET scan. In the meantime, patient was started on etoposide and carboplatin the week prior to the admission. Patient was diuresed during the hospitalization and discharged home on Lasix. Over past few days, patient was having worsening generalized fatigue, increased lower extremity edema and abdominal swelling, decreased urine output and worsening shortness of breath. He has required higher level of oxygen at home. Apparently his blood pressure at home was in low 80s for which reason he was brought to ED. Patient's weight was up from the baseline weight by 10 pounds from recent discharge, BNP was 425, troponin 0.76. Chest x-ray showed stable left-sided opacity without pulmonary edema. He was admitted for acute systolic CHF and ACS evaluation, started on diuretic and cardiology was consulted. Anticoagulation was started for NSTEMI and PICC line was inserted due to poor IV access.   Assessment & Plan:   Acute on chronic systolic and diastolic CHF / Acute respiratory failure  with hypoxia - 2-D echo in July 2018 showed ejection fraction 67-34%, grade 1 diastolic dysfunction  - Patient's discharge weight was 196 pounds and patient was 209 pounds on the admission - BNP mildly elevated at 425 - Continue Lasix 40 mg IV every 6 hours - Continue Aldactone - Continue daily weight and strict intake and output - Weight this am 186 lb  Neutropenia - Likely due to sequela of chemotherapy - Patient did get Neulasta outpatient after chemotherapy treatment - Neupogen given 12/07/2016, will give another dose today - White blood cell count 0.4 this morning  Oral thrush / mucositis - Unable to tolerate nystatin swish and swallow - We'll try Magic mouthwash with lidocaine - Added fluconazole 100 mg daily - SLP evaluation - May require endoscopy evaluation depending on SLP results  Elevated troponin - Suspect type II NSTEMI with known ischemic cardiomyopathy and atypical chest pain.  - Seems to have plateaued. 0.76 > 0.95 > 0.84 > 0.71 > 0.70 - Stable and no reports of chest pain   Multivessel CAD - LHC on 11/07/2016 with 40% prox LAD stenosis, 60-70% mid LAD stenosis, segmental stenosis of prox Circ, extending into the 2 vessel, high grade 95% stenosis. Intervention was deferred until full lung cancer workup could be complete including treatment plan and prognosis estimation by pulmonology and oncology. - Continue aspirin, statin and metoprolol  Small cell lung cancer - Follows with Dr. Earlie Server  Acute kidney injury on stage III CKD - Cr 1.9 on admission, up from baseline 1.3 - Creatinine 1.69 this morning, improved since admission  COPD/ Acute on chronic respiratory failure with hypoxai -  Continue oxygen support via nasal cannula to keep oxygen saturation above 90%  Thrombocytopenia / anemia of chronic disease - Due to sequela of chemotherapy - Given one unit of platelets 12/07/2016 - Platelets are 47 this morning and hemoglobin 11.2  Elevated LFTs -  Likely due to congestive hepatopathy - AST 102, ALT 82, ALP 184, total bilirubin 6.2 - on 12/06/2016. Overall stable other than bilirubin which has trended up since 11/28/2016, 3.6 at that time and currently 6.2  Diabetes mellitus without complications - Continue sliding scale insulin  Essential HTN - Continue current blood pressure medications  Anxiety - Continue clonazepam  GERD - Continue Protonix  DVT prophylaxis:  SCDs Code Status: DNR/DNI Family Communication: wife at bedside Disposition Plan:  needs SLP evaluation  Consultants:   Cardiology, Dr. Meda Coffee  Oncology, Dr. Julien Nordmann  SLP  Procedures:   Dual lumen PICC RUE 8/14  Antimicrobials:  None    Subjective: Overnight, patient in more distress because issues with swallowing.   Objective: Vitals:   12/08/16 0025 12/08/16 0226 12/08/16 0500 12/08/16 0541  BP: 101/68 91/75  126/81  Pulse: (!) 102 (!) 105  (!) 107  Resp: 17 15  18   Temp: 98.3 F (36.8 C) 98.5 F (36.9 C)  99.1 F (37.3 C)  TempSrc: Oral Oral  Oral  SpO2: 95% 90%  93%  Weight:   84.6 kg (186 lb 8.2 oz)   Height:        Intake/Output Summary (Last 24 hours) at 12/08/16 0707 Last data filed at 12/08/16 0226  Gross per 24 hour  Intake              823 ml  Output             2250 ml  Net            -1427 ml   Filed Weights   12/06/16 0642 12/07/16 0520 12/08/16 0500  Weight: 89.8 kg (198 lb) 89.7 kg (197 lb 12 oz) 84.6 kg (186 lb 8.2 oz)    Physical Exam  Constitutional: Appears ill, fatigues  HENT: Normocephalic. Pt has whitish spots on tongue  CVS: RRR, S1/S2 +, no murmurs, no gallops, no carotid bruit.  Pulmonary: diminished breath sounds, no wheezing  Abdominal: Soft. BS +,  no distension, tenderness, rebound or guarding.  Musculoskeletal: Normal range of motion. No edema and no tenderness.  Lymphadenopathy: No lymphadenopathy noted, cervical, inguinal. Neuro: Alert. Normal reflexes, muscle tone coordination. No  cranial nerve deficit. Skin: Skin is warm and dry. No rash noted. Not diaphoretic. No erythema. No pallor.  Psychiatric: Normal mood and affect. Behavior, judgment, thought content normal.     Data Reviewed: I have personally reviewed following labs and imaging studies  CBC:  Recent Labs Lab 12/03/16 1855  12/05/16 0242 12/06/16 0418 12/07/16 0405 12/07/16 0843 12/08/16 0458  WBC 10.6*  < > 4.3 1.5* 0.6* 0.4* 0.4*  NEUTROABS 10.1*  --   --   --   --  0.0*  --   HGB 12.7*  < > 12.2* 11.5* 11.7* 11.5* 11.2*  HCT 38.7*  < > 37.0* 34.7* 35.1* 34.6* 33.4*  MCV 94.9  < > 93.0 93.3 93.1 93.0 91.5  PLT 79*  < > 58* 41* 36* 22* 47*  < > = values in this interval not displayed. Basic Metabolic Panel:  Recent Labs Lab 12/04/16 1020 12/05/16 0242 12/06/16 0418 12/07/16 0405 12/08/16 0458  NA 140 140 141 143 143  K 4.2 4.8  4.2 5.0 4.0  CL 102 102 101 99* 95*  CO2 26 27 32 35* 36*  GLUCOSE 231* 253* 193* 250* 227*  BUN 60* 63* 47* 44* 41*  CREATININE 1.90* 1.81* 1.47* 1.46* 1.69*  CALCIUM 8.3* 8.1* 8.2* 8.7* 8.6*   GFR: Estimated Creatinine Clearance: 46.6 mL/min (A) (by C-G formula based on SCr of 1.69 mg/dL (H)). Liver Function Tests:  Recent Labs Lab 12/03/16 1855 12/06/16 0418  AST 207* 102*  ALT 100* 82*  ALKPHOS 176* 184*  BILITOT 5.7* 6.2*  PROT 6.0* 5.6*  ALBUMIN 2.5* 2.2*   No results for input(s): LIPASE, AMYLASE in the last 168 hours. No results for input(s): AMMONIA in the last 168 hours. Coagulation Profile: No results for input(s): INR, PROTIME in the last 168 hours. Cardiac Enzymes:  Recent Labs Lab 12/03/16 1855 12/04/16 1020 12/05/16 0242 12/05/16 1521 12/05/16 2240  TROPONINI 0.76* 0.95* 0.84* 0.71* 0.70*   BNP (last 3 results) No results for input(s): PROBNP in the last 8760 hours. HbA1C: No results for input(s): HGBA1C in the last 72 hours. CBG:  Recent Labs Lab 12/06/16 2112 12/07/16 0748 12/07/16 1159 12/07/16 1706  12/07/16 2139  GLUCAP 140* 266* 178* 178* 164*   Lipid Profile: No results for input(s): CHOL, HDL, LDLCALC, TRIG, CHOLHDL, LDLDIRECT in the last 72 hours. Thyroid Function Tests: No results for input(s): TSH, T4TOTAL, FREET4, T3FREE, THYROIDAB in the last 72 hours. Anemia Panel: No results for input(s): VITAMINB12, FOLATE, FERRITIN, TIBC, IRON, RETICCTPCT in the last 72 hours. Urine analysis:    Component Value Date/Time   COLORURINE AMBER (A) 12/03/2016 2003   APPEARANCEUR CLOUDY (A) 12/03/2016 2003   APPEARANCEUR Clear 03/15/2016 0853   LABSPEC 1.014 12/03/2016 2003   PHURINE 5.0 12/03/2016 2003   GLUCOSEU NEGATIVE 12/03/2016 2003   HGBUR LARGE (A) 12/03/2016 2003   BILIRUBINUR NEGATIVE 12/03/2016 2003   BILIRUBINUR Negative 03/15/2016 0853   KETONESUR NEGATIVE 12/03/2016 2003   PROTEINUR 30 (A) 12/03/2016 2003   NITRITE NEGATIVE 12/03/2016 2003   LEUKOCYTESUR TRACE (A) 12/03/2016 2003   LEUKOCYTESUR Negative 03/15/2016 0853   Sepsis Labs: @LABRCNTIP (procalcitonin:4,lacticidven:4)   ) Recent Results (from the past 240 hour(s))  Urine culture     Status: Abnormal   Collection Time: 12/03/16 10:18 PM  Result Value Ref Range Status   Specimen Description URINE, RANDOM  Final   Special Requests NONE  Final   Culture (A)  Final    80,000 COLONIES/mL CITROBACTER BRAAKII 50,000 COLONIES/mL METHICILLIN RESISTANT STAPHYLOCOCCUS AUREUS    Report Status 12/07/2016 FINAL  Final   Organism ID, Bacteria CITROBACTER BRAAKII (A)  Final   Organism ID, Bacteria METHICILLIN RESISTANT STAPHYLOCOCCUS AUREUS (A)  Final      Susceptibility   Citrobacter braakii - MIC*    CEFAZOLIN >=64 RESISTANT Resistant     CEFTRIAXONE <=1 SENSITIVE Sensitive     CIPROFLOXACIN <=0.25 SENSITIVE Sensitive     GENTAMICIN <=1 SENSITIVE Sensitive     IMIPENEM 0.5 SENSITIVE Sensitive     NITROFURANTOIN <=16 SENSITIVE Sensitive     TRIMETH/SULFA <=20 SENSITIVE Sensitive     PIP/TAZO <=4 SENSITIVE  Sensitive     * 80,000 COLONIES/mL CITROBACTER BRAAKII   Methicillin resistant staphylococcus aureus - MIC*    CIPROFLOXACIN >=8 RESISTANT Resistant     GENTAMICIN <=0.5 SENSITIVE Sensitive     NITROFURANTOIN <=16 SENSITIVE Sensitive     OXACILLIN >=4 RESISTANT Resistant     TETRACYCLINE <=1 SENSITIVE Sensitive     VANCOMYCIN 1 SENSITIVE Sensitive  TRIMETH/SULFA <=10 SENSITIVE Sensitive     CLINDAMYCIN >=8 RESISTANT Resistant     RIFAMPIN <=0.5 SENSITIVE Sensitive     Inducible Clindamycin NEGATIVE Sensitive     * 50,000 COLONIES/mL METHICILLIN RESISTANT STAPHYLOCOCCUS AUREUS  MRSA PCR Screening     Status: Abnormal   Collection Time: 12/04/16 12:15 AM  Result Value Ref Range Status   MRSA by PCR POSITIVE (A) NEGATIVE Final    Comment:        The GeneXpert MRSA Assay (FDA approved for NASAL specimens only), is one component of a comprehensive MRSA colonization surveillance program. It is not intended to diagnose MRSA infection nor to guide or monitor treatment for MRSA infections. RESULT CALLED TO, READ BACK BY AND VERIFIED WITH: Ronnald Ramp AT 0159 ON 08.13.2018 BY NBROOKS       Radiology Studies: No results found.   Scheduled Meds: . aspirin EC  81 mg Oral Daily  . atorvastatin  20 mg Oral q1800  . Chlorhexidine Gluconate Cloth  6 each Topical Q0600  . fluticasone furoate-vilanterol  1 puff Inhalation Daily  . furosemide  40 mg Intravenous Q6H  . insulin aspart  0-5 Units Subcutaneous QHS  . insulin aspart  0-9 Units Subcutaneous TID WC  . isosorbide-hydrALAZINE  1 tablet Oral TID  . magic mouthwash w/lidocaine  5 mL Oral QID  . metoprolol tartrate  50 mg Oral BID  . mupirocin ointment  1 application Nasal BID  . nicotine  7 mg Transdermal Daily  . nystatin  5 mL Oral QID  . pantoprazole  40 mg Oral Daily  . potassium chloride  20 mEq Oral BID  . spironolactone  25 mg Oral Daily  . tiotropium  18 mcg Inhalation Daily   Continuous Infusions: . fluconazole  (DIFLUCAN) IV Stopped (12/07/16 2140)     LOS: 5 days    Time spent: 25 minutes  Greater than 50% of the time spent on counseling and coordinating the care.   Leisa Lenz, MD [facility] Pager [phone]  If 7PM-7AM, please contact night-coverage [email or web address] Password [email or web address] 12/08/2016, 7:07 AM

## 2016-12-08 NOTE — Evaluation (Signed)
Clinical/Bedside Swallow Evaluation Patient Details  Name: Kevin Gutierrez MRN: [id number] Date of Birth: Jan 23, 1951  Today's Date: 12/08/2016 Time: SLP Start Time (ACUTE ONLY): 1150 SLP Stop Time (ACUTE ONLY): 1205 SLP Time Calculation (min) (ACUTE ONLY): 15 min  Past Medical History:  Past Medical History:  Diagnosis Date  . Chicken pox   . COPD (chronic obstructive pulmonary disease) ([place])   . History of kidney stones 05/17/2015  . History of stomach ulcers 05/17/2015  . Hypertension   . Kidney stones   . Lung cancer ([place])   . Nephrolithiasis   . Peptic ulcer disease   . Vitamin D deficiency    Past Surgical History:  Past Surgical History:  Procedure Laterality Date  . LEFT HEART CATH AND CORONARY ANGIOGRAPHY N/A 11/07/2016   Procedure: Left Heart Cath and Coronary Angiography;  Surgeon: Troy Sine, MD;  Location: [facility];  Service: Cardiovascular;  Laterality: N/A;  . LITHOTRIPSY    . SPINAL CORD DECOMPRESSION  03/08/1994  . VIDEO BRONCHOSCOPY N/A 11/08/2016   Procedure: VIDEO BRONCHOSCOPY;  Surgeon: Collene Gobble, MD;  Location: [facility];  Service: Cardiopulmonary;  Laterality: N/A;   HPI:  66 y.o.malewith a past medical history significant for newly diagnosed small cell cancer of the lung on carboplatin/etoposide, COPD, HTN, NIDDM and newly diagnosed CHF EF 35%who presents with leg swelling, fatigue for 2 days. Dx acute on chronic systolic and diastolic CHF, neutropenia, suspected type II NSTEMI, oral thrush/mucositis - unable to tolerate nystatin swish and swallow, SLP swallow evaluation ordered.    Assessment / Plan / Recommendation Clinical Impression  Pt presents primarily with significant odynophagia due to mucositis that covers his palate and gum line and potentially extends into pharynx/esophagus.  Pt holds liquids orally in anticipation of pain/coughing that ensues; regurgitation occurs with all swallow attempts.  No focal CN deficits or suggestions of a  mechanical dysphagia.  Recommend further pharmacological tx of mucositis, esophageal assessment if warranted.  Unfortunately, there is little SLP services can offer.  D/W pt/family, who are anxious for a treatment.  Our services will sign off.    SLP Visit Diagnosis: Dysphagia, unspecified (R13.10)    Aspiration Risk       Diet Recommendation   advance as able and per pt's preferences  Medication Administration: Whole meds with liquid (when able; or liquid form if possible)    Other  Recommendations Recommended Consults: Consider esophageal assessment Oral Care Recommendations: Oral care BID   Follow up Recommendations        Frequency and Duration            Prognosis        Swallow Study   General HPI: 66 y.o.malewith a past medical history significant for newly diagnosed small cell cancer of the lung on carboplatin/etoposide, COPD, HTN, NIDDM and newly diagnosed CHF EF 35%who presents with leg swelling, fatigue for 2 days. Dx acute on chronic systolic and diastolic CHF, neutropenia, suspected type II NSTEMI, oral thrush/mucositis - unable to tolerate nystatin swish and swallow, SLP swallow evaluation ordered.  Type of Study: Bedside Swallow Evaluation Previous Swallow Assessment: no Diet Prior to this Study: Regular;Thin liquids Temperature Spikes Noted: Yes Respiratory Status: Nasal cannula History of Recent Intubation: No Behavior/Cognition: Alert Oral Cavity Assessment: Erythema (mucositis covering palate, gum ridges) Oral Care Completed by SLP: No Oral Cavity - Dentition: Edentulous Vision: Functional for self-feeding Self-Feeding Abilities: Able to feed self Patient Positioning: Upright in bed Baseline Vocal Quality: Normal Volitional Cough:  Strong Volitional Swallow: Able to elicit    Oral/Motor/Sensory Function Overall Oral Motor/Sensory Function: Within functional limits   Ice Chips Ice chips: Impaired Presentation: Self Fed Oral Phase Functional  Implications: Oral holding Pharyngeal Phase Impairments: Cough - Delayed (regurgitation)   Thin Liquid Thin Liquid: Impaired Presentation: Straw Oral Phase Functional Implications: Oral holding Pharyngeal  Phase Impairments: Cough - Delayed (regurgitation)    Nectar Thick Nectar Thick Liquid: Not tested   Honey Thick Honey Thick Liquid: Not tested   Puree Puree: Not tested   Solid   GO   Solid: Not tested        Kevin Gutierrez 12/08/2016,12:09 PM

## 2016-12-09 LAB — COMPREHENSIVE METABOLIC PANEL WITH GFR
ALT: 61 U/L (ref 17–63)
AST: 47 U/L — ABNORMAL HIGH (ref 15–41)
Albumin: 2.2 g/dL — ABNORMAL LOW (ref 3.5–5.0)
Alkaline Phosphatase: 143 U/L — ABNORMAL HIGH (ref 38–126)
Anion gap: 13 (ref 5–15)
BUN: 46 mg/dL — ABNORMAL HIGH (ref 6–20)
CO2: 36 mmol/L — ABNORMAL HIGH (ref 22–32)
Calcium: 8.5 mg/dL — ABNORMAL LOW (ref 8.9–10.3)
Chloride: 95 mmol/L — ABNORMAL LOW (ref 101–111)
Creatinine, Ser: 1.78 mg/dL — ABNORMAL HIGH (ref 0.61–1.24)
GFR calc Af Amer: 44 mL/min — ABNORMAL LOW
GFR calc non Af Amer: 38 mL/min — ABNORMAL LOW
Glucose, Bld: 198 mg/dL — ABNORMAL HIGH (ref 65–99)
Potassium: 3.7 mmol/L (ref 3.5–5.1)
Sodium: 144 mmol/L (ref 135–145)
Total Bilirubin: 8.1 mg/dL — ABNORMAL HIGH (ref 0.3–1.2)
Total Protein: 6.2 g/dL — ABNORMAL LOW (ref 6.5–8.1)

## 2016-12-09 LAB — CBC
HCT: 33.2 % — ABNORMAL LOW (ref 39.0–52.0)
Hemoglobin: 10.9 g/dL — ABNORMAL LOW (ref 13.0–17.0)
MCH: 30.3 pg (ref 26.0–34.0)
MCHC: 32.8 g/dL (ref 30.0–36.0)
MCV: 92.2 fL (ref 78.0–100.0)
Platelets: 22 K/uL — CL (ref 150–400)
RBC: 3.6 MIL/uL — ABNORMAL LOW (ref 4.22–5.81)
RDW: 17.7 % — ABNORMAL HIGH (ref 11.5–15.5)
WBC: 0.3 K/uL — CL (ref 4.0–10.5)

## 2016-12-09 LAB — GLUCOSE, CAPILLARY
Glucose-Capillary: 199 mg/dL — ABNORMAL HIGH (ref 65–99)
Glucose-Capillary: 318 mg/dL — ABNORMAL HIGH (ref 65–99)

## 2016-12-09 MED ORDER — SUCRALFATE 1 GM/10ML PO SUSP
1.0000 g | Freq: Three times a day (TID) | ORAL | Status: DC
  Administered 2016-12-09: 1 g via ORAL
  Filled 2016-12-09: qty 10

## 2016-12-09 MED ORDER — SUCRALFATE 1 GM/10ML PO SUSP: 1.0000 g | Freq: Three times a day (TID) | ORAL | 0 refills | Status: AC

## 2016-12-09 MED ORDER — METOPROLOL TARTRATE 5 MG/5ML IV SOLN
5.0000 mg | Freq: Once | INTRAVENOUS | Status: AC
  Administered 2016-12-09: 5 mg via INTRAVENOUS
  Filled 2016-12-09: qty 5

## 2016-12-09 MED ORDER — MAGIC MOUTHWASH W/LIDOCAINE
15.0000 mL | Freq: Four times a day (QID) | ORAL | Status: DC
  Filled 2016-12-09 (×3): qty 15

## 2016-12-09 MED ORDER — POTASSIUM CHLORIDE 10 MEQ/100ML IV SOLN
10.0000 meq | INTRAVENOUS | Status: AC
  Administered 2016-12-09 (×2): 10 meq via INTRAVENOUS
  Filled 2016-12-09 (×2): qty 100

## 2016-12-09 MED ORDER — MAGIC MOUTHWASH W/LIDOCAINE: 15.0000 mL | Freq: Four times a day (QID) | ORAL | 0 refills | Status: DC

## 2016-12-09 MED ORDER — FLUCONAZOLE 10 MG/ML PO SUSR: 100.0000 mg | Freq: Every day | ORAL | 0 refills | Status: AC

## 2016-12-09 MED ORDER — PANTOPRAZOLE SODIUM 40 MG IV SOLR
40.0000 mg | INTRAVENOUS | Status: DC
  Administered 2016-12-09: 40 mg via INTRAVENOUS

## 2016-12-09 MED ORDER — NYSTATIN 100000 UNIT/ML MT SUSP: 5.0000 mL | Freq: Four times a day (QID) | OROMUCOSAL | 0 refills | Status: DC

## 2016-12-09 MED ORDER — FILGRASTIM 480 MCG/1.6ML IJ SOLN: 480.0000 ug | Freq: Once | INTRAVENOUS | Status: DC

## 2016-12-09 MED ORDER — HEPARIN SOD (PORK) LOCK FLUSH 100 UNIT/ML IV SOLN
250.0000 [IU] | INTRAVENOUS | Status: AC | PRN
  Administered 2016-12-09: 250 [IU]

## 2016-12-09 MED ORDER — SPIRONOLACTONE 25 MG PO TABS: 25.0000 mg | ORAL_TABLET | Freq: Every day | ORAL | 0 refills | Status: AC

## 2016-12-09 MED ORDER — METOPROLOL TARTRATE 50 MG PO TABS
50.0000 mg | ORAL_TABLET | Freq: Two times a day (BID) | ORAL | Status: DC
  Administered 2016-12-09: 50 mg via ORAL
  Filled 2016-12-09: qty 1

## 2016-12-09 MED ORDER — METOPROLOL TARTRATE 50 MG PO TABS: 50.0000 mg | ORAL_TABLET | Freq: Two times a day (BID) | ORAL | Status: DC

## 2016-12-09 MED ORDER — TBO-FILGRASTIM 480 MCG/0.8ML ~~LOC~~ SOSY
480.0000 ug | PREFILLED_SYRINGE | Freq: Once | SUBCUTANEOUS | Status: AC
  Administered 2016-12-09: 480 ug via SUBCUTANEOUS
  Filled 2016-12-09: qty 0.8

## 2016-12-09 MED ORDER — SODIUM CHLORIDE 0.9 % IV SOLN
Freq: Once | INTRAVENOUS | Status: AC
Start: 2016-12-09 — End: 2016-12-09
  Administered 2016-12-09: 14:00:00 via INTRAVENOUS

## 2016-12-09 NOTE — Discharge Summary (Signed)
Physician Discharge Summary  Kevin Gutierrez JOI:[id number] DOB: February 04, 1951 DOA: 12/03/2016  PCP: Coral Spikes, DO  Admit date: 12/03/2016 Discharge date: 12/09/2016  Recommendations for Outpatient Follow-up:  For mucositis, take nystatin, fluconazole, sucralfate, magic mouthwash.  Please follow up in cancer center Monday to recheck WBC and platelet count.  Hold lasix due to increase in creatinine. Please check creatinine during next office visit.  Discharge Diagnoses:  Principal Problem:   Acute on chronic combined systolic and diastolic CHF (congestive heart failure) (HCC) Active Problems:   Essential hypertension   AKI (acute kidney injury) (HCC)   CKD (chronic kidney disease) stage 3, GFR 30-59 ml/min   Coronary artery disease   Small cell lung cancer, left upper lobe (HCC)   Type 2 diabetes mellitus (HCC)   Elevated LFTs   Elevated troponin   Thrombocytopenia (HCC)    Discharge Condition: stable   Diet recommendation: as tolerated   History of present illness:  66 year old male with COPD, oxygen dependent, tobacco use, diabetes, small cell lung cancer, heart failure with preserved ejection fraction who presented to ED with progressively increasing weight, leg and abdominal swelling as well as fatigue and hypotension. Patient was in hospital in mid July for dyspnea progressing over the summer and he was found to have lung mass, pleural effusion which on transbronchial biopsy turned out to be small cell lung cancer. While in the hospital, he had echo and was found to have ejection fraction of 35%, had left heart catheterization which showed diffuse disease but the further treatment is pending definitive staging of the cancer. MRI brain was negative for metastasis but patient is still awaiting PET scan. In the meantime, patient was started on etoposide and carboplatin the week prior to the admission. Patient was diuresed during the hospitalization and discharged home on Lasix. Over  past few days, patient was having worsening generalized fatigue, increased lower extremity edema and abdominal swelling, decreased urine output and worsening shortness of breath. He has required higher level of oxygen at home. Apparently his blood pressure at home was in low 80s for which reason he was brought to ED. Patient's weight was up from the baseline weight by 10 pounds from recent discharge, BNP was 425, troponin 0.76. Chest x-ray showed stable left-sided opacity without pulmonary edema. He was admitted for acute systolic CHF and ACS evaluation, started on diuretic and cardiology was consulted. Anticoagulation was started for NSTEMI and PICC line was inserted due to poor IV access.   Hospital Course:   Acute on chronic systolic and diastolic CHF / Acute respiratory failure with hypoxia - 2-D echo in July 2018 showed ejection fraction 47-09%, grade 1 diastolic dysfunction  - Patient's discharge weight was 196 pounds and patient was 209 pounds on the admission - BNP mildly elevated at 425 - Stop lasix due to worsening renal function - Continue Aldactone  Neutropenia - Likely due to sequela of chemotherapy - Patient did get Neulasta outpatient after chemotherapy treatment - Neupogen given 12/07/2016, 8/17 and 8/18 - Outpt blood work to monitor WBC count  Oral thrush / mucositis - Had SLP, likely mucositis - Called GI, recommended sucralfate, nystatin S&S, magic mouthwash with lidocaine - Pt cannot have EGD due to thrombocytopenia - Continue fluconazole for 10 days   Elevated troponin - Suspect type II NSTEMI with known ischemic cardiomyopathy and atypical chest pain.  - Seems to have plateaued. 0.76 >0.95 >0.84 > 0.71 > 0.70  Multivessel CAD - LHC on 11/07/2016 with 40% prox LAD  stenosis, 60-70% mid LAD stenosis, segmental stenosis of prox Circ, extending into the 2 vessel, high grade 95% stenosis. Intervention was deferred until full lung cancer workup could be complete  including treatment plan and prognosis estimation by pulmonology and oncology. - Continue aspirin, statin and metoprolol  Small cell lung cancer - Follows with Dr. Earlie Server  Acute kidney injury on stage III CKD - Cr 1.9 on admission, up from baseline 1.3 - Cr trending up likely due to lasix; holding lasix   COPD/ Acute on chronic respiratory failure with hypoxai - Stable resp status   Thrombocytopenia / anemia of chronic disease - Due to sequela of chemotherapy - Given one unit of platelets 12/07/2016 and 12/09/2016 prior to discharge   Elevated LFTs - Likely due to congestive hepatopathy - AST 102, ALT 82, ALP 184, total bilirubin 6.2 - on 12/06/2016. Overall stable other than bilirubin which has trended up since 11/28/2016, 3.6 at that time and currently 6.2  Diabetes mellitus without complications - Continue home med  Essential HTN - Continue home med  Anxiety - Continue clonazepam  GERD - Continue Protonix  DVT prophylaxis: SCD's Code Status:DNR/DNI Family Communication:wife at bedside   Consultants:  Cardiology, Dr. Meda Coffee  Oncology, Dr. Julien Nordmann  SLP  Procedures:  Dual lumen PICC RUE 8/14  Antimicrobials:  None   Signed:  Leisa Lenz, MD  [facility] 12/09/2016, 11:17 AM  Pager #: [phone]  Time spent in minutes: more than 30 minutes   Discharge Exam: Vitals:   12/08/16 2214 12/09/16 0509  BP: 137/86 106/76  Pulse: (!) 105 95  Resp: 18 17  Temp: 98.4 F (36.9 C) 98.2 F (36.8 C)  SpO2: 93% 92%   Vitals:   12/08/16 0541 12/08/16 1458 12/08/16 2214 12/09/16 0509  BP: 126/81 125/77 137/86 106/76  Pulse: (!) 107 (!) 107 (!) 105 95  Resp: 18 18 18 17   Temp: 99.1 F (37.3 C) 97.7 F (36.5 C) 98.4 F (36.9 C) 98.2 F (36.8 C)  TempSrc: Oral Oral Oral Oral  SpO2: 93% 96% 93% 92%  Weight:    89.3 kg (196 lb 13.9 oz)  Height:        General: Pt is alert, follows commands appropriately, not in acute  distress Cardiovascular: Regular rate and rhythm, S1/S2 + Respiratory: Clear to auscultation bilaterally, no wheezing, no crackles, no rhonchi Abdominal: Soft, non tender, non distended, bowel sounds +, no guarding Extremities: no edema, no cyanosis, pulses palpable bilaterally DP and PT Neuro: Grossly nonfocal  Discharge Instructions  Discharge Instructions    AMB Referral to [facility] Care Management    Complete by:  As directed    Please assign to [place] for transition of care and new CHF. Has multiple comorbidites. Will have home health as well. Currently at [facility]. Please call with questions. Marthenia Rolling, [place], RN,BSN-THN [facility] [id number]-[phone]   Reason for consult:  Please assign to [facility] RNCM   Diagnoses of:  Heart Failure   Expected date of contact:  1-3 days (reserved for hospital discharges)   Call MD for:  persistant nausea and vomiting    Complete by:  As directed    Call MD for:  severe uncontrolled pain    Complete by:  As directed    Diet - low sodium heart healthy    Complete by:  As directed    Discharge instructions    Complete by:  As directed    For mucositis, take nystatin, fluconazole, sucralfate,  magic mouthwash.  Please follow up in cancer center Monday to recheck WBC and platelet count.  Hold lasix due to increase in creatinine. Please check creatinine during next office visit.   Increase activity slowly    Complete by:  As directed      Allergies as of 12/09/2016      Reactions   No Known Allergies       Medication List    STOP taking these medications   furosemide 40 MG tablet Commonly known as:  LASIX     TAKE these medications   albuterol 108 (90 Base) MCG/ACT inhaler Commonly known as:  VENTOLIN HFA Inhale 2 puffs into the lungs every 4 (four) hours as needed for wheezing or shortness of breath.   albuterol (2.5 MG/3ML) 0.083% nebulizer solution Commonly known as:  PROVENTIL Take 3 mLs (2.5 mg  total) by nebulization every 3 (three) hours as needed for wheezing or shortness of breath.   aspirin EC 81 MG tablet Take 1 tablet (81 mg total) by mouth daily.   atorvastatin 20 MG tablet Commonly known as:  LIPITOR Take 1 tablet (20 mg total) by mouth daily.   blood glucose meter kit and supplies Dispense based on patient and insurance preference. Use up to four times daily as directed. (FOR ICD-10 E11.9).   clonazePAM 0.5 MG tablet Commonly known as:  KLONOPIN Take 1 tablet (0.5 mg total) by mouth 2 (two) times daily as needed for anxiety.   fluconazole 10 MG/ML suspension Commonly known as:  DIFLUCAN Take 10 mLs (100 mg total) by mouth daily.   Fluticasone-Umeclidin-Vilant 100-62.5-25 MCG/INH Aepb Commonly known as:  TRELEGY ELLIPTA Inhale 1 Dose into the lungs daily.   glipiZIDE 5 MG 24 hr tablet Commonly known as:  GLUCOTROL XL Take 1 tablet (5 mg total) by mouth daily with breakfast.   HYDROcodone-acetaminophen 10-325 MG tablet Commonly known as:  NORCO Take 1 tablet by mouth every 4 (four) hours as needed for severe pain (may take 1 tablet every 4-6 hours prn pain).   isosorbide-hydrALAZINE 20-37.5 MG tablet Commonly known as:  BIDIL Take 1 tablet by mouth 3 (three) times daily.   magic mouthwash w/lidocaine Soln Take 15 mLs by mouth 4 (four) times daily.   metoprolol tartrate 25 MG tablet Commonly known as:  LOPRESSOR Take 1 tablet (25 mg total) by mouth 2 (two) times daily.   nystatin 100000 UNIT/ML suspension Commonly known as:  MYCOSTATIN Take 5 mLs (500,000 Units total) by mouth 4 (four) times daily. What changed:  See the new instructions.   pantoprazole 40 MG tablet Commonly known as:  PROTONIX Take 1 tablet (40 mg total) by mouth daily.   prochlorperazine 10 MG tablet Commonly known as:  COMPAZINE Take 1 tablet (10 mg total) by mouth every 6 (six) hours as needed for nausea or vomiting.   spironolactone 25 MG tablet Commonly known as:   ALDACTONE Take 1 tablet (25 mg total) by mouth daily.   sucralfate 1 GM/10ML suspension Commonly known as:  CARAFATE Take 10 mLs (1 g total) by mouth 4 (four) times daily -  with meals and at bedtime.   tiotropium 18 MCG inhalation capsule Commonly known as:  SPIRIVA Place 1 capsule (18 mcg total) into inhaler and inhale daily.      Follow-up Information    Gollan, Kathlene November, MD Follow up.   Specialty:  Cardiology Why:  On August 31st at 10:40 for cardiology hospital follow up.  Contact information: [address] [place] [address] [phone]        Coral Spikes, DO. Schedule an appointment as soon as possible for a visit.   Specialty:  Family Medicine Contact information: [address] [place] [address] [place] [address] [phone]            The results of significant diagnostics from this hospitalization (including imaging, microbiology, ancillary and laboratory) are listed below for reference.    Significant Diagnostic Studies: Dg Chest 2 View  Result Date: 12/03/2016 CLINICAL DATA:  65 year old male recently diagnosis small cell lung cancer. Shortness of breath with cough and congestion for the past 3-4 weeks. EXAM: CHEST  2 VIEW COMPARISON:  Chest x-ray 11/29/2016. FINDINGS: Chronic volume loss and consolidation in the left upper lobe again noted. Mass-like area in the left hilar region, corresponding to known lymphadenopathy and left upper low mass. Right lung is clear. Small left pleural effusion. No evidence of pulmonary edema. Heart size is normal. Aortic atherosclerosis. IMPRESSION: 1. No acute radiographic abnormality of the chest. The appearance of the chest is very similar to prior studies, with known left upper lobe mass, left hilar lymphadenopathy and extensive airspace consolidation and volume loss in the left upper lobe, presumably related to central obstruction. 2. Aortic atherosclerosis. Electronically Signed   By: Vinnie Langton  M.D.   On: 12/03/2016 21:11   Dg Chest 2 View  Result Date: 11/29/2016 CLINICAL DATA:  Shortness of breath.  Currently on chemotherapy. EXAM: CHEST  2 VIEW COMPARISON:  Chest radiograph 11/06/2016 FINDINGS: Small left pleural effusion. Parahilar opacities in the left lung are unchanged and consistent with an obstructing mass of the left hilum. There is no new area of airspace consolidation. The right lung remains relatively clear. No pneumothorax. IMPRESSION: 1. Left parahilar opacities are consistent with a hilar mass and postobstructive atelectasis, as suggested by recent CT. 2. Increased size of left pleural effusion, which appears loculated medially. Electronically Signed   By: Ulyses Jarred M.D.   On: 11/29/2016 18:36   Mr Jeri Cos IP Contrast  Result Date: 11/29/2016 CLINICAL DATA:  Small cell lung cancer. Staging for metastatic disease. EXAM: MRI HEAD WITHOUT AND WITH CONTRAST TECHNIQUE: Multiplanar, multiecho pulse sequences of the brain and surrounding structures were obtained without and with intravenous contrast. CONTRAST:  57m MULTIHANCE GADOBENATE DIMEGLUMINE 529 MG/ML IV SOLN COMPARISON:  None. FINDINGS: Brain: 1 cm area of restricted diffusion right frontal white matter with mild patchy enhancement, most likely due to subacute infarct. No surrounding edema. Similar areas of restricted diffusion in the right parietal lobe also show mild enhancement. The enhancement pattern and streaky diffusion is most typical of subacute infarct rather than tumor. Given history of lung cancer, close follow-up is warranted. No other enhancing lesions are seen. Generalized atrophy. Moderate chronic microvascular ischemic change in the white matter and pons. Negative for intracranial hemorrhage Vascular: Normal arterial flow void Skull and upper cervical spine: Negative Sinuses/Orbits: Negative Other: None IMPRESSION: Cortical enhancing regions in the right frontal and right parietal lobe show restricted diffusion  and are most likely related to subacute infarct. Given history of lung cancer, short-term follow-up MRI in 4-6 weeks suggested to evaluate for evolutionary changes of infarct. Otherwise no evidence of metastatic disease Generalized atrophy with moderate chronic microvascular ischemia. These results will be called to the ordering clinician or representative by the Radiologist Assistant, and communication documented in the PACS or zVision Dashboard. Electronically Signed   By: CFranchot GalloM.D.  On: 11/29/2016 12:02   Dg Chest Port 1 View  Result Date: 12/08/2016 CLINICAL DATA:  Sepsis EXAM: PORTABLE CHEST 1 VIEW COMPARISON:  12/03/2016 FINDINGS: New right-sided PICC line is noted at the cavoatrial junction. The cardiac shadow is stable. Persistent fullness in the left perihilar region is noted similar to that seen on the prior exam. No new focal abnormality is noted. Healed rib fracture on the right is noted. IMPRESSION: New right-sided PICC line. The changes on the left are stable from the prior exam. Electronically Signed   By: Inez Catalina M.D.   On: 12/08/2016 07:33    Microbiology: Recent Results (from the past 240 hour(s))  Urine culture     Status: Abnormal   Collection Time: 12/03/16 10:18 PM  Result Value Ref Range Status   Specimen Description URINE, RANDOM  Final   Special Requests NONE  Final   Culture (A)  Final    80,000 COLONIES/mL CITROBACTER BRAAKII 50,000 COLONIES/mL METHICILLIN RESISTANT STAPHYLOCOCCUS AUREUS    Report Status 12/07/2016 FINAL  Final   Organism ID, Bacteria CITROBACTER BRAAKII (A)  Final   Organism ID, Bacteria METHICILLIN RESISTANT STAPHYLOCOCCUS AUREUS (A)  Final      Susceptibility   Citrobacter braakii - MIC*    CEFAZOLIN >=64 RESISTANT Resistant     CEFTRIAXONE <=1 SENSITIVE Sensitive     CIPROFLOXACIN <=0.25 SENSITIVE Sensitive     GENTAMICIN <=1 SENSITIVE Sensitive     IMIPENEM 0.5 SENSITIVE Sensitive     NITROFURANTOIN <=16 SENSITIVE Sensitive      TRIMETH/SULFA <=20 SENSITIVE Sensitive     PIP/TAZO <=4 SENSITIVE Sensitive     * 80,000 COLONIES/mL CITROBACTER BRAAKII   Methicillin resistant staphylococcus aureus - MIC*    CIPROFLOXACIN >=8 RESISTANT Resistant     GENTAMICIN <=0.5 SENSITIVE Sensitive     NITROFURANTOIN <=16 SENSITIVE Sensitive     OXACILLIN >=4 RESISTANT Resistant     TETRACYCLINE <=1 SENSITIVE Sensitive     VANCOMYCIN 1 SENSITIVE Sensitive     TRIMETH/SULFA <=10 SENSITIVE Sensitive     CLINDAMYCIN >=8 RESISTANT Resistant     RIFAMPIN <=0.5 SENSITIVE Sensitive     Inducible Clindamycin NEGATIVE Sensitive     * 50,000 COLONIES/mL METHICILLIN RESISTANT STAPHYLOCOCCUS AUREUS  MRSA PCR Screening     Status: Abnormal   Collection Time: 12/04/16 12:15 AM  Result Value Ref Range Status   MRSA by PCR POSITIVE (A) NEGATIVE Final    Comment:        The GeneXpert MRSA Assay (FDA approved for NASAL specimens only), is one component of a comprehensive MRSA colonization surveillance program. It is not intended to diagnose MRSA infection nor to guide or monitor treatment for MRSA infections. RESULT CALLED TO, READ BACK BY AND VERIFIED WITH: M OBRYANT AT 0159 ON 08.13.2018 BY NBROOKS      Labs: Basic Metabolic Panel:  Recent Labs Lab 12/05/16 0242 12/06/16 0418 12/07/16 0405 12/08/16 0458 12/09/16 0549  NA 140 141 143 143 144  K 4.8 4.2 5.0 4.0 3.7  CL 102 101 99* 95* 95*  CO2 27 32 35* 36* 36*  GLUCOSE 253* 193* 250* 227* 198*  BUN 63* 47* 44* 41* 46*  CREATININE 1.81* 1.47* 1.46* 1.69* 1.78*  CALCIUM 8.1* 8.2* 8.7* 8.6* 8.5*   Liver Function Tests:  Recent Labs Lab 12/03/16 1855 12/06/16 0418 12/09/16 0549  AST 207* 102* 47*  ALT 100* 82* 61  ALKPHOS 176* 184* 143*  BILITOT 5.7* 6.2* 8.1*  PROT 6.0* 5.6* 6.2*  ALBUMIN 2.5* 2.2* 2.2*   No results for input(s): LIPASE, AMYLASE in the last 168 hours. No results for input(s): AMMONIA in the last 168 hours. CBC:  Recent Labs Lab  12/03/16 1855  12/06/16 0418 12/07/16 0405 12/07/16 0843 12/08/16 0458 12/09/16 0549  WBC 10.6*  < > 1.5* 0.6* 0.4* 0.4* 0.3*  NEUTROABS 10.1*  --   --   --  0.0*  --   --   HGB 12.7*  < > 11.5* 11.7* 11.5* 11.2* 10.9*  HCT 38.7*  < > 34.7* 35.1* 34.6* 33.4* 33.2*  MCV 94.9  < > 93.3 93.1 93.0 91.5 92.2  PLT 79*  < > 41* 36* 22* 47* 22*  < > = values in this interval not displayed. Cardiac Enzymes:  Recent Labs Lab 12/03/16 1855 12/04/16 1020 12/05/16 0242 12/05/16 1521 12/05/16 2240  TROPONINI 0.76* 0.95* 0.84* 0.71* 0.70*   BNP: BNP (last 3 results)  Recent Labs  11/04/16 2357 12/03/16 1855  BNP 370.4* 435.1*    ProBNP (last 3 results) No results for input(s): PROBNP in the last 8760 hours.  CBG:  Recent Labs Lab 12/08/16 0851 12/08/16 1202 12/08/16 1605 12/08/16 2211 12/09/16 0734  GLUCAP 246* 202* 217* 181* 199*

## 2016-12-09 NOTE — Progress Notes (Signed)
Pt discharged home. Discharge teaching completed. Pt and wife verbalizes understanding. Pt left floor via wheelchair.

## 2016-12-09 NOTE — Progress Notes (Signed)
Pt had 4 beat run of v-tach. Pt not asymptomatic. Dr. Charlies Silvers notified.

## 2016-12-09 NOTE — Progress Notes (Signed)
PT Cancellation Note  Patient Details Name: Kevin Gutierrez MRN: [id number] DOB: [birth year]   Cancelled Treatment:    Reason Eval/Treat Not Completed: Attempted PT tx session-pt declined participation on today. Will check back another day.    Weston Anna, MPT Pager: [phone]

## 2016-12-09 NOTE — Consult Note (Signed)
[facility] Consult  Referring Provider: Robbie Lis, MD Primary Care Physician:  Coral Spikes, DO Primary Gastroenterologist: Althia Forts  Reason for Consultation:  Oral ulcers, painful swallowing  HPI: AJDIN MACKE is a 66 y.o. Caucasian male , diagnosed with small cell cancer of the lung in July and started on chemotherapy recently. He was admitted with fluid overload, increased swelling of bilateral extremities and shortness of breath on week ago. 3-4 days ago, patient developed pain and burning sensation in his mouth. It was associated with difficulty swallowing. I was provided with history that the roof of his mouth and the inside of his cheeks had multiple deposits of white patches, and it was presumed that he had oral thrush. He was treated with oral nystatin with minimal improvement in symptoms, so he was also given IV fluconazole.  Patient states that it burns to swallow food. He has only been able to drink fluids which are very cold, if they are given at room temperature or any warmer, he cannot swallow it. He has been able to drink soda, swallow yogurt and ice cream.  Patient reports that he has a bowel movement in 3-5 days, denies blood in stool or black stools. He states he has a good appetite and wants to eat, but due to painful swallowing is not able to do so. He has never had a colonoscopy in the past. He has had an EGD in 1990s.    Past Medical History:  Diagnosis Date  . Chicken pox   . COPD (chronic obstructive pulmonary disease) ([place])   . History of kidney stones 05/17/2015  . History of stomach ulcers 05/17/2015  . Hypertension   . Kidney stones   . Lung cancer ([place])   . Nephrolithiasis   . Peptic ulcer disease   . Vitamin D deficiency     Past Surgical History:  Procedure Laterality Date  . LEFT HEART CATH AND CORONARY ANGIOGRAPHY N/A 11/07/2016   Procedure: Left Heart Cath and Coronary Angiography;  Surgeon: Troy Sine, MD;  Location: [facility];  Service: Cardiovascular;  Laterality: N/A;  . LITHOTRIPSY    . SPINAL CORD DECOMPRESSION  03/08/1994  . VIDEO BRONCHOSCOPY N/A 11/08/2016   Procedure: VIDEO BRONCHOSCOPY;  Surgeon: Collene Gobble, MD;  Location: [facility];  Service: Cardiopulmonary;  Laterality: N/A;    Prior to Admission medications   Medication Sig Start Date End Date Taking? Authorizing Provider  albuterol (PROVENTIL) (2.5 MG/3ML) 0.083% nebulizer solution Take 3 mLs (2.5 mg total) by nebulization every 3 (three) hours as needed for wheezing or shortness of breath. 08/04/16  Yes Cook, Jayce G, DO  albuterol (VENTOLIN HFA) 108 (90 Base) MCG/ACT inhaler Inhale 2 puffs into the lungs every 4 (four) hours as needed for wheezing or shortness of breath. 07/18/16  Yes Flora Lipps, MD  aspirin EC 81 MG tablet Take 1 tablet (81 mg total) by mouth daily. 05/17/15  Yes Cook, Jayce G, DO  atorvastatin (LIPITOR) 20 MG tablet Take 1 tablet (20 mg total) by mouth daily. 08/04/16  Yes Cook, Jayce G, DO  blood glucose meter kit and supplies Dispense based on patient and insurance preference. Use up to four times daily as directed. (FOR ICD-10 E11.9). 11/11/16  Yes Johnson, Clanford L, MD  clonazePAM (KLONOPIN) 0.5 MG tablet Take 1 tablet (0.5 mg total) by mouth 2 (two) times daily as needed for anxiety. 11/02/16  Yes Cook, Jayce G, DO  furosemide (LASIX) 40 MG tablet Take  1 tablet (40 mg total) by mouth daily. 11/20/16  Yes Cook, Jayce G, DO  glipiZIDE (GLUCOTROL XL) 5 MG 24 hr tablet Take 1 tablet (5 mg total) by mouth daily with breakfast. 11/20/16  Yes Cook, Jayce G, DO  HYDROcodone-acetaminophen (NORCO) 10-325 MG tablet Take 1 tablet by mouth every 4 (four) hours as needed for severe pain (may take 1 tablet every 4-6 hours prn pain). 12/01/16  Yes Curt Bears, MD  isosorbide-hydrALAZINE (BIDIL) 20-37.5 MG tablet Take 1 tablet by mouth 3 (three) times daily. 11/11/16 12/11/16 Yes Johnson, Clanford L, MD  metoprolol tartrate  (LOPRESSOR) 25 MG tablet Take 1 tablet (25 mg total) by mouth 2 (two) times daily. 11/11/16 12/11/16 Yes Johnson, Clanford L, MD  nystatin (MYCOSTATIN) 100000 UNIT/ML suspension TAKE  5 ML BY MOUTH 4 TIMES DAILY 11/14/16  Yes Kasa, Maretta Bees, MD  pantoprazole (PROTONIX) 40 MG tablet Take 1 tablet (40 mg total) by mouth daily. 08/04/16  Yes Cook, Jayce G, DO  prochlorperazine (COMPAZINE) 10 MG tablet Take 1 tablet (10 mg total) by mouth every 6 (six) hours as needed for nausea or vomiting. 11/15/16  Yes Curt Bears, MD  tiotropium (SPIRIVA) 18 MCG inhalation capsule Place 1 capsule (18 mcg total) into inhaler and inhale daily. 08/30/16  Yes Kasa, Maretta Bees, MD  Fluticasone-Umeclidin-Vilant (TRELEGY ELLIPTA) 100-62.5-25 MCG/INH AEPB Inhale 1 Dose into the lungs daily. Patient not taking: Reported on 11/29/2016 11/20/16   Coral Spikes, DO    Current Facility-Administered Medications  Medication Dose Route Frequency Provider Last Rate Last Dose  . 0.9 %  sodium chloride infusion   Intravenous Once Robbie Lis, MD      . albuterol (PROVENTIL) (2.5 MG/3ML) 0.083% nebulizer solution 2.5 mg  2.5 mg Nebulization Q3H PRN Danford, Suann Larry, MD      . aspirin EC tablet 81 mg  81 mg Oral Daily Edwin Dada, MD   81 mg at 12/07/16 0942  . atorvastatin (LIPITOR) tablet 20 mg  20 mg Oral q1800 Edwin Dada, MD   20 mg at 12/07/16 1719  . clonazePAM (KLONOPIN) tablet 0.5 mg  0.5 mg Oral BID PRN Edwin Dada, MD   0.5 mg at 12/06/16 0946  . fluconazole (DIFLUCAN) IVPB 100 mg  100 mg Intravenous Q24H Robbie Lis, MD   Stopped at 12/08/16 2209  . fluticasone furoate-vilanterol (BREO ELLIPTA) 100-25 MCG/INH 1 puff  1 puff Inhalation Daily Vance Gather B, MD      . furosemide (LASIX) injection 40 mg  40 mg Intravenous Q6H Dorothy Spark, MD   40 mg at 12/09/16 0244  . HYDROcodone-acetaminophen (NORCO) 10-325 MG per tablet 1 tablet  1 tablet Oral Q4H PRN Robbie Lis, MD      .  HYDROmorphone (DILAUDID) injection 1 mg  1 mg Intravenous Q3H PRN Robbie Lis, MD   1 mg at 12/09/16 0257  . insulin aspart (novoLOG) injection 0-5 Units  0-5 Units Subcutaneous QHS Edwin Dada, MD   2 Units at 12/05/16 2231  . insulin aspart (novoLOG) injection 0-9 Units  0-9 Units Subcutaneous TID WC Danford, Suann Larry, MD   3 Units at 12/08/16 1714  . isosorbide-hydrALAZINE (BIDIL) 20-37.5 MG per tablet 1 tablet  1 tablet Oral TID Patrecia Pour, MD   1 tablet at 12/07/16 2200  . magic mouthwash w/lidocaine  15 mL Oral QID Ronnette Juniper, MD      . metoprolol tartrate (LOPRESSOR) tablet 50 mg  50  mg Oral BID Walden Field A, NP      . mupirocin ointment (BACTROBAN) 2 % 1 application  1 application Nasal BID Patrecia Pour, MD   1 application at 67/89/38 [phone]  . nicotine (NICODERM CQ - dosed in mg/24 hr) patch 7 mg  7 mg Transdermal Daily Danford, Suann Larry, MD   7 mg at 12/08/16 0914  . nystatin (MYCOSTATIN) 100000 UNIT/ML suspension 500,000 Units  5 mL Oral QID Robbie Lis, MD   500,000 Units at 12/07/16 2039  . ondansetron (ZOFRAN) tablet 4 mg  4 mg Oral Q6H PRN Danford, Suann Larry, MD       Or  . ondansetron (ZOFRAN) injection 4 mg  4 mg Intravenous Q6H PRN Danford, Suann Larry, MD      . pantoprazole (PROTONIX) EC tablet 40 mg  40 mg Oral Daily Danford, Suann Larry, MD   40 mg at 12/07/16 0942  . phenol (CHLORASEPTIC) mouth spray 1 spray  1 spray Mouth/Throat PRN Robbie Lis, MD      . potassium chloride SA (K-DUR,KLOR-CON) CR tablet 20 mEq  20 mEq Oral BID Edwin Dada, MD   20 mEq at 12/07/16 2200  . sodium chloride flush (NS) 0.9 % injection 10-40 mL  10-40 mL Intracatheter PRN Patrecia Pour, MD   20 mL at 12/08/16 5102  . spironolactone (ALDACTONE) tablet 25 mg  25 mg Oral Daily Daune Perch, NP   25 mg at 12/07/16 0941  . sucralfate (CARAFATE) 1 GM/10ML suspension 1 g  1 g Oral TID WC & HS Ronnette Juniper, MD      . tiotropium [facility]) inhalation  capsule 18 mcg  18 mcg Inhalation Daily Edwin Dada, MD   18 mcg at 12/05/16 0933    Allergies as of 12/03/2016 - Review Complete 12/03/2016  Allergen Reaction Noted  . No known allergies  11/07/2016    Family History  Problem Relation Age of Onset  . Diabetes Mellitus II Brother   . Lung cancer Brother   . Heart disease Brother        CABG in his 52's  . Hyperlipidemia Brother   . Heart disease Mother        MI in her 63's  . Stroke Father   . Hypertension Brother     Social History   Social History  . Marital status: Married    Spouse name: N/A  . Number of children: N/A  . Years of education: N/A   Occupational History  . retired    Social History Main Topics  . Smoking status: Current Every Day Smoker    Packs/day: 0.50    Years: 50.00    Types: Cigarettes  . Smokeless tobacco: Never Used     Comment: Currently smoking 5 cigarettes daily  . Alcohol use No  . Drug use: Yes    Types: Marijuana     Comment: Occasional marijuana use  . Sexual activity: Yes   Other Topics Concern  . Not on file   Social History Narrative   Lives with family    Review of Systems: Positive for: GI: Described in detail in HPI.    Gen: fatigue, weakness, malaise,Denies any fever, chills, rigors, night sweats, anorexia,involuntary weight loss, and sleep disorder CV: orthopnea, PND, peripheral edema,Denies chest pain, angina, palpitations, syncope  and claudication. Resp:  dyspnea, cough, sputum,Denies, wheezing, coughing up blood. GU : Denies urinary burning, blood in urine, urinary frequency, urinary hesitancy, nocturnal urination, and  urinary incontinence. MS: Denies joint pain or swelling.  Denies muscle weakness, cramps, atrophy.  Derm: Denies rash, itching, oral ulcerations, hives, unhealing ulcers.  Psych: Denies depression, anxiety, memory loss, suicidal ideation, hallucinations,  and confusion. Heme: Denies bruising, bleeding, and enlarged lymph nodes. Neuro:   Denies any headaches, dizziness, paresthesias. Endo:  DM,Denies any problems with thyroid, adrenal function.  Physical Exam: Vital signs in last 24 hours: Temp:  [97.7 F (36.5 C)-98.4 F (36.9 C)] 98.2 F (36.8 C) (08/18 0509) Pulse Rate:  [95-107] 95 (08/18 0509) Resp:  [17-18] 17 (08/18 0509) BP: (106-137)/(76-86) 106/76 (08/18 0509) SpO2:  [92 %-96 %] 92 % (08/18 0509) Weight:  [89.3 kg (196 lb 13.9 oz)] 89.3 kg (196 lb 13.9 oz) (08/18 0509) Last BM Date: 12/03/16  General:   Alert,  Well-developed, well-nourished, pleasant and cooperative in NAD Head:  Normocephalic and atraumatic. Eyes:  Sclera clear, obvious icterus.   Conjunctiva pale. Ears:  Normal auditory acuity. Nose:  No deformity, discharge,  or lesions. Mouth: Few scattered white patches noted on left lower gum, right upper gum wtih ulcers, no blisters. Neck:  Supple; no masses or thyromegaly. Lungs:  Clear throughout to auscultation.   No wheezes, crackles, or rhonchi. No acute distress. Heart:  Regular rate and rhythm; no murmurs, clicks, rubs,  or gallops. Extremities:  Significant bilateral pitting edema. Neurologic:  Alert and  oriented x4;  grossly normal neurologically. Skin:  Intact without significant lesions or rashes. Psych:  Alert and cooperative. Normal mood and affect. Abdomen:  Soft, nontender and nondistended. No masses, hepatosplenomegaly or hernias noted. Normal bowel sounds, without guarding, and without rebound.         Lab Results:  Recent Labs  12/07/16 0843 12/08/16 0458 12/09/16 0549  WBC 0.4* 0.4* 0.3*  HGB 11.5* 11.2* 10.9*  HCT 34.6* 33.4* 33.2*  PLT 22* 47* 22*   BMET  Recent Labs  12/07/16 0405 12/08/16 0458 12/09/16 0549  NA 143 143 144  K 5.0 4.0 3.7  CL 99* 95* 95*  CO2 35* 36* 36*  GLUCOSE 250* 227* 198*  BUN 44* 41* 46*  CREATININE 1.46* 1.69* 1.78*  CALCIUM 8.7* 8.6* 8.5*   LFT  Recent Labs  12/09/16 0549  PROT 6.2*  ALBUMIN 2.2*  AST 47*  ALT 61   ALKPHOS 143*  BILITOT 8.1*   PT/INR No results for input(s): LABPROT, INR in the last 72 hours.  Studies/Results: Dg Chest Port 1 View  Result Date: 12/08/2016 CLINICAL DATA:  Sepsis EXAM: PORTABLE CHEST 1 VIEW COMPARISON:  12/03/2016 FINDINGS: New right-sided PICC line is noted at the cavoatrial junction. The cardiac shadow is stable. Persistent fullness in the left perihilar region is noted similar to that seen on the prior exam. No new focal abnormality is noted. Healed rib fracture on the right is noted. IMPRESSION: New right-sided PICC line. The changes on the left are stable from the prior exam. Electronically Signed   By: Inez Catalina M.D.   On: 12/08/2016 07:33    Impression: 1. Mucositis, possible oral thrush, possible herpetic ulcers(blisters not noted) 2. Neutropenia, thrombocytopenia 3. Elevated LFTs(T bili 8.1/AST 47/ALT 61/ALP 143) 4. Small cell lung cancer, status post recent chemotherapy 5. Acute kidney injury 6. COPD   Plan: 1. Oral ulcers do not appear herpetic, however, with recent chemotherapy and immunocompromised state, herpetic ulcers need to be considered. Recommend sending HSV IgM, and will recommend taking swap from ulcers. Treatment with acyclovir/valacyclovir to be considered accordingly.  2. EGD  not a possibility currently with underlying neutropenia and thrombocytopenia.  3. Will add sucralfate suspension 4 times a day.  4. Continue Protonix 40 mg, change to IV from by mouth.  5. Consider ultrasound of liver to investigate abnormal LFTs(can be done as an outpatient). Most likely multifactorial, could be related to recent chemotherapy.  5. Continue nystatin 4 times a day, increase magic mouthwash with lidocaine to 15 mL 4 times a day. Patient on IV fluconazole 100 mg daily.     LOS: 6 days   Ronnette Juniper, M.D.  12/09/2016, 9:47 AM  Pager [phone] If no answer or after 5 PM call [phone]

## 2016-12-09 NOTE — Progress Notes (Signed)
Dr. Charlies Silvers notified due to patient and wife having questions concerning his discharge.

## 2016-12-09 NOTE — Progress Notes (Signed)
Per family pt not able to do inhalers due to pt condition. No distress noted at this time.

## 2016-12-09 NOTE — Discharge Instructions (Signed)
Oral Mucositis Oral mucositis is a mouth condition that may develop from treatments for cancer. With this condition, sores may appear on your lips, gums, tongue, throat, and the top (roof) or bottom (floor) of your mouth. What are the causes? Oral mucositis can happen to anyone who is being treated with cancer therapies, including:  Cancer medicines (chemotherapy).  Radiation therapy.  Bone marrow transplants and stem cell transplants.  Cancer treatments can damage the lining of the mouth, and that causes this condition. Oral mucositis is not caused by infection. However, the sores can become infected after they form. Infection can make oral mucositis worse. What increases the risk? This condition is more likely to develop in people:  With poor oral hygiene.  With dental problems or oral diseases.  Who use any tobacco product, including cigarettes, chewing tobacco, and electronic cigarettes.  Who drink alcohol.  Who have other medical conditions, such as diabetes, HIV, AIDS, or kidney disease.  Who do not drink enough clear fluids.  Who wear dentures that do not fit correctly.  Who have cancers that primarily affect the blood.  Who are male.  What are the signs or symptoms? Symptoms can vary from mild to severe. Symptoms are usually seen 7-10 days after cancer treatment has started. Symptoms include:  Mouth sores. These sores may bleed.  Color changes inside the mouth. Red, shiny areas may appear.  White patches or pus in the mouth.  Pain in the mouth and throat. This can make it painful to speak.  Dryness and a burning feeling in the mouth.  Saliva that is dry and thick.  Trouble eating, drinking, and swallowing. This can lead to weight loss.  How is this diagnosed? This condition can be diagnosed with a physical exam. How is this treated? Treatment depends on the severity of the condition. Oral mucositis often heals on its own. Sometimes, changes in the cancer  treatment can help. Treatment may include medicines, such as:  An antibiotic medicine to fight infection, if present.  Medicine to help the cells in your mouth heal more quickly.  Medicine may also be given to help control pain. This may include:  Pain relievers that are swished around in the mouth. These make the mouth numb to ease the pain (topical anesthetics).  Mouth rinses.  Prescribed, medicated gels. The gel coats the mouth. This protects nerve endings and lessens pain.  Narcotic pain medicines.  Follow these instructions at home:  Medicines  If you were prescribed an antibiotic medicine, finish all of it even if you start to feel better.  Take or apply medicines only as directed by your health care provider. Lifestyle   Keeping your mouth clean and germ-free is important. To maintain good oral hygiene: ? Brush your teeth carefully with a soft, nylon-bristled toothbrush at least two times each day. Use a gentle toothpaste. Ask your health care provider for a toothpaste recommendation. ? Floss your teeth every day. ? Have your teeth cleaned regularly as recommended by your dentist. ? Rinse your mouth after every meal or as directed by your health care provider. Do not use mouthwash that contains alcohol. Ask your health care provider for a mouthwash recommendation.  Do not use any tobacco products, including cigarettes, chewing tobacco, and electronic cigarettes. If you need help quitting, ask your health care provider.  Avoid eating: ? Hot and spicy foods. ? Citrus. ? Foods that have sharp edges, such as chips. ? Sugary foods, such as candy.  Do not drink  alcohol. General instructions  Clean the sores as directed by your health care provider.  If your lips are dry or cracked, apply a water-based moisturizer to your lips as needed.  Try sucking on ice chips or sugar-free frozen pops. This may help with pain. This also keeps your mouth moist.  Drink enough fluid  to keep your urine clear or pale yellow.  If you are losing weight, talk with your health care provider.  If you have dentures, take them out often as directed by your health care provider.  Keep all follow-up visits as directed by your health care provider. This is important. Contact a health care provider if:  You have mouth pain or throat pain.  You are having more trouble swallowing.  Your symptoms get worse.  You have new symptoms.  Your pain is not controlled with medicine. Get help right away if:  You have a lot of bleeding in your mouth.  You have trouble speaking.  You develop new, open, or draining sores in your mouth.  You cannot swallow solid food or liquids.  You have a fever. This information is not intended to replace advice given to you by your health care provider. Make sure you discuss any questions you have with your health care provider. Document Released: 11/25/2010 Document Revised: 09/16/2015 Document Reviewed: 04/06/2014 Elsevier Interactive Patient Education  [employer].

## 2016-12-09 NOTE — Progress Notes (Signed)
CRITICAL VALUE ALERT  Critical Value:  Platelets 22  Date & Time Notied:  12/09/16 0700  Provider Notified: Charlies Silvers  Orders Received/Actions taken: TBD

## 2016-12-10 DIAGNOSIS — K219 Gastro-esophageal reflux disease without esophagitis: Secondary | ICD-10-CM | POA: Diagnosis not present

## 2016-12-10 DIAGNOSIS — Z79891 Long term (current) use of opiate analgesic: Secondary | ICD-10-CM | POA: Diagnosis not present

## 2016-12-10 DIAGNOSIS — I5043 Acute on chronic combined systolic (congestive) and diastolic (congestive) heart failure: Secondary | ICD-10-CM | POA: Diagnosis not present

## 2016-12-10 DIAGNOSIS — Z7984 Long term (current) use of oral hypoglycemic drugs: Secondary | ICD-10-CM | POA: Diagnosis not present

## 2016-12-10 DIAGNOSIS — C3490 Malignant neoplasm of unspecified part of unspecified bronchus or lung: Secondary | ICD-10-CM | POA: Diagnosis not present

## 2016-12-10 DIAGNOSIS — Z9181 History of falling: Secondary | ICD-10-CM | POA: Diagnosis not present

## 2016-12-10 DIAGNOSIS — J449 Chronic obstructive pulmonary disease, unspecified: Secondary | ICD-10-CM | POA: Diagnosis not present

## 2016-12-10 DIAGNOSIS — I2510 Atherosclerotic heart disease of native coronary artery without angina pectoris: Secondary | ICD-10-CM | POA: Diagnosis not present

## 2016-12-10 DIAGNOSIS — E785 Hyperlipidemia, unspecified: Secondary | ICD-10-CM | POA: Diagnosis not present

## 2016-12-10 DIAGNOSIS — I130 Hypertensive heart and chronic kidney disease with heart failure and stage 1 through stage 4 chronic kidney disease, or unspecified chronic kidney disease: Secondary | ICD-10-CM | POA: Diagnosis not present

## 2016-12-10 DIAGNOSIS — N183 Chronic kidney disease, stage 3 (moderate): Secondary | ICD-10-CM | POA: Diagnosis not present

## 2016-12-10 DIAGNOSIS — Z7982 Long term (current) use of aspirin: Secondary | ICD-10-CM | POA: Diagnosis not present

## 2016-12-10 DIAGNOSIS — F419 Anxiety disorder, unspecified: Secondary | ICD-10-CM | POA: Diagnosis not present

## 2016-12-10 DIAGNOSIS — E1122 Type 2 diabetes mellitus with diabetic chronic kidney disease: Secondary | ICD-10-CM | POA: Diagnosis not present

## 2016-12-10 DIAGNOSIS — J9611 Chronic respiratory failure with hypoxia: Secondary | ICD-10-CM | POA: Diagnosis not present

## 2016-12-11 ENCOUNTER — Telehealth: Payer: Self-pay | Admitting: *Deleted

## 2016-12-11 ENCOUNTER — Telehealth: Payer: Self-pay | Admitting: Family Medicine

## 2016-12-11 ENCOUNTER — Telehealth: Payer: Self-pay | Admitting: Medical Oncology

## 2016-12-11 ENCOUNTER — Other Ambulatory Visit: Payer: Self-pay | Admitting: *Deleted

## 2016-12-11 LAB — PREPARE PLATELET PHERESIS: UNIT DIVISION: 0

## 2016-12-11 LAB — BPAM PLATELET PHERESIS
BLOOD PRODUCT EXPIRATION DATE: 201808202359
ISSUE DATE / TIME: 201808181324
Unit Type and Rh: 6200

## 2016-12-11 NOTE — Telephone Encounter (Signed)
Pt discharged from [facility] on 12/10/15 . Pt has been scheduled for a HFU

## 2016-12-11 NOTE — Telephone Encounter (Signed)
TCM completed and scheduled.

## 2016-12-11 NOTE — Patient Outreach (Signed)
Unsuccessful telephone encounter to Kevin Gutierrez, 66 year old male, follow up on referral from [facility] hospital liaison RN CM received  12/07/16 for Community CM services/transition of care/recent hospitalization August 12-18,2018 for acute on chronic CHF.  Pt's history includes but not limited to Essential hypertension, Chronic kidney disease stage 3, CAD, small cell lung cancer/upper lobe, DM 2.   HIPAA compliant voice message left with contact name and number on spouse's mobile number - contact for pt's post transition of care calls.   Spouse Kevin Gutierrez on [facility] consent form.    Plan:  If no response to voice message left, plan to follow up again tomorrow telephonically.    Zara Chess.   [facility]  [phone]

## 2016-12-11 NOTE — Telephone Encounter (Signed)
   Patient wife concerned because a CBC was to be run today but no orders in for re-check on platelet count or white blood cell,  Patient was to have a repeat CBC today per discharge notes check white blood cell count and platelet count , needs order for [facility] due to patient has Picc line and would have to be accessed at [facility]. OK to put ion CBC for [facility]?

## 2016-12-11 NOTE — Telephone Encounter (Signed)
Transition Care Management Follow-up Telephone Call  How have you been since you were released from the hospital? Feeling pretty good since discharge. Patient thrush is responding to medication and feels better.   Do you understand why you were in the hospital? Yes   Do you understand the discharge instrcutions? Yes  Items Reviewed:  Medications reviewed: Yes  Allergies reviewed: Yes  Dietary changes reviewed:Yes  Referrals reviewed:Yes   Functional Questionnaire:   Activities of Daily Living (ADLs):   He states they are independent in the following: weak but is able to perform ADLs. States they require assistance with the following: walking not stable on feet.   Any transportation issues/concerns?: No   Any patient concerns?  Yes, Patient was to have a repeat CBC today to check white blood cell count and platelet count , needs order for [facility] due to patient has Picc line and would have to be accessed at [facility].   Confirmed importance and date/time of follow-up visits scheduled: Yes   Confirmed with patient if condition begins to worsen call PCP or go to the ER.  Patient was given the Call-a-Nurse line [phone]: yes

## 2016-12-11 NOTE — Telephone Encounter (Signed)
Lab appt confirmed with wife

## 2016-12-12 ENCOUNTER — Other Ambulatory Visit: Payer: Self-pay | Admitting: *Deleted

## 2016-12-12 ENCOUNTER — Other Ambulatory Visit: Payer: Self-pay | Admitting: Radiology

## 2016-12-12 ENCOUNTER — Ambulatory Visit (HOSPITAL_BASED_OUTPATIENT_CLINIC_OR_DEPARTMENT_OTHER): Payer: PPO

## 2016-12-12 ENCOUNTER — Telehealth: Payer: Self-pay | Admitting: Internal Medicine

## 2016-12-12 ENCOUNTER — Other Ambulatory Visit: Payer: Self-pay | Admitting: Student

## 2016-12-12 ENCOUNTER — Encounter: Payer: Self-pay | Admitting: *Deleted

## 2016-12-12 ENCOUNTER — Ambulatory Visit: Payer: PPO

## 2016-12-12 ENCOUNTER — Telehealth: Payer: Self-pay | Admitting: Family Medicine

## 2016-12-12 DIAGNOSIS — Z452 Encounter for adjustment and management of vascular access device: Secondary | ICD-10-CM | POA: Diagnosis not present

## 2016-12-12 DIAGNOSIS — C3412 Malignant neoplasm of upper lobe, left bronchus or lung: Secondary | ICD-10-CM | POA: Diagnosis not present

## 2016-12-12 DIAGNOSIS — Z95828 Presence of other vascular implants and grafts: Secondary | ICD-10-CM

## 2016-12-12 LAB — COMPREHENSIVE METABOLIC PANEL
ALT: 72 U/L — ABNORMAL HIGH (ref 0–55)
AST: 64 U/L — AB (ref 5–34)
Albumin: 1.8 g/dL — ABNORMAL LOW (ref 3.5–5.0)
Alkaline Phosphatase: 243 U/L — ABNORMAL HIGH (ref 40–150)
Anion Gap: 9 mEq/L (ref 3–11)
BUN: 54.7 mg/dL — AB (ref 7.0–26.0)
CHLORIDE: 92 meq/L — AB (ref 98–109)
CO2: 35 meq/L — AB (ref 22–29)
Calcium: 8.8 mg/dL (ref 8.4–10.4)
Creatinine: 2.1 mg/dL — ABNORMAL HIGH (ref 0.7–1.3)
EGFR: 31 mL/min/{1.73_m2} — ABNORMAL LOW (ref >90)
Glucose: 232 mg/dl — ABNORMAL HIGH (ref 70–140)
POTASSIUM: 3.4 meq/L — AB (ref 3.5–5.1)
SODIUM: 136 meq/L (ref 136–145)
Total Bilirubin: 5.8 mg/dL (ref 0.20–1.20)
Total Protein: 5.6 g/dL — ABNORMAL LOW (ref 6.4–8.3)

## 2016-12-12 LAB — CBC WITH DIFFERENTIAL/PLATELET
BASO%: 0.4 % (ref 0.0–2.0)
BASOS ABS: 0 10*3/uL (ref 0.0–0.1)
EOS ABS: 0 10*3/uL (ref 0.0–0.5)
EOS%: 0.1 % (ref 0.0–7.0)
HCT: 30.4 % — ABNORMAL LOW (ref 38.4–49.9)
HGB: 10.2 g/dL — ABNORMAL LOW (ref 13.0–17.1)
LYMPH%: 20 % (ref 14.0–49.0)
MCH: 30.9 pg (ref 27.2–33.4)
MCHC: 33.5 g/dL (ref 32.0–36.0)
MCV: 92.3 fL (ref 79.3–98.0)
MONO#: 0.5 10*3/uL (ref 0.1–0.9)
MONO%: 9.2 % (ref 0.0–14.0)
NEUT#: 4 10*3/uL (ref 1.5–6.5)
NEUT%: 70.3 % (ref 39.0–75.0)
Platelets: 30 10*3/uL — ABNORMAL LOW (ref 140–400)
RBC: 3.3 10*6/uL — ABNORMAL LOW (ref 4.20–5.82)
RDW: 17.5 % — AB (ref 11.0–14.6)
WBC: 5.7 10*3/uL (ref 4.0–10.3)
lymph#: 1.1 10*3/uL (ref 0.9–3.3)

## 2016-12-12 LAB — HSV(HERPES SIMPLEX VRS) I + II AB-IGM: HSVI/II Comb IgM: <0.91 Ratio (ref 0.00–0.90)

## 2016-12-12 MED ORDER — SODIUM CHLORIDE 0.9% FLUSH
10.0000 mL | INTRAVENOUS | Status: DC | PRN
  Administered 2016-12-12: 10 mL via INTRAVENOUS
  Filled 2016-12-12: qty 10

## 2016-12-12 MED ORDER — HEPARIN SOD (PORK) LOCK FLUSH 100 UNIT/ML IV SOLN
500.0000 [IU] | Freq: Once | INTRAVENOUS | Status: AC
  Administered 2016-12-12: 500 [IU] via INTRAVENOUS
  Filled 2016-12-12: qty 5

## 2016-12-12 NOTE — Telephone Encounter (Signed)
Estill Bamberg from [facility] called requesting verbal orders for nursing for edema and lung cancer disease management, 2x 8. Please advise, thank you!  Call [facility] @ [phone]

## 2016-12-12 NOTE — Telephone Encounter (Signed)
Called back and gave orders as listed below to [place] to [facility].

## 2016-12-12 NOTE — Patient Outreach (Addendum)
3:08 pm  Received a return phone call from spouse [name] in response to voice message left earlier by RN CM, HIPAA identifiers verified on pt, discussed purpose of earlier phone call attempts- follow up on referral received from [facility] hospital liaison RN CM (12/08/16) for Community CM services/transition of care/ recent hospitalization.   Spouse reports pt is weak, had lab work today- numbers are down/liver and kidney numbers up.  Spouse reports trying to do a happy medium for pt keeping him hydrated, pt dealing with thrush/mucocitis/coming from chemo/second round to start 8/28.  Spouse reports everything pt eats has to be ice cold plus give pain medication before he eats.   Spouse reports pt to have port placed tomorrow plus to follow up with PCP. Spouse reports pt weighing every day/recording/today 188 lbs, at discharge was  209 lbs, has  edema in bilateral legs/left more/ no complaints of sob/continues on 3 Liters [place].   RN CM discussed with pt calling MD for weight gain of 3 lbs in a day, 5 lbs in a week to which she was aware.   Spouse reports [facility] home [facility] opened pt's case.  Spouse reports pt taking all of his medications, she is administering  * Patient was recently discharged from hospital and all medications have been reviewed.   RN CM discussed with spouse [facility] transition of care program- follow 31 days (weekly calls, a home visit), benefit of pt's insurance/no cost.    Plan:  As discussed with spouse, plan to continue to follow pt for transition of care- follow up again next week telephonically, the following week- do an initial home visit.            Barrier letter sent to Dr. Lacinda Axon informing of [facility] involvement.    Zara Chess.   [facility]  [phone]  Addendum-  Care plan completed from earlier transition of care call.    Zara Chess.   [place] [facility]  [phone]

## 2016-12-12 NOTE — Telephone Encounter (Signed)
Spoke with patient regarding updated lab and flush appts with his infusion.   Told him to come by scheduling today when he is in for his lab and flush.

## 2016-12-12 NOTE — Patient Outreach (Signed)
3:02 pm- Second unsuccessful telephone encounter for Kevin Gutierrez, 66 year old male, follow up on referral from [facility] hospital liaison RN CM received 12/07/16 for Community CM services/transition of care/recent hospitalization August 12-18,2018 for acute on chronic CHF.   HIPAA compliant voice message left on spouse [name] mobile number (contact for pt's transition of care calls, on consent) with contact name and number.    Plan:  If no response, plan to follow up again tomorrow telephonically.   Zara Chess.   [facility]  [phone]

## 2016-12-12 NOTE — Telephone Encounter (Signed)
Okay for verbal 

## 2016-12-13 ENCOUNTER — Encounter: Payer: Self-pay | Admitting: Family Medicine

## 2016-12-13 ENCOUNTER — Telehealth: Payer: Self-pay | Admitting: Medical Oncology

## 2016-12-13 ENCOUNTER — Ambulatory Visit (HOSPITAL_COMMUNITY): Admission: RE | Admit: 2016-12-13 | Payer: PPO | Source: Ambulatory Visit

## 2016-12-13 ENCOUNTER — Telehealth: Payer: Self-pay | Admitting: *Deleted

## 2016-12-13 ENCOUNTER — Ambulatory Visit (INDEPENDENT_AMBULATORY_CARE_PROVIDER_SITE_OTHER): Payer: PPO | Admitting: Family Medicine

## 2016-12-13 DIAGNOSIS — Z7189 Other specified counseling: Secondary | ICD-10-CM | POA: Diagnosis not present

## 2016-12-13 DIAGNOSIS — I5043 Acute on chronic combined systolic (congestive) and diastolic (congestive) heart failure: Secondary | ICD-10-CM

## 2016-12-13 DIAGNOSIS — C3490 Malignant neoplasm of unspecified part of unspecified bronchus or lung: Secondary | ICD-10-CM | POA: Diagnosis not present

## 2016-12-13 MED ORDER — MAGIC MOUTHWASH W/LIDOCAINE: 15.0000 mL | Freq: Four times a day (QID) | ORAL | 3 refills | Status: AC

## 2016-12-13 MED ORDER — FLUTICASONE FUROATE-VILANTEROL 200-25 MCG/INH IN AEPB: 1.0000 | INHALATION_SPRAY | Freq: Every day | RESPIRATORY_TRACT | Status: AC

## 2016-12-13 NOTE — Telephone Encounter (Addendum)
Wife thinks pt may be going to hospice or palliative care. He is seeing Dr Lacinda Axon now  And he recommends hospice. She said pt cannot go through another "bout of mucositis". She also asked me to cancel his port procedure for today . Left message with IR.

## 2016-12-13 NOTE — Telephone Encounter (Signed)
Pt's wife has requested to have Hospice Started  [place] [phone]

## 2016-12-13 NOTE — Patient Instructions (Addendum)
Check his BP regularly.  Sugar once a day. No more insulin.  Hold the Hydralazine.  Give me a ring and let me know.  Take care  Dr. Lacinda Axon

## 2016-12-14 ENCOUNTER — Telehealth: Payer: Self-pay | Admitting: Medical Oncology

## 2016-12-14 NOTE — Telephone Encounter (Signed)
Pt referred to [facility] yesterday by PCP.

## 2016-12-14 NOTE — Telephone Encounter (Signed)
Will set up.

## 2016-12-14 NOTE — Assessment & Plan Note (Signed)
Recent hospital admission. Medications reviewed and reconciled today. Patient is not taking Lasix. Holding Bidil in setting of low BP's. Patient is to continue his Lopressor and spironolactone. We had a lengthy discussion today about his ongoing care. See goals of care discussion.

## 2016-12-14 NOTE — Progress Notes (Signed)
Subjective:  Patient ID: Kevin Gutierrez, male    DOB: 1950-12-30  Age: 66 y.o. MRN: [id number]  CC: Hospital follow up  HPI:  66 year old male with an extensive past medical history including chronic respiratory failure/advanced COPD, chronic systolic CHF, coronary artery disease, DM 2, hypertension, hyperlipidemia, recent diagnosis of small cell lung cancer presents for hospital follow-up.  Hospital course reviewed and summarized as follows:  Patient recently admitted from 8/12-8/18. Patient presented with weight gain increase in abdominal girth and leg swelling as well as fatigue and hypotension. Weight was about 10 pounds. BMP elevated. Troponin elevated. Admitted for acute on chronic systolic heart failure as well as NSTEMI. Patient was diuresed. Lasix was subsequently stopped and was discontinued on discharge due to elevated creatinine. Patient neutropenic and thrombus had a panic during admission as well. Neutropenia resolved prior to discharge, while thrombus cytopenia persisted. This is secondary to chemotherapy. Patient found to have mucositis during admission as well. Treated with Carafate, nystatin, and Magic mouthwash. Also found to have thrush and was treated with Diflucan.  Patient presents today for follow-up. Patient is doing poorly. We can fatigue. Does not look well. Wife is concerned about low blood pressures. She has been holding his Bidil. She has continued his metoprolol as well as spironolactone. She is concerned about his hypotension and wants to discuss this today. She wants to reconcile his medications. Blood sugars elevated. Will discuss today.   Social Hx   Social History   Social History  . Marital status: Married    Spouse name: N/A  . Number of children: N/A  . Years of education: N/A   Occupational History  . retired    Social History Main Topics  . Smoking status: Current Every Day Smoker    Packs/day: 0.50    Years: 50.00    Types: Cigarettes  .  Smokeless tobacco: Never Used     Comment: Currently smoking 5 cigarettes daily  . Alcohol use No  . Drug use: Yes    Types: Marijuana     Comment: Occasional marijuana use  . Sexual activity: Yes   Other Topics Concern  . None   Social History Narrative   Lives with family    Review of Systems  Constitutional: Positive for fatigue.  HENT:       Oral pain.   Respiratory: Positive for shortness of breath.   Neurological: Positive for weakness.   Objective:  BP 100/78 (BP Location: Left Arm, Patient Position: Sitting, Cuff Size: Normal)   Pulse 79   Temp 97.8 F (36.6 C)   Resp 16   Wt 174 lb 8 oz (79.2 kg)   SpO2 (!) 80% Comment: 6 liters O2  BMI 23.67 kg/m   BP/Weight 12/13/2016 12/09/2016 3/66/4403  Systolic BP 474 96 259  Diastolic BP 78 72 69  Wt. (Lbs) 174.5 196.87 -  BMI 23.67 26.7 -    Physical Exam  Constitutional:  Chronically ill-appearing male, sitting in wheelchair, nasal cannula oxygen in place. Looks lethargic and not well. In no apparent distress.  Cardiovascular: Normal rate and regular rhythm.   Pulmonary/Chest:  No acute respiratory distress.  Abdominal: Soft.  Protuberant abdomen. Nontender.  Neurological: He is alert.  Psychiatric:  Flat affect.  Vitals reviewed.   Lab Results  Component Value Date   WBC 5.7 12/12/2016   HGB 10.2 (L) 12/12/2016   HCT 30.4 (L) 12/12/2016   PLT 30 (L) 12/12/2016   GLUCOSE 232 (H) 12/12/2016   CHOL  122 11/06/2016   TRIG 48.0 11/15/2015   HDL 37.40 (L) 11/15/2015   LDLCALC 52 11/15/2015   ALT 72 (H) 12/12/2016   AST 64 (H) 12/12/2016   NA 136 12/12/2016   K 3.4 (L) 12/12/2016   CL 95 (L) 12/09/2016   CREATININE 2.1 (H) 12/12/2016   BUN 54.7 (H) 12/12/2016   CO2 35 (H) 12/12/2016   PSA 0.71 05/17/2015   INR 0.99 05/04/2015   HGBA1C 7.1 (H) 11/07/2016    Assessment & Plan:   Problem List Items Addressed This Visit    Goals of care, counseling/discussion    In light of his extensive  comorbidities and recent admission for CHF, I did not feel that he is going to do well with chemotherapy and treatment for his lung cancer. He has a very poor prognosis. We had a lengthy discussion today about aggressive treatment versus palliation. I recommended palliation as I feel that his quality of life is not going to be very good as he continues to battle his cancer and other comorbidities. Patient seems to be in agreement. He and his wife will discuss at home and let me know how they want to proceed.      Acute on chronic combined systolic and diastolic CHF (congestive heart failure) ([place])    Recent hospital admission. Medications reviewed and reconciled today. Patient is not taking Lasix. Holding Bidil in setting of low BP's. Patient is to continue his Lopressor and spironolactone. We had a lengthy discussion today about his ongoing care. See goals of care discussion.         Meds ordered this encounter  Medications  . fluticasone furoate-vilanterol (BREO ELLIPTA) 200-25 MCG/INH AEPB    Sig: Inhale 1 puff into the lungs daily.    Dispense:  1 each  . magic mouthwash w/lidocaine SOLN    Sig: Take 15 mLs by mouth 4 (four) times daily.    Dispense:  100 mL    Refill:  3    Follow-up: PRN  [place]

## 2016-12-14 NOTE — Telephone Encounter (Addendum)
[place] will be started .  She stated the will start pallative care first. She wants to thank  you for all the care you have provided .

## 2016-12-14 NOTE — Telephone Encounter (Signed)
Referral faxed to hospice

## 2016-12-14 NOTE — Assessment & Plan Note (Signed)
In light of his extensive comorbidities and recent admission for CHF, I did not feel that he is going to do well with chemotherapy and treatment for his lung cancer. He has a very poor prognosis. We had a lengthy discussion today about aggressive treatment versus palliation. I recommended palliation as I feel that his quality of life is not going to be very good as he continues to battle his cancer and other comorbidities. Patient seems to be in agreement. He and his wife will discuss at home and let me know how they want to proceed.

## 2016-12-17 DIAGNOSIS — R0602 Shortness of breath: Secondary | ICD-10-CM | POA: Diagnosis not present

## 2016-12-17 DIAGNOSIS — J449 Chronic obstructive pulmonary disease, unspecified: Secondary | ICD-10-CM | POA: Diagnosis not present

## 2016-12-17 DIAGNOSIS — R600 Localized edema: Secondary | ICD-10-CM | POA: Diagnosis not present

## 2016-12-17 DIAGNOSIS — J9611 Chronic respiratory failure with hypoxia: Secondary | ICD-10-CM | POA: Diagnosis not present

## 2016-12-19 ENCOUNTER — Other Ambulatory Visit: Payer: PPO

## 2016-12-19 ENCOUNTER — Ambulatory Visit: Payer: PPO | Admitting: Internal Medicine

## 2016-12-19 ENCOUNTER — Ambulatory Visit: Payer: PPO

## 2016-12-19 NOTE — Progress Notes (Signed)
Nutrition  Was planning nutrition follow-up during infusion today but noted hospice referral.  Please re-consult if needed.  Kenya Shiraishi B. Zenia Resides, [place], [place] Registered Dietitian [phone] (pager)

## 2016-12-20 ENCOUNTER — Other Ambulatory Visit: Payer: Self-pay | Admitting: *Deleted

## 2016-12-20 ENCOUNTER — Ambulatory Visit: Payer: PPO

## 2016-12-20 ENCOUNTER — Encounter: Payer: Self-pay | Admitting: *Deleted

## 2016-12-20 NOTE — Patient Outreach (Signed)
Successful telephone encounter to Kevin Gutierrez, 67 year old male for transition of care, ongoing follow up on recent hospitalization August 12-18,2018 for acute on chronic CHF.  Spoke with pt, HIPAA identifiers verified, discussed purpose of call.  Pt reports everything is the  same, back to where he can eat now.  Pt reports weights are the same.  RN CM inquired about  sugars to which pt reports talking to wrong person, need to talk to spouse/spouse Kevin Gutierrez got on the phone who reports pt has a little edema/starting to get strength back.  Spouse reports pt  followed up with Dr. Lacinda Axon post discharge, talked about his current health,choose Hospice which   started  12/15/16, nurse and Social worker came out yesterday.   Spouse reports in process of  getting equipment transitioned from [facility] to hospice resources.   RN CM discussed with spouse with Hospice coming in, [facility] to close case.     Plan:  As discussed with spouse, plan to to close case due to pt receiving Hospice services.            Plan to inform Dr. Lacinda Axon of discharge from [facility] CM services- send case closure letter.            Plan to inform [facility] care management assistant to close case.    Zara Chess.   [facility]  [phone]

## 2016-12-21 ENCOUNTER — Ambulatory Visit: Payer: PPO

## 2016-12-21 ENCOUNTER — Other Ambulatory Visit: Payer: PPO

## 2016-12-22 ENCOUNTER — Ambulatory Visit: Payer: PPO | Admitting: Cardiovascular Disease

## 2016-12-23 ENCOUNTER — Ambulatory Visit: Payer: PPO

## 2016-12-23 DIAGNOSIS — J439 Emphysema, unspecified: Secondary | ICD-10-CM | POA: Diagnosis not present

## 2016-12-23 DIAGNOSIS — J449 Chronic obstructive pulmonary disease, unspecified: Secondary | ICD-10-CM | POA: Diagnosis not present

## 2016-12-26 ENCOUNTER — Other Ambulatory Visit: Payer: PPO

## 2016-12-27 ENCOUNTER — Other Ambulatory Visit: Payer: Self-pay | Admitting: Family Medicine

## 2016-12-27 NOTE — Telephone Encounter (Signed)
Patient under Hospice care

## 2016-12-27 NOTE — Telephone Encounter (Signed)
Last OV 12/13/2016 Next OV not scheduled Last refill 11/02/2016

## 2016-12-28 ENCOUNTER — Ambulatory Visit: Payer: Self-pay | Admitting: *Deleted

## 2016-12-28 NOTE — Telephone Encounter (Signed)
Script faxed.

## 2016-12-29 ENCOUNTER — Ambulatory Visit
Admission: RE | Admit: 2016-12-29 | Discharge: 2016-12-29 | Disposition: A | Discharge status: Home / Self Care | Payer: PPO | Source: Ambulatory Visit | Attending: Radiation Oncology | Admitting: Radiation Oncology

## 2016-12-29 DIAGNOSIS — C3412 Malignant neoplasm of upper lobe, left bronchus or lung: Secondary | ICD-10-CM

## 2017-01-01 ENCOUNTER — Encounter: Payer: Self-pay | Admitting: Family Medicine

## 2017-01-04 ENCOUNTER — Ambulatory Visit: Payer: Self-pay | Admitting: *Deleted

## 2017-01-08 ENCOUNTER — Telehealth: Payer: Self-pay | Admitting: Family Medicine

## 2017-01-12 ENCOUNTER — Ambulatory Visit: Payer: Self-pay | Admitting: *Deleted

## 2017-01-18 ENCOUNTER — Telehealth: Payer: Self-pay | Admitting: *Deleted

## 2017-01-18 NOTE — Telephone Encounter (Signed)
Patient is deceased. I ordered it due to concern for CHF (which he had before he passed).

## 2017-01-18 NOTE — Telephone Encounter (Signed)
April from [facility] has requested a call to discuss the reasoning for a Eco that was ordered back in March. Pt's insurance did not cover

## 2017-01-21 NOTE — Addendum Note (Signed)
Encounter addended by: Tyler Pita, MD on: 01/21/2017  4:19 PM<BR>    Actions taken: Sign clinical note

## 2017-01-22 NOTE — Telephone Encounter (Signed)
Leanne from Hospice called and stated that patient expired this morning 9/17 at 7:12 am.

## 2017-01-22 DEATH — deceased

## 2017-01-29 ENCOUNTER — Other Ambulatory Visit: Payer: Self-pay | Admitting: Nurse Practitioner

## 2017-06-03 ENCOUNTER — Other Ambulatory Visit: Payer: Self-pay | Admitting: Family Medicine

## 2017-07-04 ENCOUNTER — Ambulatory Visit: Payer: PPO

## 2019-05-28 IMAGING — CR DG CHEST 2V
2 series · 2 of 2 positions shown · non-contrast
Comparison: Chest radiograph 03/19/2016.

CLINICAL DATA: Patient with history of COPD.  Shortness of breath.

EXAM:
CHEST  2 VIEW

[chest pa]
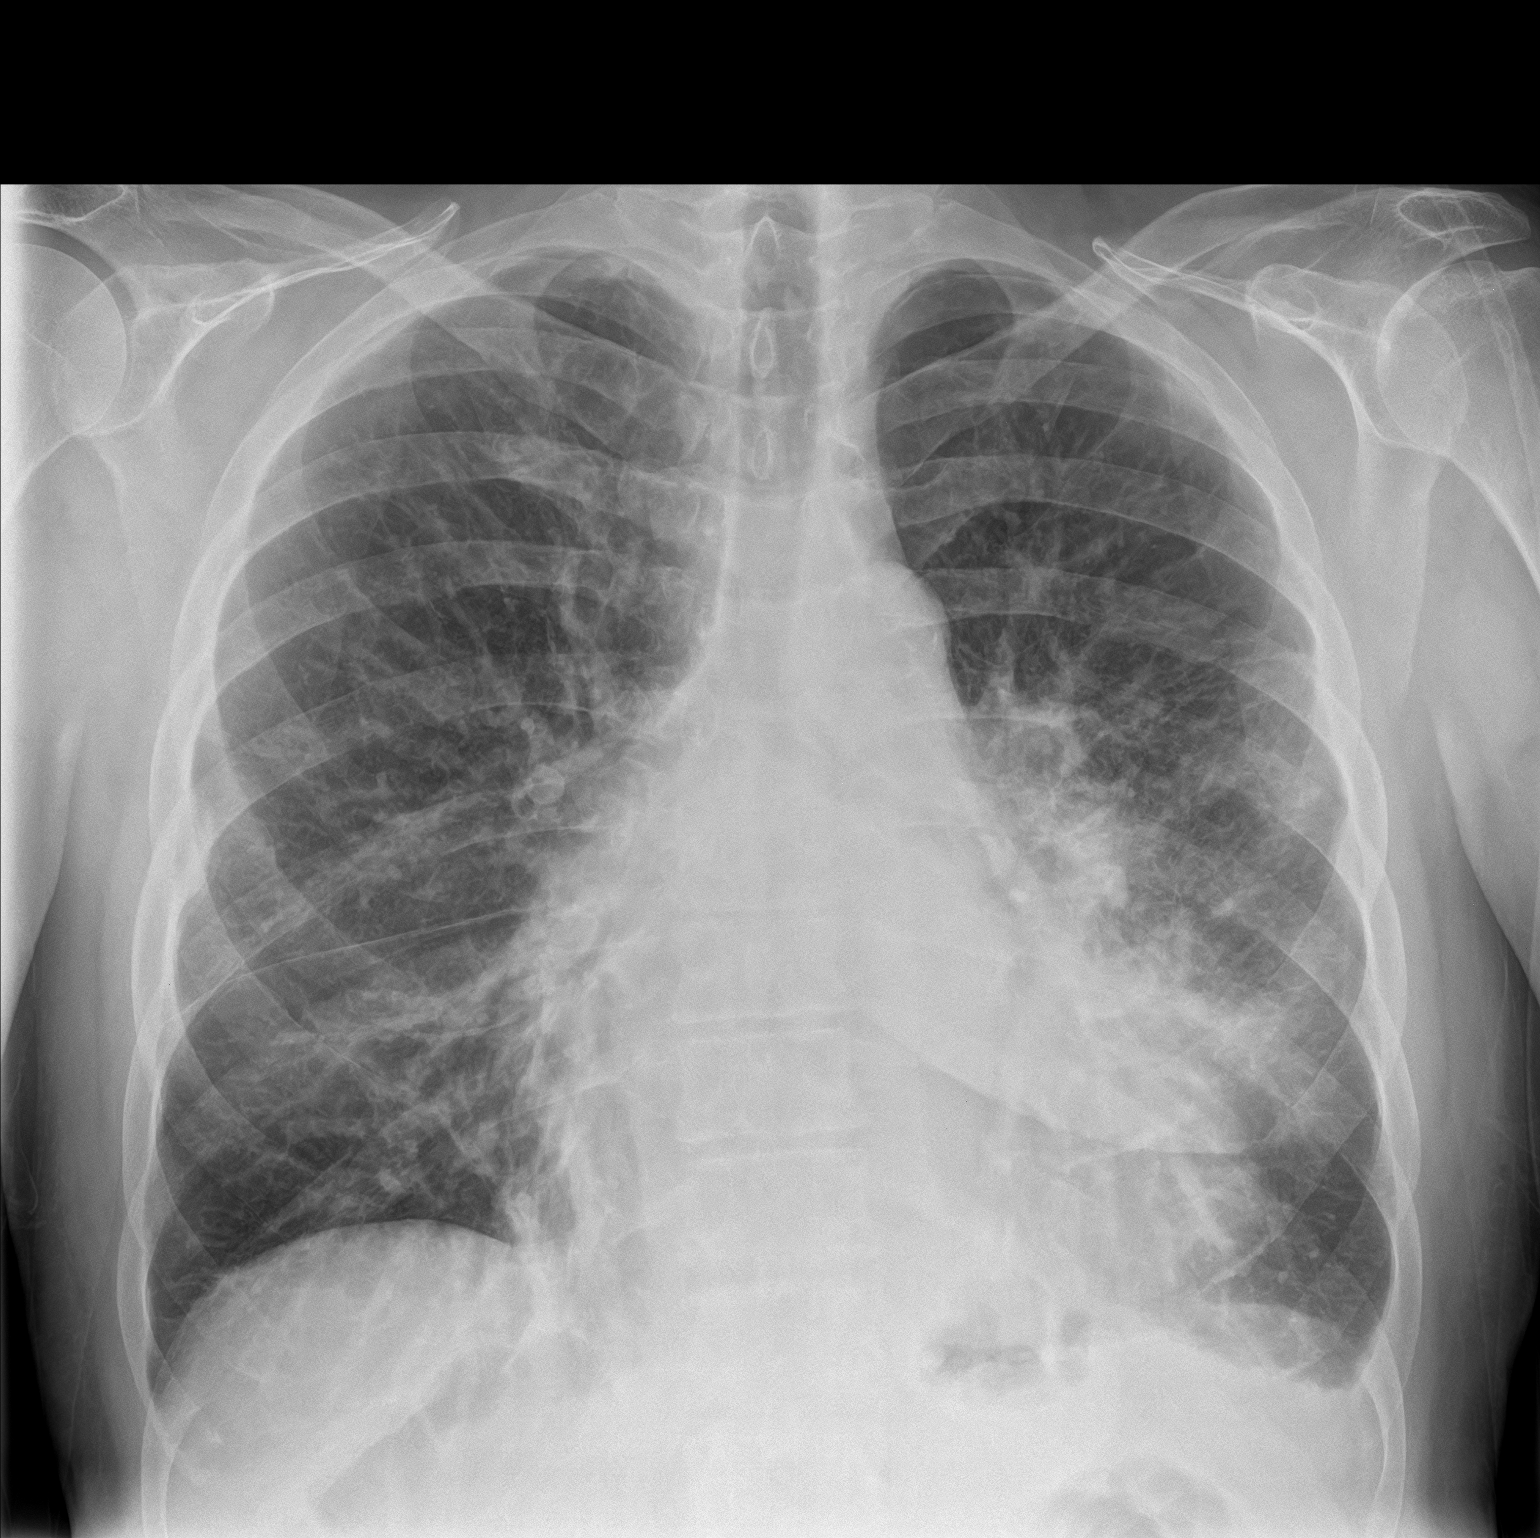

[chest lat]
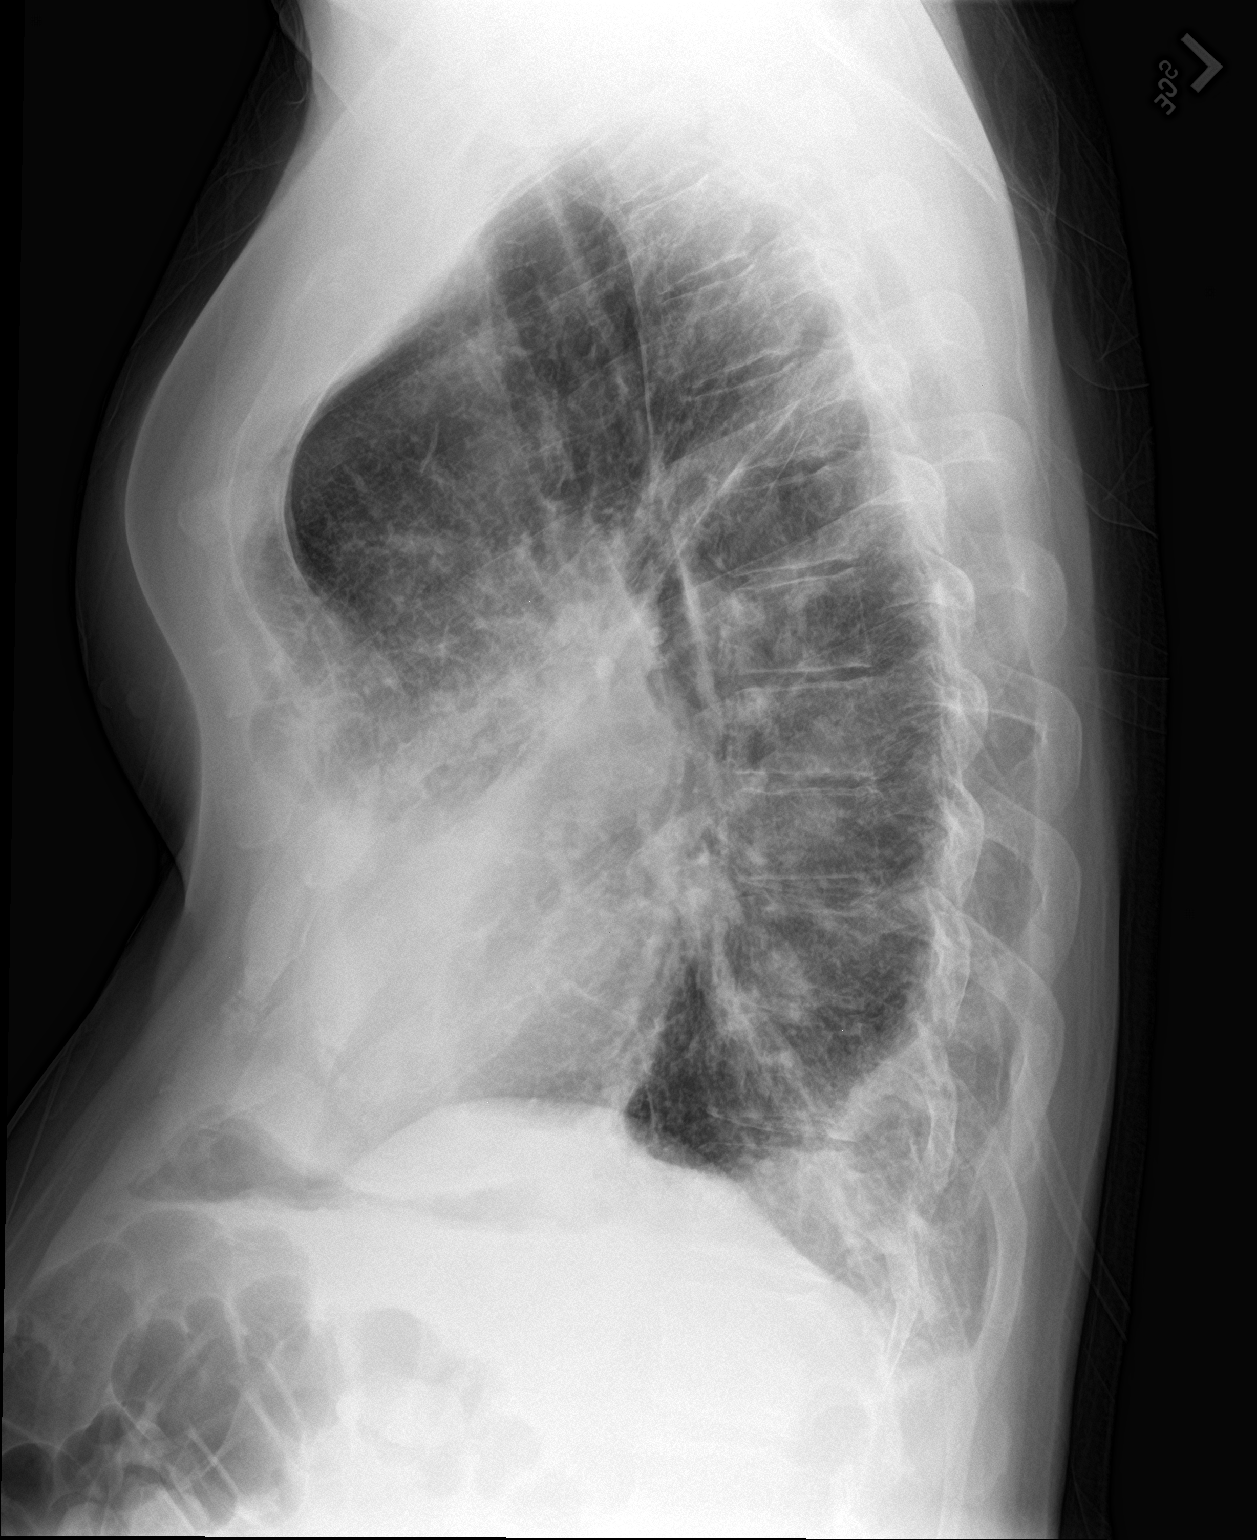

[2 of 2 positions shown; findings below may reference images not displayed]

FINDINGS: Stable cardiomegaly. Interval development of left perihilar and left
lower lung consolidation. Small left pleural effusion. No
pneumothorax. Thoracic spine degenerative changes. Pectus excavatum.
IMPRESSION: Interval development of extensive consolidation within the left mid
and lower lung concerning for pneumonia in the appropriate clinical
setting. Followup PA and lateral chest X-ray is recommended in 3-4
weeks following trial of antibiotic therapy to ensure resolution and
exclude underlying malignancy.

Small left pleural effusion.

## 2019-06-05 IMAGING — CT CT CHEST W/O CM
2 of 4 series · 15 of 36 positions shown, 18 images · non-contrast
Comparison: 11/06/2016 chest x-ray. 11/05/2016 and 06/07/2015 chest
CT.

CLINICAL DATA: 65-year-old male with progressive dyspnea. Abnormal
chest x-ray and CT. Subsequent encounter.

EXAM:
CT CHEST WITHOUT CONTRAST
TECHNIQUE: Multidetector CT imaging of the chest was performed following the
standard protocol without IV contrast.

[Series 4: thorax 2.0 · axial · 0.87mm/px · z∈[+1076,+1406]mm · 12 of 193 slices shown, 15 images]
[im 14/193  mediastinal]
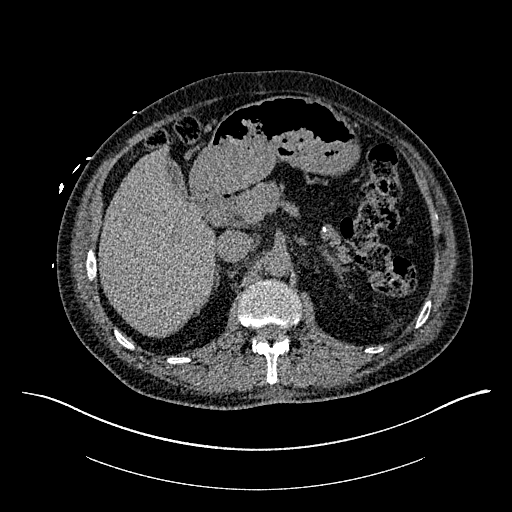
[im 14/193  lung]
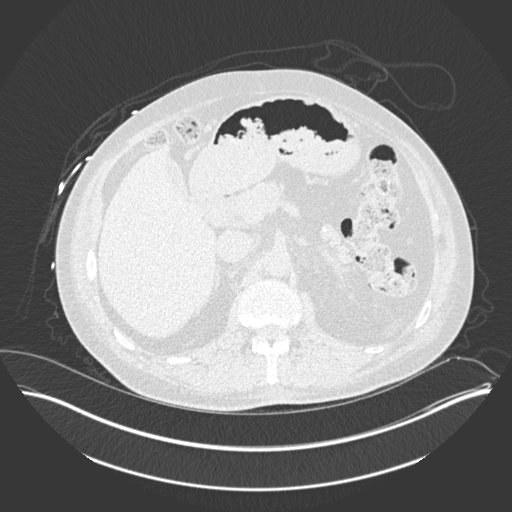
[im 28/193  lung]
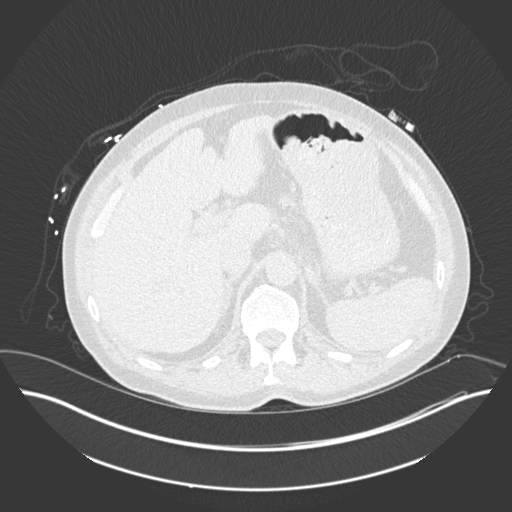
[im 42/193  lung]
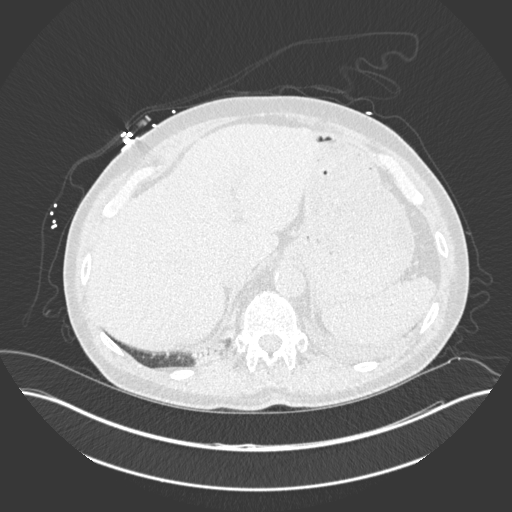
[im 55/193  lung]
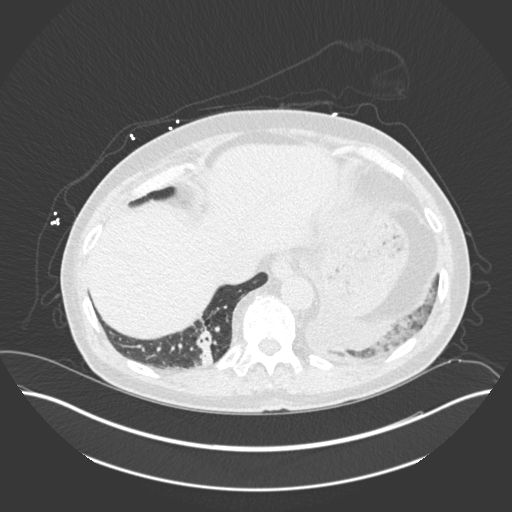
[im 69/193  mediastinal]
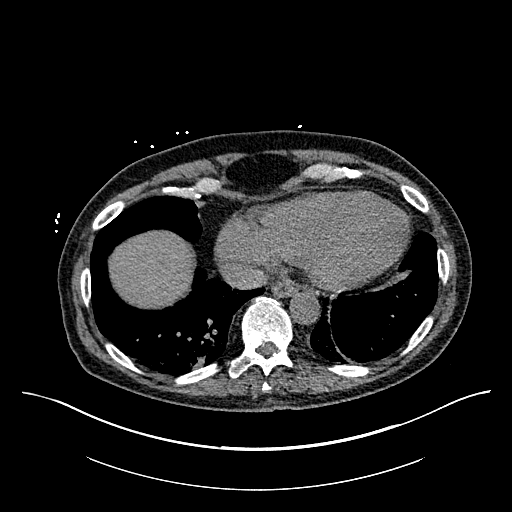
[im 69/193  lung]
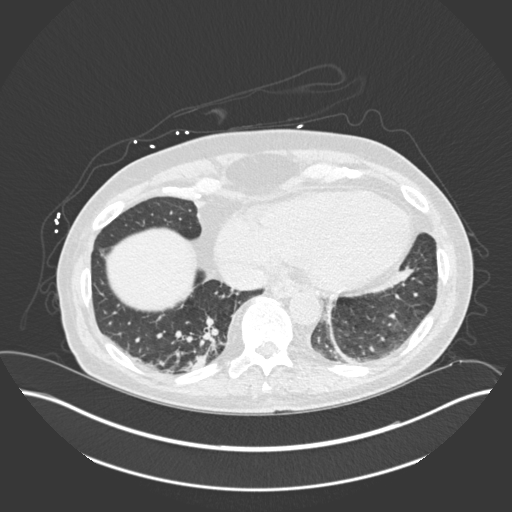
[im 83/193  lung]
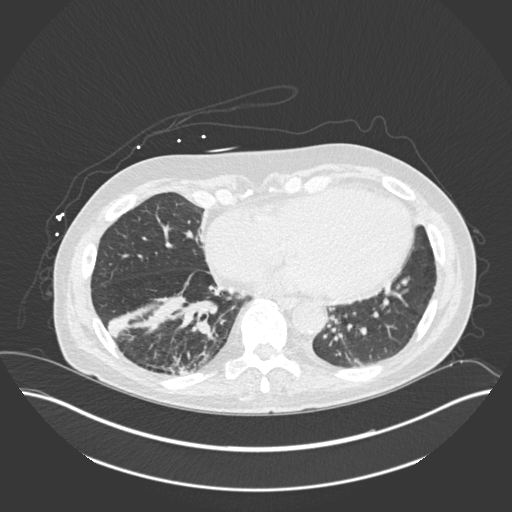
[im 110/193  lung]
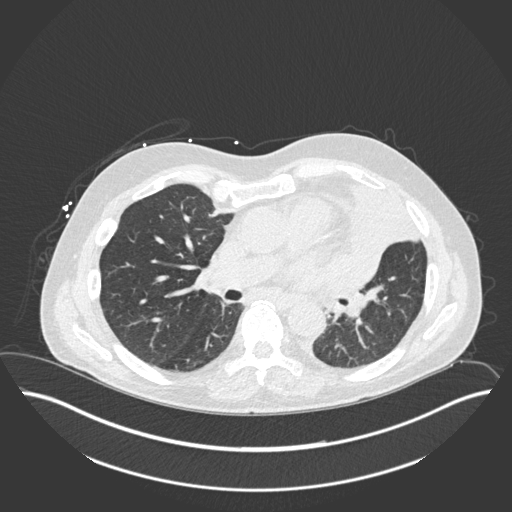
[im 124/193  lung]
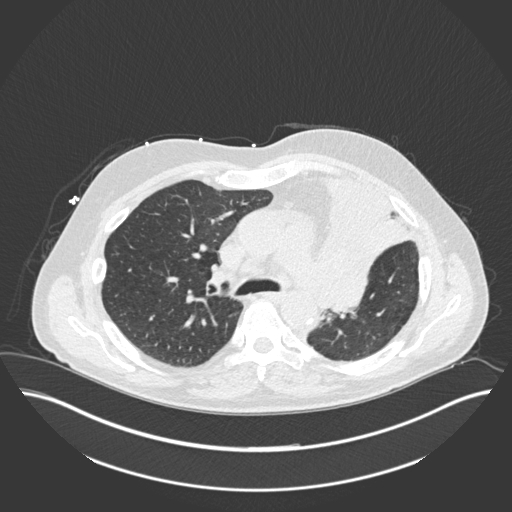
[im 138/193  mediastinal]
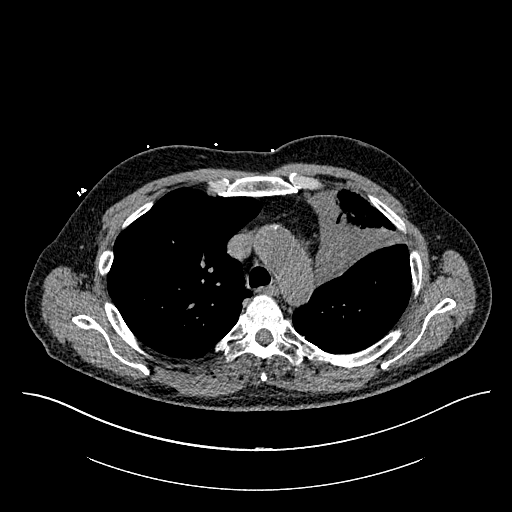
[im 138/193  lung]
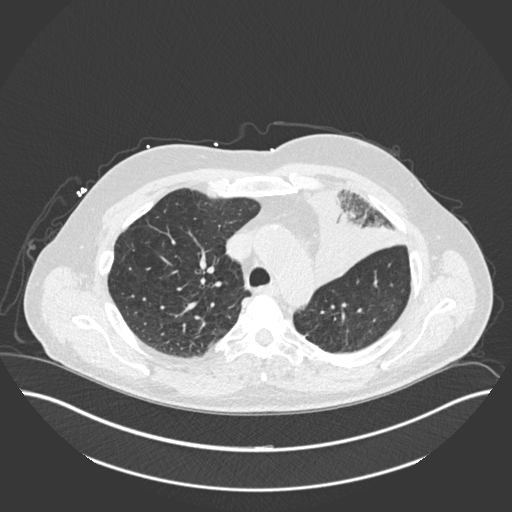
[im 151/193  lung]
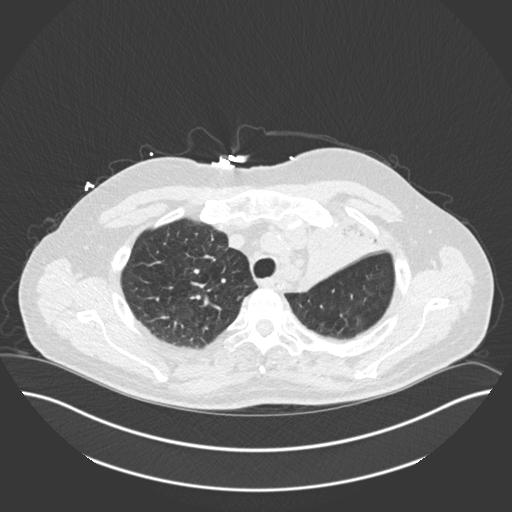
[im 165/193  lung]
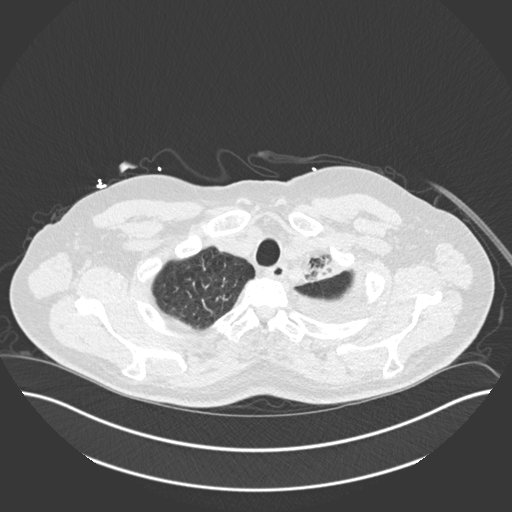
[im 179/193  lung]
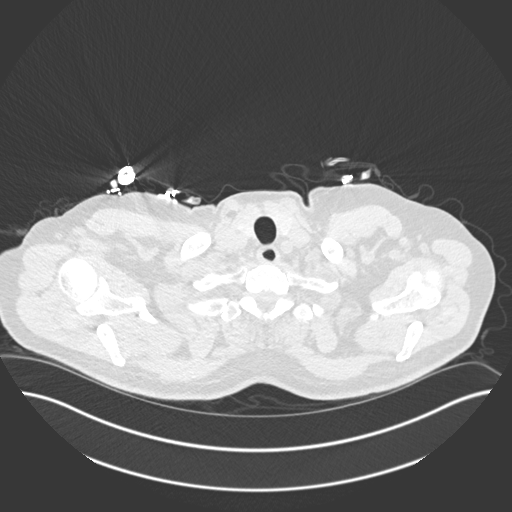

[Series 7: coronal · coronal · 0.75mm/px · 3 of 101 slices shown]
[im 21/101  lung]
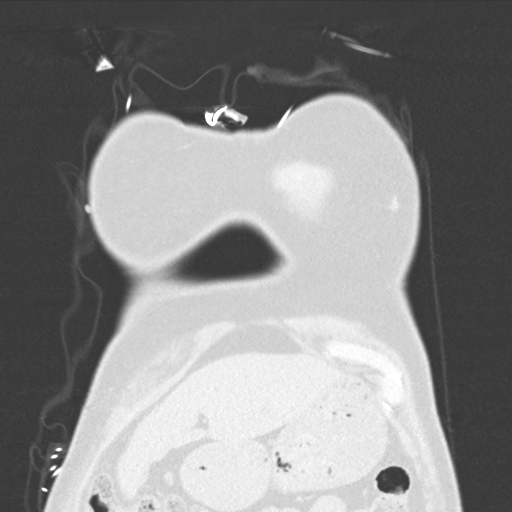
[im 41/101  lung]
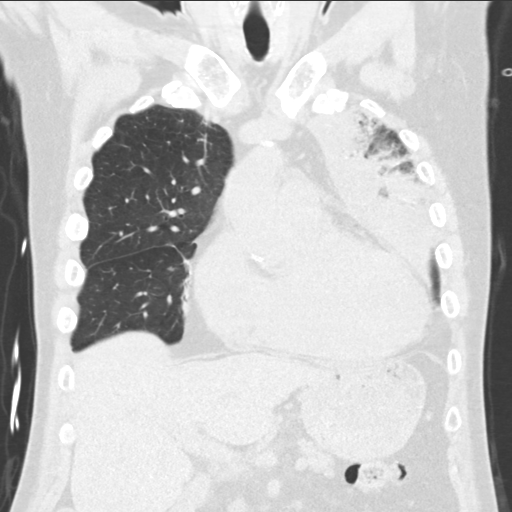
[im 61/101  lung]
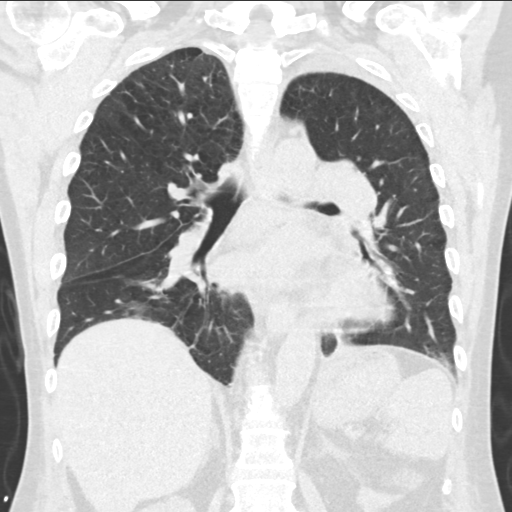

[15 of 36 positions shown; findings below may reference images not displayed]

FINDINGS: Cardiovascular: Pectus deformity with cardiomegaly. Coronary artery
calcifications. Atherosclerotic changes thoracic aorta. Ascending
thoracic aorta measures up to 4 Cm.

Mediastinum/Nodes: Mediastinal adenopathy.

Lungs/Pleura: Obstruction left upper lobe bronchus with
postobstructive atelectasis/ infiltrate. Rounded fullness left hilar
region raises possibly of obstructing mass (not well delineated on
present noncontrast exam). The presence of adjacent mediastinal
raises possibility of tumor.

Atelectasis with bronchial thickening involving portions of the
lower lobes bilaterally and medial aspect of the right middle lobe.

Very small left-sided pleural effusion.

Upper Abdomen: Slightly lobulated contour of the liver without other
findings of cirrhosis.

Mild nodularity left adrenal gland.

Musculoskeletal: Sclerotic appearance left humeral head suggestive
of avascular necrosis. Pectus deformity. Mild degenerative changes
thoracic spine with destructive lesion.
IMPRESSION: Obstruction left upper lobe bronchus with postobstructive
atelectasis/ infiltrate. Rounded fullness left hilar region raises
possibly of obstructing mass (not well delineated on present
noncontrast exam). The presence of adjacent mediastinal raises
possibility of tumor.

Atelectasis with bronchial thickening involving portions of the
lower lobes bilaterally and medial aspect of the right middle lobe.

Very small left-sided pleural effusion.

Mild nodularity left adrenal gland.

Ascending thoracic aorta measures up to 4 cm. Recommend annual
imaging followup by CTA or MRA. This recommendation follows 0323
ACCF/AHA/AATS/ACR/ASA/SCA/NAZARETH/MIGEL/KRUTI/LASPRILLA Guidelines for the
Diagnosis and Management of Patients with Thoracic Aortic Disease.
Circulation. 0323; 121: [id number]

Sclerotic appearance left humeral head suggestive of avascular
necrosis.

Pectus deformity with mild cardiomegaly.

Aortic Atherosclerosis ([id number]).
